# Patient Record
Sex: Female | Born: 1995 | Race: White | Hispanic: No | Marital: Married | Smoking: Never smoker
Health system: Southern US, Community
[De-identification: ages and names within clinical notes are randomized; demographics above are authoritative.]

## PROBLEM LIST (undated history)

## (undated) ENCOUNTER — Inpatient Hospital Stay: Payer: Self-pay

## (undated) DIAGNOSIS — R519 Headache, unspecified: Secondary | ICD-10-CM

## (undated) DIAGNOSIS — B9689 Other specified bacterial agents as the cause of diseases classified elsewhere: Secondary | ICD-10-CM

## (undated) DIAGNOSIS — R102 Pelvic and perineal pain: Secondary | ICD-10-CM

## (undated) DIAGNOSIS — D649 Anemia, unspecified: Secondary | ICD-10-CM

## (undated) DIAGNOSIS — N83209 Unspecified ovarian cyst, unspecified side: Secondary | ICD-10-CM

## (undated) DIAGNOSIS — G8929 Other chronic pain: Secondary | ICD-10-CM

## (undated) DIAGNOSIS — N941 Unspecified dyspareunia: Secondary | ICD-10-CM

## (undated) DIAGNOSIS — R51 Headache: Secondary | ICD-10-CM

## (undated) DIAGNOSIS — N760 Acute vaginitis: Secondary | ICD-10-CM

## (undated) HISTORY — DX: Unspecified dyspareunia: N94.10

## (undated) HISTORY — PX: WISDOM TOOTH EXTRACTION: SHX21

## (undated) HISTORY — DX: Other specified bacterial agents as the cause of diseases classified elsewhere: N76.0

## (undated) HISTORY — DX: Other specified bacterial agents as the cause of diseases classified elsewhere: B96.89

---

## 2015-05-01 ENCOUNTER — Emergency Department

## 2015-05-01 ENCOUNTER — Encounter: Payer: Self-pay | Admitting: Emergency Medicine

## 2015-05-01 ENCOUNTER — Emergency Department
Admission: EM | Admit: 2015-05-01 | Discharge: 2015-05-01 | Disposition: A | Discharge status: Home / Self Care | Attending: Emergency Medicine | Admitting: Emergency Medicine

## 2015-05-01 DIAGNOSIS — R1030 Lower abdominal pain, unspecified: Secondary | ICD-10-CM | POA: Diagnosis present

## 2015-05-01 DIAGNOSIS — Z3202 Encounter for pregnancy test, result negative: Secondary | ICD-10-CM | POA: Insufficient documentation

## 2015-05-01 DIAGNOSIS — R102 Pelvic and perineal pain: Secondary | ICD-10-CM

## 2015-05-01 DIAGNOSIS — N760 Acute vaginitis: Secondary | ICD-10-CM | POA: Diagnosis not present

## 2015-05-01 DIAGNOSIS — B9689 Other specified bacterial agents as the cause of diseases classified elsewhere: Secondary | ICD-10-CM

## 2015-05-01 HISTORY — DX: Unspecified ovarian cyst, unspecified side: N83.209

## 2015-05-01 HISTORY — DX: Anemia, unspecified: D64.9

## 2015-05-01 LAB — CHLAMYDIA/NGC RT PCR (ARMC ONLY)
Chlamydia Tr: NOT DETECTED
N gonorrhoeae: NOT DETECTED

## 2015-05-01 LAB — COMPREHENSIVE METABOLIC PANEL
ALT: 14 U/L (ref 14–54)
AST: 20 U/L (ref 15–41)
Albumin: 4.6 g/dL (ref 3.5–5.0)
Alkaline Phosphatase: 57 U/L (ref 38–126)
Anion gap: 7 (ref 5–15)
BUN: 10 mg/dL (ref 6–20)
CO2: 27 mmol/L (ref 22–32)
CREATININE: 0.63 mg/dL (ref 0.44–1.00)
Calcium: 9.2 mg/dL (ref 8.9–10.3)
Chloride: 104 mmol/L (ref 101–111)
GFR calc non Af Amer: >60 mL/min (ref >60)
Glucose, Bld: 87 mg/dL (ref 65–99)
Potassium: 3.7 mmol/L (ref 3.5–5.1)
SODIUM: 138 mmol/L (ref 135–145)
Total Bilirubin: 0.8 mg/dL (ref 0.3–1.2)
Total Protein: 7.6 g/dL (ref 6.5–8.1)

## 2015-05-01 LAB — CBC WITH DIFFERENTIAL/PLATELET
Basophils Absolute: 0 10*3/uL (ref 0–0.1)
Basophils Relative: 0 %
EOS ABS: 0.1 10*3/uL (ref 0–0.7)
Eosinophils Relative: 2 %
HCT: 39.5 % (ref 35.0–47.0)
HEMOGLOBIN: 13 g/dL (ref 12.0–16.0)
LYMPHS ABS: 1.7 10*3/uL (ref 1.0–3.6)
LYMPHS PCT: 22 %
MCH: 25.3 pg — AB (ref 26.0–34.0)
MCHC: 32.9 g/dL (ref 32.0–36.0)
MCV: 76.7 fL — AB (ref 80.0–100.0)
Monocytes Absolute: 0.7 10*3/uL (ref 0.2–0.9)
Monocytes Relative: 9 %
NEUTROS ABS: 5.1 10*3/uL (ref 1.4–6.5)
Neutrophils Relative %: 67 %
PLATELETS: 293 10*3/uL (ref 150–440)
RBC: 5.15 MIL/uL (ref 3.80–5.20)
RDW: 14.4 % (ref 11.5–14.5)
WBC: 7.6 10*3/uL (ref 3.6–11.0)

## 2015-05-01 LAB — URINALYSIS COMPLETE WITH MICROSCOPIC (ARMC ONLY)
Bilirubin Urine: NEGATIVE
Glucose, UA: NEGATIVE mg/dL
Hgb urine dipstick: NEGATIVE
Ketones, ur: NEGATIVE mg/dL
Nitrite: NEGATIVE
Protein, ur: NEGATIVE mg/dL
SPECIFIC GRAVITY, URINE: 1.023 (ref 1.005–1.030)
pH: 5 (ref 5.0–8.0)

## 2015-05-01 LAB — POCT PREGNANCY, URINE: PREG TEST UR: NEGATIVE

## 2015-05-01 LAB — WET PREP, GENITAL
Clue Cells Wet Prep HPF POC: NONE SEEN
TRICH WET PREP: NONE SEEN
Yeast Wet Prep HPF POC: NONE SEEN

## 2015-05-01 LAB — LIPASE, BLOOD: LIPASE: 30 U/L (ref 22–51)

## 2015-05-01 MED ORDER — METRONIDAZOLE 500 MG PO TABS: 500.0000 mg | ORAL_TABLET | Freq: Two times a day (BID) | ORAL | Status: DC

## 2015-05-01 NOTE — Discharge Instructions (Signed)
Bacterial Vaginosis Bacterial vaginosis is a vaginal infection that occurs when the normal balance of bacteria in the vagina is disrupted. It results from an overgrowth of certain bacteria. This is the most common vaginal infection in women of childbearing age. Treatment is important to prevent complications, especially in pregnant women, as it can cause a premature delivery. CAUSES  Bacterial vaginosis is caused by an increase in harmful bacteria that are normally present in smaller amounts in the vagina. Several different kinds of bacteria can cause bacterial vaginosis. However, the reason that the condition develops is not fully understood. RISK FACTORS Certain activities or behaviors can put you at an increased risk of developing bacterial vaginosis, including:  Having a new sex partner or multiple sex partners.  Douching.  Using an intrauterine device (IUD) for contraception. Women do not get bacterial vaginosis from toilet seats, bedding, swimming pools, or contact with objects around them. SIGNS AND SYMPTOMS  Some women with bacterial vaginosis have no signs or symptoms. Common symptoms include:  Grey vaginal discharge.  A fishlike odor with discharge, especially after sexual intercourse.  Itching or burning of the vagina and vulva.  Burning or pain with urination. DIAGNOSIS  Your health care provider will take a medical history and examine the vagina for signs of bacterial vaginosis. A sample of vaginal fluid may be taken. Your health care provider will look at this sample under a microscope to check for bacteria and abnormal cells. A vaginal pH test may also be done.  TREATMENT  Bacterial vaginosis may be treated with antibiotic medicines. These may be given in the form of a pill or a vaginal cream. A second round of antibiotics may be prescribed if the condition comes back after treatment.  HOME CARE INSTRUCTIONS   Only take over-the-counter or prescription medicines as  directed by your health care provider.  If antibiotic medicine was prescribed, take it as directed. Make sure you finish it even if you start to feel better.  Do not have sex until treatment is completed.  Tell all sexual partners that you have a vaginal infection. They should see their health care provider and be treated if they have problems, such as a mild rash or itching.  Practice safe sex by using condoms and only having one sex partner. SEEK MEDICAL CARE IF:   Your symptoms are not improving after 3 days of treatment.  You have increased discharge or pain.  You have a fever. MAKE SURE YOU:   Understand these instructions.  Will watch your condition.  Will get help right away if you are not doing well or get worse. FOR MORE INFORMATION  Centers for Disease Control and Prevention, Division of STD Prevention: www.cdc.gov/std American Sexual Health Association (ASHA): www.ashastd.org  Document Released: 10/15/2005 Document Revised: 08/05/2013 Document Reviewed: 05/27/2013 Exit[employer] Patient Information 2015 [employer]. This information is not intended to replace advice given to you by your health care provider. Make sure you discuss any questions you have with your health care provider.  

## 2015-05-01 NOTE — ED Provider Notes (Signed)
[facility] Emergency Department Provider Note  ____________________________________________  Time seen: 12:15 PM  I have reviewed the triage vital signs and the nursing notes.   HISTORY  Chief Complaint Abdominal Pain    HPI Kristen Galloway is a 19 y.o. female who complains of lower abdominal/pelvic pain. She notes she has had this pain for about 2 and half weeks and seems to be getting worse this week. The pain is constant and is crampy in nature. She reports it is mild to moderate. Nothing makes it better. She's never had this before. She is not due to menstruate for another 3 weeks. She has had ovarian cysts before but this feels different and is lasting longer. She denies vaginal discharge. She denies dysuria.     Past Medical History  Diagnosis Date  . Ovarian cyst   . Anemia     There are no active problems to display for this patient.   Past Surgical History  Procedure Laterality Date  . Tonsillectomy      No current outpatient prescriptions on file.  Allergies Cefzil and Prednisone  No family history on file.  Social History History  Substance Use Topics  . Smoking status: Never Smoker   . Smokeless tobacco: Not on file  . Alcohol Use: Yes    Review of Systems  Constitutional: Negative for fever. Eyes: Negative for visual changes. ENT: Negative for sore throat Cardiovascular: Negative for chest pain. Respiratory: Negative for shortness of breath. Gastrointestinal: negative for vomiting and diarrhea. Genitourinary: Negative for dysuria. No vaginal discharge Musculoskeletal: Negative for back pain. Skin: Negative for rash. Neurological: Negative for headaches or focal weakness Psychiatric: No anxiety  10-point ROS otherwise negative.  ____________________________________________   PHYSICAL EXAM:  VITAL SIGNS: ED Triage Vitals  Enc Vitals Group     BP 05/01/15 1127 125/48 mmHg     Pulse Rate 05/01/15 1127 70     Resp 05/01/15 1127 18     Temp 05/01/15 1127 98.4 F (36.9 C)     Temp Source 05/01/15 1127 Oral     SpO2 05/01/15 1127 99 %     Weight 05/01/15 1127 154 lb (69.854 kg)     Height 05/01/15 1127  (1.6 m)     Head Cir --      Peak Flow --      Pain Score 05/01/15 1140 7     Pain Loc --      Pain Edu? --      Excl. in GC? --      Constitutional: Alert and oriented. Well appearing and in no distress. Eyes: Conjunctivae are normal.  ENT   Head: Normocephalic and atraumatic.   Mouth/Throat: Mucous membranes are moist. Cardiovascular: Normal rate, regular rhythm. Normal and symmetric distal pulses are present in all extremities. No murmurs, rubs, or gallops. Respiratory: Normal respiratory effort without tachypnea nor retractions. Breath sounds are clear and equal bilaterally.  Gastrointestinal: Soft and non-tender in all quadrants. No distention. There is no CVA tenderness. Genitourinary: White vaginal discharge, coming from cervical os Musculoskeletal: Nontender with normal range of motion in all extremities. No lower extremity tenderness nor edema. Neurologic:  Normal speech and language. No gross focal neurologic deficits are appreciated. Skin:  Skin is warm, dry and intact. No rash noted. Psychiatric: Mood and affect are normal. Patient exhibits appropriate insight and judgment.  ____________________________________________    LABS (pertinent positives/negatives)  Labs Reviewed  CBC WITH DIFFERENTIAL/PLATELET - Abnormal; Notable for the following:  MCV 76.7 (*)    MCH 25.3 (*)    All other components within normal limits  URINALYSIS COMPLETEWITH MICROSCOPIC ([facility] ONLY) - Abnormal; Notable for the following:    Color, Urine YELLOW (*)    APPearance HAZY (*)    Leukocytes, UA 1+ (*)    Bacteria, UA RARE (*)    Squamous Epithelial / LPF 6-30 (*)    All other components within normal limits  LIPASE, BLOOD  COMPREHENSIVE METABOLIC PANEL  POCT PREGNANCY, URINE   POC URINE PREG, ED    ____________________________________________   EKG None  ____________________________________________    RADIOLOGY I have personally reviewed any xrays that were ordered on this patient:  Ultrasound pelvis pending  ____________________________________________   PROCEDURES  Procedure(s) performed: none  Critical Care performed: none  ____________________________________________   INITIAL IMPRESSION / ASSESSMENT AND PLAN / ED COURSE  Pertinent labs & imaging results that were available during my care of the patient were reviewed by me and considered in my medical decision making (see chart for details).  ----------------------------------------- 3:07 PM on 05/01/2015 -----------------------------------------  Given discharge on pelvic exam that shows numerous white blood cells and lower pelvic cramping  suspicious for cervicitis. Given that discomfort has been for the last 2 weeks we'll obtain ultrasound to look for tubo-ovarian abscess.  ____________________________________________   FINAL CLINICAL IMPRESSION(S) / ED DIAGNOSES  Final diagnoses:  Pelvic pain in female  Bacterial vaginosis     Jene Everyobert Zlata Alcaide, MD 05/01/15 [id number]

## 2015-05-01 NOTE — ED Notes (Signed)
Patient c/o abdominal pain for several weeks. Describes as a cramping pain. Worse on right side. Denies n/v/d. Denies dysuria and urinary frequency. Denies any vaginal discharge.

## 2015-05-25 ENCOUNTER — Encounter
Admission: RE | Admit: 2015-05-25 | Discharge: 2015-05-25 | Disposition: A | Discharge status: Home / Self Care | Source: Ambulatory Visit | Attending: Obstetrics and Gynecology | Admitting: Obstetrics and Gynecology

## 2015-05-25 DIAGNOSIS — Z01812 Encounter for preprocedural laboratory examination: Secondary | ICD-10-CM | POA: Insufficient documentation

## 2015-05-25 HISTORY — DX: Headache: R51

## 2015-05-25 HISTORY — DX: Headache, unspecified: R51.9

## 2015-05-25 LAB — CBC
HCT: 40.3 % (ref 35.0–47.0)
HEMOGLOBIN: 13.4 g/dL (ref 12.0–16.0)
MCH: 25.3 pg — AB (ref 26.0–34.0)
MCHC: 33.2 g/dL (ref 32.0–36.0)
MCV: 76.3 fL — ABNORMAL LOW (ref 80.0–100.0)
Platelets: 284 10*3/uL (ref 150–440)
RBC: 5.29 MIL/uL — AB (ref 3.80–5.20)
RDW: 14.2 % (ref 11.5–14.5)
WBC: 5.3 10*3/uL (ref 3.6–11.0)

## 2015-05-25 LAB — COMPREHENSIVE METABOLIC PANEL
ALT: 14 U/L (ref 14–54)
AST: 22 U/L (ref 15–41)
Albumin: 4.5 g/dL (ref 3.5–5.0)
Alkaline Phosphatase: 60 U/L (ref 38–126)
Anion gap: 8 (ref 5–15)
BUN: 8 mg/dL (ref 6–20)
CHLORIDE: 101 mmol/L (ref 101–111)
CO2: 26 mmol/L (ref 22–32)
Calcium: 9.1 mg/dL (ref 8.9–10.3)
Creatinine, Ser: 0.62 mg/dL (ref 0.44–1.00)
GFR calc Af Amer: >60 mL/min (ref >60)
GLUCOSE: 93 mg/dL (ref 65–99)
Potassium: 3.7 mmol/L (ref 3.5–5.1)
Sodium: 135 mmol/L (ref 135–145)
TOTAL PROTEIN: 7.2 g/dL (ref 6.5–8.1)
Total Bilirubin: 0.5 mg/dL (ref 0.3–1.2)

## 2015-05-25 LAB — TYPE AND SCREEN
ABO/RH(D): O POS
ANTIBODY SCREEN: NEGATIVE

## 2015-05-25 LAB — ABO/RH: ABO/RH(D): O POS

## 2015-05-25 NOTE — Patient Instructions (Signed)
  Your procedure is scheduled on: June 09, 2015 Report to Day Surgery. To find out your arrival time please call [phone] between 1PM - 3PM on June 08, 2015.  Remember: Instructions that are not followed completely may result in serious medical risk, up to and including death, or upon the discretion of your surgeon and anesthesiologist your surgery may need to be rescheduled.    __x__ 1. Do not eat food or drink liquids after midnight. No gum chewing or hard candies.     ___x_ 2. No Alcohol for 24 hours before or after surgery.   ____ 3. Bring all medications with you on the day of surgery if instructed.    _x___ 4. Notify your doctor if there is any change in your medical condition     (cold, fever, infections).     Do not wear jewelry, make-up, hairpins, clips or nail polish.  Do not wear lotions, powders, or perfumes. You may wear deodorant.  Do not shave 48 hours prior to surgery. Men may shave face and neck.  Do not bring valuables to the hospital.    [facility] is not responsible for any belongings or valuables.               Contacts, dentures or bridgework may not be worn into surgery.  Leave your suitcase in the car. After surgery it may be brought to your room.  For patients admitted to the hospital, discharge time is determined by your                treatment team.   Patients discharged the day of surgery will not be allowed to drive home.   Please read over the following fact sheets that you were given:   Surgical Site Infection Prevention   ____ Take these medicines the morning of surgery with A SIP OF WATER:    1.   2.   3.   4.  5.  6.  ____ Fleet Enema (as directed)   _x___ Use CHG Soap as directed  ____ Use inhalers on the day of surgery  ____ Stop metformin 2 days prior to surgery    ____ Take 1/2 of usual insulin dose the night before surgery and none on the morning of surgery.   ____ Stop Coumadin/Plavix/aspirin on   ____ Stop  Anti-inflammatories on    __x__ Stop supplements until after surgery.    ____ Bring C-Pap to the hospital.

## 2015-06-08 DIAGNOSIS — N852 Hypertrophy of uterus: Secondary | ICD-10-CM | POA: Diagnosis not present

## 2015-06-08 DIAGNOSIS — K388 Other specified diseases of appendix: Secondary | ICD-10-CM | POA: Diagnosis not present

## 2015-06-08 DIAGNOSIS — Z800 Family history of malignant neoplasm of digestive organs: Secondary | ICD-10-CM | POA: Diagnosis not present

## 2015-06-08 DIAGNOSIS — G8929 Other chronic pain: Secondary | ICD-10-CM | POA: Diagnosis present

## 2015-06-08 DIAGNOSIS — Z888 Allergy status to other drugs, medicaments and biological substances status: Secondary | ICD-10-CM | POA: Diagnosis not present

## 2015-06-08 DIAGNOSIS — Z833 Family history of diabetes mellitus: Secondary | ICD-10-CM | POA: Diagnosis not present

## 2015-06-08 DIAGNOSIS — Z8489 Family history of other specified conditions: Secondary | ICD-10-CM | POA: Diagnosis not present

## 2015-06-08 DIAGNOSIS — Z8249 Family history of ischemic heart disease and other diseases of the circulatory system: Secondary | ICD-10-CM | POA: Diagnosis not present

## 2015-06-08 DIAGNOSIS — Z881 Allergy status to other antibiotic agents status: Secondary | ICD-10-CM | POA: Diagnosis not present

## 2015-06-08 DIAGNOSIS — R102 Pelvic and perineal pain: Secondary | ICD-10-CM | POA: Diagnosis present

## 2015-06-08 DIAGNOSIS — Z808 Family history of malignant neoplasm of other organs or systems: Secondary | ICD-10-CM | POA: Diagnosis not present

## 2015-06-09 ENCOUNTER — Ambulatory Visit: Admitting: Anesthesiology

## 2015-06-09 ENCOUNTER — Encounter
Admission: RE | Disposition: A | Discharge status: Home / Self Care | Payer: Self-pay | Source: Ambulatory Visit | Attending: Obstetrics and Gynecology

## 2015-06-09 ENCOUNTER — Ambulatory Visit
Admission: RE | Admit: 2015-06-09 | Discharge: 2015-06-09 | Disposition: A | Discharge status: Home / Self Care | Source: Ambulatory Visit | Attending: Obstetrics and Gynecology | Admitting: Obstetrics and Gynecology

## 2015-06-09 ENCOUNTER — Encounter: Payer: Self-pay | Admitting: *Deleted

## 2015-06-09 DIAGNOSIS — G8929 Other chronic pain: Secondary | ICD-10-CM

## 2015-06-09 DIAGNOSIS — Z800 Family history of malignant neoplasm of digestive organs: Secondary | ICD-10-CM | POA: Insufficient documentation

## 2015-06-09 DIAGNOSIS — Z8489 Family history of other specified conditions: Secondary | ICD-10-CM | POA: Insufficient documentation

## 2015-06-09 DIAGNOSIS — Z881 Allergy status to other antibiotic agents status: Secondary | ICD-10-CM | POA: Insufficient documentation

## 2015-06-09 DIAGNOSIS — N852 Hypertrophy of uterus: Secondary | ICD-10-CM | POA: Diagnosis not present

## 2015-06-09 DIAGNOSIS — R102 Pelvic and perineal pain: Secondary | ICD-10-CM | POA: Insufficient documentation

## 2015-06-09 DIAGNOSIS — Z833 Family history of diabetes mellitus: Secondary | ICD-10-CM | POA: Insufficient documentation

## 2015-06-09 DIAGNOSIS — Z808 Family history of malignant neoplasm of other organs or systems: Secondary | ICD-10-CM | POA: Insufficient documentation

## 2015-06-09 DIAGNOSIS — Z888 Allergy status to other drugs, medicaments and biological substances status: Secondary | ICD-10-CM | POA: Insufficient documentation

## 2015-06-09 DIAGNOSIS — Z8249 Family history of ischemic heart disease and other diseases of the circulatory system: Secondary | ICD-10-CM | POA: Insufficient documentation

## 2015-06-09 DIAGNOSIS — K388 Other specified diseases of appendix: Secondary | ICD-10-CM | POA: Insufficient documentation

## 2015-06-09 HISTORY — DX: Other chronic pain: G89.29

## 2015-06-09 HISTORY — DX: Pelvic and perineal pain: R10.2

## 2015-06-09 HISTORY — PX: LAPAROSCOPY: SHX197

## 2015-06-09 LAB — TYPE AND SCREEN
ABO/RH(D): O POS
Antibody Screen: NEGATIVE

## 2015-06-09 LAB — POCT PREGNANCY, URINE: PREG TEST UR: NEGATIVE

## 2015-06-09 SURGERY — LAPAROSCOPY, DIAGNOSTIC
Anesthesia: General

## 2015-06-09 MED ORDER — ACETAMINOPHEN 10 MG/ML IV SOLN
INTRAVENOUS | Status: DC | PRN
  Administered 2015-06-09: 1000 mg via INTRAVENOUS

## 2015-06-09 MED ORDER — IBUPROFEN 600 MG PO TABS: 600.0000 mg | ORAL_TABLET | Freq: Four times a day (QID) | ORAL | Status: DC | PRN

## 2015-06-09 MED ORDER — ONDANSETRON HCL 4 MG/2ML IJ SOLN
INTRAMUSCULAR | Status: DC | PRN
  Administered 2015-06-09: 4 mg via INTRAVENOUS

## 2015-06-09 MED ORDER — LACTATED RINGERS IV SOLN
INTRAVENOUS | Status: DC
  Administered 2015-06-09 (×2): via INTRAVENOUS

## 2015-06-09 MED ORDER — PROMETHAZINE HCL 25 MG/ML IJ SOLN: 6.2500 mg | INTRAMUSCULAR | Status: DC | PRN

## 2015-06-09 MED ORDER — SILVER NITRATE-POT NITRATE 75-25 % EX MISC
CUTANEOUS | Status: DC | PRN
  Administered 2015-06-09: 2

## 2015-06-09 MED ORDER — HYDROCODONE-ACETAMINOPHEN 5-325 MG PO TABS: 1.0000 | ORAL_TABLET | Freq: Four times a day (QID) | ORAL | Status: DC | PRN

## 2015-06-09 MED ORDER — SILVER NITRATE-POT NITRATE 75-25 % EX MISC
CUTANEOUS | Status: AC
  Filled 2015-06-09: qty 2

## 2015-06-09 MED ORDER — ONDANSETRON 8 MG PO TBDP: 8.0000 mg | ORAL_TABLET | Freq: Four times a day (QID) | ORAL | Status: DC | PRN

## 2015-06-09 MED ORDER — LIDOCAINE HCL (CARDIAC) 20 MG/ML IV SOLN
INTRAVENOUS | Status: DC | PRN
  Administered 2015-06-09: 100 mg via INTRAVENOUS

## 2015-06-09 MED ORDER — BUPIVACAINE HCL (PF) 0.5 % IJ SOLN
INTRAMUSCULAR | Status: AC
  Filled 2015-06-09: qty 30

## 2015-06-09 MED ORDER — FAMOTIDINE 20 MG PO TABS
20.0000 mg | ORAL_TABLET | Freq: Once | ORAL | Status: AC
  Administered 2015-06-09: 20 mg via ORAL

## 2015-06-09 MED ORDER — PROPOFOL 10 MG/ML IV BOLUS
INTRAVENOUS | Status: DC | PRN
  Administered 2015-06-09: 30 mg via INTRAVENOUS
  Administered 2015-06-09: 150 mg via INTRAVENOUS
  Administered 2015-06-09: 20 mg via INTRAVENOUS

## 2015-06-09 MED ORDER — FENTANYL CITRATE (PF) 100 MCG/2ML IJ SOLN
INTRAMUSCULAR | Status: AC
  Administered 2015-06-09: 25 ug via INTRAVENOUS
  Filled 2015-06-09: qty 2

## 2015-06-09 MED ORDER — OXYCODONE HCL 5 MG PO TABS
5.0000 mg | ORAL_TABLET | Freq: Once | ORAL | Status: AC | PRN
  Administered 2015-06-09: 5 mg via ORAL

## 2015-06-09 MED ORDER — SUGAMMADEX SODIUM 200 MG/2ML IV SOLN
INTRAVENOUS | Status: DC | PRN
Start: 2015-06-09 — End: 2015-06-09
  Administered 2015-06-09: 140 mg via INTRAVENOUS

## 2015-06-09 MED ORDER — OXYCODONE HCL 5 MG PO TABS
ORAL_TABLET | ORAL | Status: AC
  Filled 2015-06-09: qty 1

## 2015-06-09 MED ORDER — OXYCODONE HCL 5 MG/5ML PO SOLN: 5.0000 mg | Freq: Once | ORAL | Status: AC | PRN

## 2015-06-09 MED ORDER — FENTANYL CITRATE (PF) 100 MCG/2ML IJ SOLN
25.0000 ug | INTRAMUSCULAR | Status: DC | PRN
  Administered 2015-06-09: 100 ug via INTRAVENOUS
  Administered 2015-06-09 (×4): 25 ug via INTRAVENOUS

## 2015-06-09 MED ORDER — ROCURONIUM BROMIDE 100 MG/10ML IV SOLN
INTRAVENOUS | Status: DC | PRN
  Administered 2015-06-09: 20 mg via INTRAVENOUS

## 2015-06-09 MED ORDER — ACETAMINOPHEN 10 MG/ML IV SOLN
INTRAVENOUS | Status: AC
  Filled 2015-06-09: qty 100

## 2015-06-09 MED ORDER — DEXAMETHASONE SODIUM PHOSPHATE 4 MG/ML IJ SOLN
INTRAMUSCULAR | Status: DC | PRN
  Administered 2015-06-09: 10 mg via INTRAVENOUS

## 2015-06-09 MED ORDER — MIDAZOLAM HCL 2 MG/2ML IJ SOLN
INTRAMUSCULAR | Status: DC | PRN
  Administered 2015-06-09: 2 mg via INTRAVENOUS

## 2015-06-09 MED ORDER — SUCCINYLCHOLINE CHLORIDE 20 MG/ML IJ SOLN
INTRAMUSCULAR | Status: DC | PRN
  Administered 2015-06-09: 80 mg via INTRAVENOUS

## 2015-06-09 MED ORDER — KETOROLAC TROMETHAMINE 30 MG/ML IJ SOLN
INTRAMUSCULAR | Status: DC | PRN
  Administered 2015-06-09: 30 mg via INTRAVENOUS

## 2015-06-09 MED ORDER — BUPIVACAINE HCL 0.5 % IJ SOLN
INTRAMUSCULAR | Status: DC | PRN
  Administered 2015-06-09: 10 mL

## 2015-06-09 MED ORDER — METHYLENE BLUE 1 % INJ SOLN
INTRAMUSCULAR | Status: AC
  Filled 2015-06-09: qty 10

## 2015-06-09 MED ORDER — FAMOTIDINE 20 MG PO TABS
ORAL_TABLET | ORAL | Status: AC
  Filled 2015-06-09: qty 1

## 2015-06-09 SURGICAL SUPPLY — 38 items
BLADE SURG SZ11 CARB STEEL (BLADE) ×2 IMPLANT
CANISTER SUCT 1200ML W/VALVE (MISCELLANEOUS) ×2 IMPLANT
CHLORAPREP W/TINT 26ML (MISCELLANEOUS) ×2 IMPLANT
DRAPE LEGGINS SURG 28X43 STRL (DRAPES) ×2 IMPLANT
DRAPE SHEET LG 3/4 BI-LAMINATE (DRAPES) ×2 IMPLANT
DRAPE UNDER BUTTOCK W/FLU (DRAPES) ×2 IMPLANT
GLOVE BIO SURGEON STRL SZ7 (GLOVE) ×4 IMPLANT
GLOVE BIOGEL PI IND STRL 7.5 (GLOVE) ×2 IMPLANT
GLOVE BIOGEL PI INDICATOR 7.5 (GLOVE) ×2
GOWN STRL REUS W/ TWL LRG LVL3 (GOWN DISPOSABLE) ×2 IMPLANT
GOWN STRL REUS W/TWL LRG LVL3 (GOWN DISPOSABLE) ×2
IRRIGATION STRYKERFLOW (MISCELLANEOUS) ×1 IMPLANT
IRRIGATOR STRYKERFLOW (MISCELLANEOUS) ×2
IV LACTATED RINGERS 1000ML (IV SOLUTION) ×2 IMPLANT
JELLY LUB 2OZ STRL (MISCELLANEOUS) ×1
JELLY LUBE 2OZ STRL (MISCELLANEOUS) ×1 IMPLANT
KIT RM TURNOVER CYSTO AR (KITS) ×2 IMPLANT
LABEL OR SOLS (LABEL) ×2 IMPLANT
LIQUID BAND (GAUZE/BANDAGES/DRESSINGS) ×2 IMPLANT
NDL SAFETY 22GX1.5 (NEEDLE) ×2 IMPLANT
NS IRRIG 500ML POUR BTL (IV SOLUTION) ×2 IMPLANT
PACK LAP CHOLECYSTECTOMY (MISCELLANEOUS) ×2 IMPLANT
PAD GROUND ADULT SPLIT (MISCELLANEOUS) ×2 IMPLANT
PAD OB MATERNITY 4.3X12.25 (PERSONAL CARE ITEMS) ×2 IMPLANT
PAD PREP 24X41 OB/GYN DISP (PERSONAL CARE ITEMS) ×2 IMPLANT
SCISSORS METZENBAUM CVD 33 (INSTRUMENTS) ×2 IMPLANT
SHEARS HARMONIC ACE PLUS 36CM (ENDOMECHANICALS) IMPLANT
SLEEVE ENDOPATH XCEL 5M (ENDOMECHANICALS) ×2 IMPLANT
SOL PREP PVP 2OZ (MISCELLANEOUS) ×2
SOLUTION PREP PVP 2OZ (MISCELLANEOUS) ×1 IMPLANT
SUT MNCRL 4-0 (SUTURE)
SUT MNCRL 4-0 27XMFL (SUTURE)
SUT VIC AB 2-0 UR6 27 (SUTURE) IMPLANT
SUTURE MNCRL 4-0 27XMF (SUTURE) IMPLANT
TROCAR ENDO BLADELESS 11MM (ENDOMECHANICALS) ×2 IMPLANT
TROCAR XCEL NON-BLD 5MMX100MML (ENDOMECHANICALS) ×2 IMPLANT
TROCAR XCEL UNIV SLVE 11M 100M (ENDOMECHANICALS) IMPLANT
TUBING INSUFFLATOR HI FLOW (MISCELLANEOUS) ×4 IMPLANT

## 2015-06-09 NOTE — Discharge Instructions (Signed)

## 2015-06-09 NOTE — Op Note (Signed)
Operative Report  Pre-Op Diagnosis: Chronic pelvic pain  Post-Op Diagnosis: Chronic pelvic pain  Procedures: Diagnostic laparoscopy  Primary Surgeon: Dr. Thomasene Mohair   EBL: 2 ml   IVF: 700 mL   Urine output: 500 mL clear urine at the end of the procedure.  Specimens: None  Drains: None  Complications: None   Disposition: PACU   Condition: Stable   Findings:  1) normal-appearing fallopian tubes and ovaries 2) slightly enlarged globular uterus 3) no evidence of endometriosis 4) normal-appearing appendix and gallbladder  Procedure Summary:  The patient was taken to the operating room where general anesthesia was administered and found to be adequate. She was placed in the dorsal supine lithotomy position in [place] stirrups and prepped and draped in usual sterile fashion. After a timeout was called an indwelling catheter was placed in her bladder. A sterile speculum was placed in the vagina and a single-tooth tenaculum was used to grasp the anterior lip of the cervix. An acorn uterine manipulator was affixed to the tenaculum. The speculum was removed from the vagina.  Attention was turned to the abdomen where after injection of local anesthetic, a 5 mm infraumbilical incision was made with the scalpel. Entry into the abdomen was obtained via Optiview trocar technique (a blunt entry technique with camera visualization through the obturator upon entry). Verification of entry into the abdomen was obtained using opening pressures. The abdomen was insufflated with CO2. The camera was introduced through the trocar with verification of atraumatic entry. A general inspection of the abdomen and pelvis was undertaken and a 5 mm suprapubic port was placed under direct intra-abdominal camera visualization without difficulty.  Attempt to fully view the appendix was undertaken however it is retrocecal. There were some filmy adhesions of the appendix to the right sidewall which were taken down  sharply, however the entire appendix was not able to be visualized. No other obvious inflammatory process was evident. The entire pelvis was inspected and no obvious endometriosis lesions were discovered. There was mild distortion of pelvic anatomy with the right ovary with a filmy adhesion to the right pelvic sidewall which was taken down with sharp dissection. Thorough inspection of fallopian tubes and ovaries as well as the cul-de-sac both posterior and anterior were undertaken with no significant findings noted. This was decided to be the termination point of the procedure. The abdomen was desufflated with 5 deep breaths given by anesthesia to attempt to remove as much CO2 from the abdomen as possible. Both trochars were removed without difficulty. The skin was closed using surgical skin glue. A total of 10 mL's of 0.5% sensorcain plain was injected at both incision sites.  Attention was turned to the pelvis where the catheter was removed and a speculum was placed the vagina and the tenaculum along with the manipulator were removed. Silver nitrate was applied to the entry sites of the tenaculum to assure hemostasis. The abdomen the vagina was inspected once again and found to be free of sponges and instrumentation after removal of the speculum.  The patient tolerated the procedure well. Sponge lap and needle counts were correct 2. For VTE prophylaxis she was wearing pneumatic compression stockings during the entire procedure. She was awakened in the operating room and taken to the recovery area in stable condition.  Conard Novak, MD, FACOG 06/09/2015 11:20 AM

## 2015-06-09 NOTE — Anesthesia Procedure Notes (Addendum)
Procedure Name: Intubation Date/Time: 06/09/2015 10:12 AM Performed by: Rosaria Ferries Pre-anesthesia Checklist: Patient identified, Patient being monitored, Timeout performed, Emergency Drugs available and Suction available Patient Re-evaluated:Patient Re-evaluated prior to inductionOxygen Delivery Method: Circle system utilized Preoxygenation: Pre-oxygenation with 100% oxygen Intubation Type: IV induction Ventilation: Mask ventilation without difficulty Laryngoscope Size: Mac and 3 Grade View: Grade I Tube type: Oral Tube size: 7.0 mm Number of attempts: 1 Airway Equipment and Method: Stylet Placement Confirmation: ETT inserted through vocal cords under direct vision,  positive ETCO2 and breath sounds checked- equal and bilateral Secured at: 21 cm Tube secured with: Tape Dental Injury: Teeth and Oropharynx as per pre-operative assessment

## 2015-06-09 NOTE — Anesthesia Preprocedure Evaluation (Signed)
Anesthesia Evaluation  Patient identified by MRN, date of birth, ID band Patient awake    Reviewed: Allergy & Precautions, H&P , NPO status , Patient's Chart, lab work & pertinent test results  History of Anesthesia Complications (+) PONV and history of anesthetic complications  Airway Mallampati: II  TM Distance: >3 FB Neck ROM: full    Dental no notable dental hx. (+) Teeth Intact   Pulmonary neg pulmonary ROS,  breath sounds clear to auscultation  Pulmonary exam normal       Cardiovascular negative cardio ROS Normal cardiovascular examRhythm:regular Rate:Normal     Neuro/Psych  Headaches, negative psych ROS   GI/Hepatic negative GI ROS, Neg liver ROS,   Endo/Other  negative endocrine ROS  Renal/GU negative Renal ROS  negative genitourinary   Musculoskeletal   Abdominal   Peds  Hematology negative hematology ROS (+)   Anesthesia Other Findings Past Medical History:   Ovarian cyst                                                 Anemia                                                       Headache                                                     Reproductive/Obstetrics negative OB ROS                             Anesthesia Physical Anesthesia Plan  ASA: II  Anesthesia Plan: General ETT   Post-op Pain Management:    Induction:   Airway Management Planned:   Additional Equipment:   Intra-op Plan:   Post-operative Plan:   Informed Consent: I have reviewed the patients History and Physical, chart, labs and discussed the procedure including the risks, benefits and alternatives for the proposed anesthesia with the patient or authorized representative who has indicated his/her understanding and acceptance.   Dental Advisory Given  Plan Discussed with: Anesthesiologist, CRNA and Surgeon  Anesthesia Plan Comments:         Anesthesia Quick Evaluation

## 2015-06-09 NOTE — Transfer of Care (Signed)
Immediate Anesthesia Transfer of Care Note  Patient: Kristen Galloway  Procedure(s) Performed: Procedure(s): LAPAROSCOPY DIAGNOSTIC (N/A)  Patient Location: PACU  Anesthesia Type:General  Level of Consciousness: awake, alert  and oriented  Airway & Oxygen Therapy: Patient Spontanous Breathing  Post-op Assessment: Report given to RN and Post -op Vital signs reviewed and stable  Post vital signs: Reviewed and stable  Last Vitals:  Filed Vitals:   06/09/15 1115  BP: 130/83  Pulse: 82  Temp: 37.2 C  Resp: 13    Complications: No apparent anesthesia complications

## 2015-06-09 NOTE — Progress Notes (Signed)
T&S drawn by lab tech [phone]

## 2015-06-09 NOTE — H&P (Signed)
History and Physical Interval Note:  Kristen Galloway  has presented today for surgery, with the diagnosis of CHRONIC PELVIC PAIN  The various methods of treatment have been discussed with the patient and family. After consideration of risks, benefits and other options for treatment, the patient has consented to  Procedure(s): LAPAROSCOPY DIAGNOSTIC (N/A) as a surgical intervention .  The patient's history has been reviewed, patient examined, no change in status, stable for surgery.  I have reviewed the patient's chart and labs.  Questions were answered to the patient's satisfaction.    The patient does not take a beta blocker and one is not indicated for this surgery.  Conard Novak, MD 06/09/2015 9:27 AM

## 2015-06-10 NOTE — Anesthesia Postprocedure Evaluation (Signed)
  Anesthesia Post-op Note  Patient: Kristen Galloway  Procedure(s) Performed: Procedure(s): LAPAROSCOPY DIAGNOSTIC (N/A)  Anesthesia type:General ETT  Patient location: PACU  Post pain: Pain level controlled  Post assessment: Post-op Vital signs reviewed, Patient's Cardiovascular Status Stable, Respiratory Function Stable, Patent Airway and No signs of Nausea or vomiting  Post vital signs: Reviewed and stable  Last Vitals:  Filed Vitals:   06/09/15 1307  BP: 126/64  Pulse: 74  Temp:   Resp: 16    Level of consciousness: awake, alert  and patient cooperative  Complications: No apparent anesthesia complications

## 2015-06-15 ENCOUNTER — Ambulatory Visit (INDEPENDENT_AMBULATORY_CARE_PROVIDER_SITE_OTHER): Admitting: Physician Assistant

## 2015-06-15 ENCOUNTER — Encounter: Payer: Self-pay | Admitting: Physician Assistant

## 2015-06-15 VITALS — BP 110/64 | HR 84 | Temp 98.3°F | Resp 16 | Ht 64.5 in | Wt 151.4 lb

## 2015-06-15 DIAGNOSIS — Z0000 Encounter for general adult medical examination without abnormal findings: Secondary | ICD-10-CM | POA: Diagnosis not present

## 2015-06-15 DIAGNOSIS — N83209 Unspecified ovarian cyst, unspecified side: Secondary | ICD-10-CM

## 2015-06-15 DIAGNOSIS — E559 Vitamin D deficiency, unspecified: Secondary | ICD-10-CM | POA: Diagnosis not present

## 2015-06-15 DIAGNOSIS — Z800 Family history of malignant neoplasm of digestive organs: Secondary | ICD-10-CM | POA: Diagnosis not present

## 2015-06-15 DIAGNOSIS — G43019 Migraine without aura, intractable, without status migrainosus: Secondary | ICD-10-CM

## 2015-06-15 DIAGNOSIS — N8329 Other ovarian cysts: Secondary | ICD-10-CM

## 2015-06-15 DIAGNOSIS — G43909 Migraine, unspecified, not intractable, without status migrainosus: Secondary | ICD-10-CM | POA: Insufficient documentation

## 2015-06-15 DIAGNOSIS — N852 Hypertrophy of uterus: Secondary | ICD-10-CM

## 2015-06-15 DIAGNOSIS — N809 Endometriosis, unspecified: Secondary | ICD-10-CM | POA: Insufficient documentation

## 2015-06-15 DIAGNOSIS — N920 Excessive and frequent menstruation with regular cycle: Secondary | ICD-10-CM | POA: Diagnosis not present

## 2015-06-15 DIAGNOSIS — D649 Anemia, unspecified: Secondary | ICD-10-CM | POA: Insufficient documentation

## 2015-06-15 HISTORY — DX: Unspecified ovarian cyst, unspecified side: N83.209

## 2015-06-15 NOTE — Progress Notes (Signed)
Patient ID: Kristen Galloway, female   DOB: 12-11-1995, 19 y.o.   MRN: [id number] Patient: Kristen Galloway, Female    DOB: Mar 09, 1996, 19 y.o.   MRN: [id number] Visit Date: 06/15/2015  Today's Provider: Margaretann Loveless, PA-C   Chief Complaint  Patient presents with  . Establish Care   Subjective:  Kristen Galloway is a 19 y.o. female who presents today for health maintenance and complete physical. She feels well. She reports exercising occasionally. She reports she is sleeping well.  She recently underwent laparoscopic removal of endometriosis and was found to have an enlarged uterus.  She is followed by Dr. Jean Rosenthal at [facility]. She also has history of colon cancer in he rpaternal grandmother who passed away from colon cancer at the age of 55.  No family history of breast cancer.  She has also had history of ovarian cyst rupture.  She has vitamin D def and anemia due to menorrhagia.   Review of Systems  Constitutional: Positive for fatigue.  HENT: Negative.   Eyes: Negative.   Respiratory: Negative.   Cardiovascular: Negative.   Gastrointestinal: Negative.   Endocrine: Negative.   Genitourinary: Positive for pelvic pain.  Musculoskeletal: Negative.   Skin: Negative.   Allergic/Immunologic: Negative.   Neurological: Negative.   Hematological: Negative.   Psychiatric/Behavioral: Negative.     Social History   Social History  . Marital Status: Single    Spouse Name: N/A  . Number of Children: N/A  . Years of Education: N/A   Occupational History  . Not on file.   Social History Main Topics  . Smoking status: Never Smoker   . Smokeless tobacco: Never Used  . Alcohol Use: Yes     Comment: occasional  . Drug Use: No  . Sexual Activity: Yes    Birth Control/ Protection: Condom   Other Topics Concern  . Not on file   Social History Narrative    Patient Active Problem List   Diagnosis Date Noted  . Chronic pelvic pain in female 06/09/2015    Past  Surgical History  Procedure Laterality Date  . Laparoscopy N/A 06/09/2015    Procedure: LAPAROSCOPY DIAGNOSTIC;  Surgeon: Conard Novak, MD;  Location: [facility] ORS;  Service: Gynecology;  Laterality: N/A;  . Wisdom tooth extraction      Her family history is not on file.    Outpatient Prescriptions Prior to Visit  Medication Sig Dispense Refill  . HYDROcodone-acetaminophen (NORCO) 5-325 MG per tablet Take 1 tablet by mouth every 6 (six) hours as needed for moderate pain or severe pain. 30 tablet 0  . ibuprofen (ADVIL,MOTRIN) 600 MG tablet Take 1 tablet (600 mg total) by mouth every 6 (six) hours as needed for mild pain. 30 tablet 0  . Multiple Vitamin (MULTIVITAMIN) tablet Take 1 tablet by mouth daily.    . ondansetron (ZOFRAN ODT) 8 MG disintegrating tablet Take 1 tablet (8 mg total) by mouth every 6 (six) hours as needed for nausea or vomiting. 20 tablet 0  . Probiotic Product (PROBIOTIC FORMULA PO) Take 1 tablet by mouth daily.    . vitamin C (ASCORBIC ACID) 500 MG tablet Take 500 mg by mouth daily.     No facility-administered medications prior to visit.    Patient Care Team: Margaretann Loveless, PA-C as PCP - General (Physician Assistant)     Objective:   Vitals:  Filed Vitals:   06/15/15 1345  BP: 110/64  Pulse: 84  Temp: 98.3 F (36.8 C)  TempSrc:  Oral  Resp: 16  Height: 5' 4.5" (1.638 m)  Weight: 151 lb 6.4 oz (68.675 kg)  SpO2: 98%    Physical Exam  Constitutional: She is oriented to person, place, and time. She appears well-developed and well-nourished. No distress.  HENT:  Head: Normocephalic and atraumatic.  Right Ear: External ear normal.  Left Ear: External ear normal.  Nose: Nose normal.  Mouth/Throat: Oropharynx is clear and moist. No oropharyngeal exudate.  Eyes: Conjunctivae and EOM are normal. Pupils are equal, round, and reactive to light. Right eye exhibits no discharge. Left eye exhibits no discharge. No scleral icterus.  Neck: Normal range of  motion. Neck supple. No JVD present. No tracheal deviation present. No thyromegaly present.  Cardiovascular: Normal rate, regular rhythm, normal heart sounds and intact distal pulses.  Exam reveals no gallop and no friction rub.   No murmur heard. Pulmonary/Chest: Effort normal and breath sounds normal. No respiratory distress. She has no wheezes. She has no rales. She exhibits no tenderness.  Abdominal: Soft. Bowel sounds are normal. She exhibits no distension and no mass. There is no tenderness. There is no rebound and no guarding.  Surgical incision in umbilicus and suprapubic incision are healing well.  Genitourinary:  Deferred to Dr. Jean Rosenthal at [facility]  Musculoskeletal: Normal range of motion. She exhibits no edema or tenderness.  Lymphadenopathy:    She has no cervical adenopathy.  Neurological: She is alert and oriented to person, place, and time.  Skin: Skin is warm and dry. No rash noted. She is not diaphoretic.  Psychiatric: She has a normal mood and affect. Her behavior is normal. Judgment and thought content normal.  Vitals reviewed.    Depression Screen No flowsheet data found.    Assessment & Plan:     Routine Health Maintenance and Physical Exam  Exercise Activities and Dietary recommendations Goals    None       There is no immunization history on file for this patient.  There are no preventive care reminders to display for this patient.    Discussed health benefits of physical activity, and encouraged her to engage in regular exercise appropriate for her age and condition.   1. Annual physical exam Normal physical exam.  Followed by Dr. Jean Rosenthal for Ob/Gyn care.  2. Endometriosis Recently underwent laparoscopic removal of endometriosis by Dr. Jean Rosenthal.  Strong family history as well.  Personal history of ruptured ovarian cysts, most recent was last year.  Dr. Jean Rosenthal had mentioned wanting to get a pelvic MRI to better evaluate an enlarged uterus  noted during laparoscopy.  We can facilitate order if necessary.  She follows up with Dr. Jean Rosenthal on Friday 06/17/15 and this will be determined then.  3. Family history of colon cancer requiring screening colonoscopy Paternal grandmother passed away at 17 with colon cancer.  Aunts and uncles have only had polyps.  Father's colonoscopy was clear.  Recommended due to her family history and ob/gyn history to have a screening colonoscopy. - Ambulatory referral to Gastroenterology  4. Intractable migraine without aura and without status migrainosus Stable.  Also has "ice pick" and sensitivity headaches.  Normally controlled with excedrin currently.  5. Anemia, unspecified anemia type Found last year on blood work at [facility] due to unknown cause of bruising.  Most likely related to menorrhagia.  Labs were stable with last check at pre-op visit.  Will recheck in 6 months if no further complications in the meantime.  Bruising has improved.  6. Avitaminosis  D Also found on blood work at [facility].  Currently taking a once daily Vit D supp with her MV.  She has previously been on once weekly prior to that.  7. Enlarged uterus Found on laparoscopic removal of endometriosis by Dr. Jean Rosenthal.  Also followed by Dr. Jean Rosenthal.  8. Ruptured ovarian cyst Reported history of a few ruptured ovarian cyst with most recent being during last school year.  Has pain medication in case it happens again.    9. Menorrhagia with regular cycle Stable and followed by Dr. Jean Rosenthal.   ------------------------------------------------------------------------------------------------------------

## 2015-06-15 NOTE — Patient Instructions (Signed)

## 2015-06-22 ENCOUNTER — Telehealth: Payer: Self-pay | Admitting: Gastroenterology

## 2015-06-22 NOTE — Telephone Encounter (Signed)
triage

## 2015-06-24 ENCOUNTER — Other Ambulatory Visit: Payer: Self-pay | Admitting: Physician Assistant

## 2015-06-24 ENCOUNTER — Telehealth: Payer: Self-pay | Admitting: Physician Assistant

## 2015-06-24 DIAGNOSIS — N809 Endometriosis, unspecified: Secondary | ICD-10-CM

## 2015-06-24 DIAGNOSIS — N83209 Unspecified ovarian cyst, unspecified side: Secondary | ICD-10-CM

## 2015-06-24 DIAGNOSIS — N852 Hypertrophy of uterus: Secondary | ICD-10-CM

## 2015-06-24 DIAGNOSIS — R102 Pelvic and perineal pain: Principal | ICD-10-CM

## 2015-06-24 DIAGNOSIS — G8929 Other chronic pain: Secondary | ICD-10-CM

## 2015-06-24 NOTE — Telephone Encounter (Signed)
Please review. Thanks!  

## 2015-06-24 NOTE — Telephone Encounter (Signed)
Discussed with patient.  Advised her to call Dr. Edison Pace office as the pain is the same pain she has been having for a few years.  I advised her that if they do not do anything for her for her to call back and we can discuss options in more detail.

## 2015-06-24 NOTE — Telephone Encounter (Signed)
Pt was referred to Dr. Servando Snare but wasn't able to get an appt until October. Pt stated she is in a lot of pain and wanted to know if there might be a way to get in sooner or if she should get referred to another office. Pt stated that she had some left over hydrocodone 5-325 mg from her pervious surgery and she took one last because she was hurting so bad. Pt stated it help a little but the pain was still intense and the small amount of relief that she did get didn't last long. Pt stated that she isn't really wanted more pain medication she just wants to get an appointment sooner if possible. Pt stated she is sitting down and trying to take it easy so her current pain level is at a 3.  Please advise. Thanks TNP

## 2015-07-01 ENCOUNTER — Other Ambulatory Visit

## 2015-07-02 ENCOUNTER — Ambulatory Visit
Admission: RE | Admit: 2015-07-02 | Discharge: 2015-07-02 | Disposition: A | Discharge status: Home / Self Care | Source: Ambulatory Visit | Attending: Physician Assistant | Admitting: Physician Assistant

## 2015-07-02 DIAGNOSIS — N839 Noninflammatory disorder of ovary, fallopian tube and broad ligament, unspecified: Secondary | ICD-10-CM | POA: Diagnosis not present

## 2015-07-02 DIAGNOSIS — G8929 Other chronic pain: Secondary | ICD-10-CM | POA: Diagnosis present

## 2015-07-02 DIAGNOSIS — N852 Hypertrophy of uterus: Secondary | ICD-10-CM

## 2015-07-02 DIAGNOSIS — R102 Pelvic and perineal pain: Secondary | ICD-10-CM | POA: Insufficient documentation

## 2015-07-02 DIAGNOSIS — N809 Endometriosis, unspecified: Secondary | ICD-10-CM

## 2015-07-02 DIAGNOSIS — R1031 Right lower quadrant pain: Secondary | ICD-10-CM | POA: Diagnosis present

## 2015-07-02 DIAGNOSIS — N83209 Unspecified ovarian cyst, unspecified side: Secondary | ICD-10-CM

## 2015-07-02 MED ORDER — GADOBENATE DIMEGLUMINE 529 MG/ML IV SOLN
15.0000 mL | Freq: Once | INTRAVENOUS | Status: DC | PRN
Start: 2015-07-02 — End: 2015-07-03

## 2015-07-05 ENCOUNTER — Telehealth: Payer: Self-pay | Admitting: Physician Assistant

## 2015-07-05 NOTE — Telephone Encounter (Signed)
Pt called wanting to know if you have gotten the MRI results back yet?  Call back is [phone]  Thanks, Barth Kirks

## 2015-07-06 NOTE — Telephone Encounter (Signed)
Pt has called again wanting text results.  She says she has an appt tomorrow with another physician tomorrow and would like to know the results before then   Thanks teri

## 2015-07-06 NOTE — Telephone Encounter (Signed)
Pt is calling to request MRI results/MW

## 2015-07-07 ENCOUNTER — Telehealth: Payer: Self-pay

## 2015-07-07 ENCOUNTER — Ambulatory Visit (INDEPENDENT_AMBULATORY_CARE_PROVIDER_SITE_OTHER): Admitting: Physician Assistant

## 2015-07-07 ENCOUNTER — Encounter: Payer: Self-pay | Admitting: Physician Assistant

## 2015-07-07 VITALS — BP 112/60 | HR 98 | Temp 98.2°F | Resp 18 | Wt 152.2 lb

## 2015-07-07 DIAGNOSIS — R197 Diarrhea, unspecified: Secondary | ICD-10-CM | POA: Diagnosis not present

## 2015-07-07 DIAGNOSIS — R1031 Right lower quadrant pain: Secondary | ICD-10-CM | POA: Diagnosis not present

## 2015-07-07 DIAGNOSIS — K589 Irritable bowel syndrome without diarrhea: Secondary | ICD-10-CM

## 2015-07-07 DIAGNOSIS — R238 Other skin changes: Secondary | ICD-10-CM

## 2015-07-07 DIAGNOSIS — R6889 Other general symptoms and signs: Secondary | ICD-10-CM | POA: Diagnosis not present

## 2015-07-07 DIAGNOSIS — R233 Spontaneous ecchymoses: Secondary | ICD-10-CM

## 2015-07-07 MED ORDER — AMITRIPTYLINE HCL 75 MG PO TABS: 75.0000 mg | ORAL_TABLET | Freq: Every day | ORAL | Status: DC

## 2015-07-07 NOTE — Telephone Encounter (Signed)
Has appt with me today 07/07/15 at 4 pm.

## 2015-07-07 NOTE — Telephone Encounter (Signed)
Pt advised of MRI results.   Thanks,   -Vernona Rieger

## 2015-07-07 NOTE — Telephone Encounter (Signed)
Pt advised as directed below.  She has an appointment with Urology today.  She also reported that she is having nausea everyday now and her bruising has not improved.   Thanks,   -Vernona Rieger

## 2015-07-07 NOTE — Progress Notes (Signed)
Patient: Kristen Galloway Female    DOB: April 11, 1996   19 y.o.   MRN: [id number] Visit Date: 07/07/2015  Today's Provider: Margaretann Loveless, PA-C   Chief Complaint  Patient presents with  . Abdominal Pain  . Diarrhea   Subjective:    Diarrhea  Chronicity: off and on. The current episode started more than 1 month ago. The problem occurs less than 2 times per day. The problem has been unchanged. The stool consistency is described as watery. The patient states that diarrhea does not awaken her from sleep. Associated symptoms include abdominal pain, bloating and weight loss. Nothing aggravates the symptoms. She has tried change of diet and increased fluids for the symptoms. The treatment provided no relief.  Abdominal Pain This is a chronic (since end of June pain is more frequently) problem. The current episode started more than 1 month ago. The onset quality is gradual. The problem occurs daily. The problem has been unchanged (some days are worst than others). The pain is at a severity of 6/10 (depends; earlier today was between 6-7). Pain severity now: mild-moderate. The quality of the pain is cramping and aching. Associated symptoms include diarrhea, nausea and weight loss. The pain is relieved by bowel movements.  She has been seen previously by Dr. Jean Rosenthal at [facility] for her abdominal pain. 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 She is not currently interested in treatment due to side effects. She also was seen by urology today 07/07/2015. Per her report they did a complete physical exam and took a urine specimen. They are running the urine specimen for a possible UTI but states that this is most unlikely. She is to follow-up with them on an as-needed basis. I did discuss with her the possibility of her pain being caused by interstitial cystitis.  This is something urology may workup in the future. I voiced this concern of interstitial cystitis due to the fact that she has dyspareunia as well as pelvic pain without cause. She does also have an appointment with gastroenterology on 08/02/2015.    Allergies  Allergen Reactions  . Cefzil [Cefprozil] Anaphylaxis  . Prednisone Nausea Only   Previous Medications   HYDROCODONE-ACETAMINOPHEN (NORCO) 5-325 MG PER TABLET    Take 1 tablet by mouth every 6 (six) hours as needed for moderate pain or severe pain.   IBUPROFEN (ADVIL,MOTRIN) 600 MG TABLET    Take 1 tablet (600 mg total) by mouth every 6 (six) hours as needed for mild pain.    Review of Systems  Constitutional: Positive for weight loss.  HENT: Negative.   Respiratory: Negative.   Cardiovascular: Negative.   Gastrointestinal: Positive for nausea, abdominal pain, diarrhea and bloating.  Endocrine: Negative.   Genitourinary: Negative.   Musculoskeletal: Negative.   Skin: Negative.   Allergic/Immunologic: Negative.   Neurological: Negative.   Hematological: Negative.   Psychiatric/Behavioral: Negative.     Social History  Substance Use Topics  . Smoking status: Never Smoker   . Smokeless tobacco: Never Used  . Alcohol Use: Yes     Comment: occasional   Objective:   BP 112/60 mmHg  Pulse 98  Temp(Src) 98.2 F (36.8 C) (Oral)  Resp 18  Wt 152 lb 3.2 oz (69.037 kg)  LMP 06/08/2015  Physical Exam  Constitutional: She appears well-developed and well-nourished. No  distress.  Cardiovascular: Normal rate, regular rhythm and normal heart sounds.  Exam reveals no gallop and no friction rub.   No murmur heard. Pulmonary/Chest: Effort normal and breath sounds normal. No respiratory distress. She has no wheezes. She has no rales.  Abdominal: Soft. Bowel sounds are normal. She exhibits no distension and no mass. There is no hepatosplenomegaly. There is tenderness in the right lower quadrant. There is guarding. There is no  rebound and no CVA tenderness.  Skin: She is not diaphoretic.  Vitals reviewed.       Assessment & Plan:     1. Diarrhea I do feel that it is possible that her abdominal pain and diarrhea may be secondary to IBS. She has been under a lot of stress recently with her medical issues as well as school. The diarrhea has seemed to onset since her diagnosis of ovarian cyst and micro-endometriosis. I discussed different options and diagnosis with her. We agreed to do a trial of amitriptyline as below to see if it relieves any of her symptoms. I did advise her to continue to stay well-hydrated. She is to call the office if she develops any severe side effects or if the medication is ineffective. I will follow-up with her in 4 weeks if she does well. If her symptoms persist even with amitriptyline I will then go forth with stool cultures and O&P for her to have the results when she sees GI on 08/02/2015. - Comprehensive Metabolic Panel (CMET) - amitriptyline (ELAVIL) 75 MG tablet; Take 1 tablet (75 mg total) by mouth at bedtime.  Dispense: 30 tablet; Refill: 1  2. Right lower quadrant abdominal pain Most of her pain is probably in the right lower quadrant. MRI of the pelvis showed no abnormalities in the right lower quadrant. She does have a left ovarian cyst and free fluid from possible ruptured left ovarian cyst on the left lower quadrant. I will treat her with the Elavil to see if the right lower quadrant pain may be secondary to IBS.  3. Easy bruising Previous hemoglobin was within normal limits at 13 prior to her exploratory laparoscopy in August 2016. Since she has started developing bruises that have been taking a long time to heal. I will check a CBC to make sure she has not developed anemia. I will follow-up with her pending results or in 4 weeks. - CBC with Differential - Comprehensive Metabolic Panel (CMET)  4. Cold intolerance I will check her thyroid level due to cold intolerance and easy  bruising. We'll follow-up pending results or in 4 weeks. - TSH  5. IBS (irritable bowel syndrome) See above medical treatment plan for diarrhea. - amitriptyline (ELAVIL) 75 MG tablet; Take 1 tablet (75 mg total) by mouth at bedtime.  Dispense: 30 tablet; Refill: 1       Margaretann Loveless, PA-C  [facility]

## 2015-07-07 NOTE — Patient Instructions (Addendum)
Irritable Bowel Syndrome Irritable bowel syndrome (IBS) is caused by a disturbance of normal bowel function and is a common digestive disorder. You may also hear this condition called spastic colon, mucous colitis, and irritable colon. There is no cure for IBS. However, symptoms often gradually improve or disappear with a good diet, stress management, and medicine. This condition usually appears in late adolescence or early adulthood. Women develop it twice as often as men. CAUSES  After food has been digested and absorbed in the small intestine, waste material is moved into the large intestine, or colon. In the colon, water and salts are absorbed from the undigested products coming from the small intestine. The remaining residue, or fecal material, is held for elimination. Under normal circumstances, gentle, rhythmic contractions of the bowel walls push the fecal material along the colon toward the rectum. In IBS, however, these contractions are irregular and poorly coordinated. The fecal material is either retained too long, resulting in constipation, or expelled too soon, producing diarrhea. SIGNS AND SYMPTOMS  The most common symptom of IBS is abdominal pain. It is often in the lower left side of the abdomen, but it may occur anywhere in the abdomen. The pain comes from spasms of the bowel muscles happening too much and from the buildup of gas and fecal material in the colon. This pain:  Can range from sharp abdominal cramps to a dull, continuous ache.  Often worsens soon after eating.  Is often relieved by having a bowel movement or passing gas. Abdominal pain is usually accompanied by constipation, but it may also produce diarrhea. The diarrhea often occurs right after a meal or upon waking up in the morning. The stools are often soft, watery, and flecked with mucus. Other symptoms of IBS include:  Bloating.  Loss of appetite.  Heartburn.  Backache.  Dull pain in the arms or  shoulders.  Nausea.  Burping.  Vomiting.  Gas. IBS may also cause symptoms that are unrelated to the digestive system, such as:  Fatigue.  Headaches.  Anxiety.  Shortness of breath.  Trouble concentrating.  Dizziness. These symptoms tend to come and go. DIAGNOSIS  The symptoms of IBS may seem like symptoms of other, more serious digestive disorders. Your health care provider may want to perform tests to exclude these disorders.  TREATMENT Many medicines are available to help correct bowel function or relieve bowel spasms and abdominal pain. Among the medicines available are:  Laxatives for severe constipation and to help restore normal bowel habits.  Specific antidiarrheal medicines to treat severe or lasting diarrhea.  Antispasmodic agents to relieve intestinal cramps. Your health care provider may also decide to treat you with a mild tranquilizer or sedative during unusually stressful periods in your life. Your health care provider may also prescribe antidepressant medicine. The use of this medicine has been shown to reduce pain and other symptoms of IBS. Remember that if any medicine is prescribed for you, you should take it exactly as directed. Make sure your health care provider knows how well it worked for you. HOME CARE INSTRUCTIONS   Take all medicines as directed by your health care provider.  Avoid foods that are high in fat or oils, such as heavy cream, butter, frankfurters, sausage, and other fatty meats.  Avoid foods that make you go to the bathroom, such as fruit, fruit juice, and dairy products.  Cut out carbonated drinks, chewing gum, and "gassy" foods such as beans and cabbage. This may help relieve bloating and burping.    Eat foods with bran, and drink plenty of liquids with the bran foods. This helps relieve constipation.  Keep track of what foods seem to bring on your symptoms.  Avoid emotionally charged situations or circumstances that produce  anxiety.  Start or continue exercising.  Get plenty of rest and sleep. Document Released: 10/15/2005 Document Revised: 10/20/2013 Document Reviewed: 06/04/2008 [facility] Patient Information 2015 [place], [place]. This information is not intended to replace advice given to you by your health care provider. Make sure you discuss any questions you have with your health care provider.  Diet and Irritable Bowel Syndrome  No cure has been found for irritable bowel syndrome (IBS). Many options are available to treat the symptoms. Your caregiver will give you the best treatments available for your symptoms. He or she will also encourage you to manage stress and to make changes to your diet. You need to work with your caregiver and Registered Dietician to find the best combination of medicine, diet, counseling, and support to control your symptoms. The following are some diet suggestions. FOODS THAT MAKE IBS WORSE  Fatty foods, such as [place] fries.  Milk products, such as cheese or ice cream.  Chocolate.  Alcohol.  Caffeine (found in coffee and some sodas).  Carbonated drinks, such as soda. If certain foods cause symptoms, you should eat less of them or stop eating them. FOOD JOURNAL   Keep a journal of the foods that seem to cause distress. Write down:  What you are eating during the day and when.  What problems you are having after eating.  When the symptoms occur in relation to your meals.  What foods always make you feel badly.  Take your notes with you to your caregiver to see if you should stop eating certain foods. FOODS THAT MAKE IBS BETTER Fiber reduces IBS symptoms, especially constipation, because it makes stools soft, bulky, and easier to pass. Fiber is found in bran, bread, cereal, beans, fruit, and vegetables. Examples of foods with fiber include:  Apples.  Peaches.  Pears.  Berries.  Figs.  Broccoli, raw.  Cabbage.  Carrots.  Raw peas.  Kidney  beans.  Lima beans.  Whole-grain bread.  Whole-grain cereal. Add foods with fiber to your diet a little at a time. This will let your body get used to them. Too much fiber at once might cause gas and swelling of your abdomen. This can trigger symptoms in a person with IBS. Caregivers usually recommend a diet with enough fiber to produce soft, painless bowel movements. High fiber diets may cause gas and bloating. However, these symptoms often go away within a few weeks, as your body adjusts. In many cases, dietary fiber may lessen IBS symptoms, particularly constipation. However, it may not help pain or diarrhea. High fiber diets keep the colon mildly enlarged (distended) with the added fiber. This may help prevent spasms in the colon. Some forms of fiber also keep water in the stool, thereby preventing hard stools that are difficult to pass.  Besides telling you to eat more foods with fiber, your caregiver may also tell you to get more fiber by taking a fiber pill or drinking water mixed with a special high fiber powder. An example of this is a natural fiber laxative containing psyllium seed.  TIPS  Large meals can cause cramping and diarrhea in people with IBS. If this happens to you, try eating 4 or 5 small meals a day, or try eating less at each of your usual 3  meals. It may also help if your meals are low in fat and high in carbohydrates. Examples of carbohydrates are pasta, rice, whole-grain breads and cereals, fruits, and vegetables.  If dairy products cause your symptoms to flare up, you can try eating less of those foods. You might be able to handle yogurt better than other dairy products, because it contains bacteria that helps with digestion. Dairy products are an important source of calcium and other nutrients. If you need to avoid dairy products, be sure to talk with a Registered Dietitian about getting these nutrients through other food sources.  Drink enough water and fluids to keep  your urine clear or pale yellow. This is important, especially if you have diarrhea. FOR MORE INFORMATION  International Foundation for Functional Gastrointestinal Disorders: www.iffgd.org  National Digestive Diseases Information Clearinghouse: digestive.StageSync.si Document Released: 01/05/2004 Document Revised: 01/07/2012 Document Reviewed: 01/15/2014 Amitriptyline tablets What is this medicine? AMITRIPTYLINE (a mee TRIP ti leen) is used to treat depression. This medicine may be used for other purposes; ask your health care provider or pharmacist if you have questions. COMMON BRAND NAME(S): Elavil, Vanatrip What should I tell my health care provider before I take this medicine? They need to know if you have any of these conditions: -an alcohol problem -asthma, difficulty breathing -bipolar disorder or schizophrenia -difficulty passing urine, prostate trouble -glaucoma -heart disease or previous heart attack -liver disease -over active thyroid -seizures -thoughts or plans of suicide, a previous suicide attempt, or family history of suicide attempt -an unusual or allergic reaction to amitriptyline, other medicines, foods, dyes, or preservatives -pregnant or trying to get pregnant -breast-feeding How should I use this medicine? Take this medicine by mouth with a drink of water. Follow the directions on the prescription label. You can take the tablets with or without food. Take your medicine at regular intervals. Do not take it more often than directed. Do not stop taking this medicine suddenly except upon the advice of your doctor. Stopping this medicine too quickly may cause serious side effects or your condition may worsen. A special MedGuide will be given to you by the pharmacist with each prescription and refill. Be sure to read this information carefully each time. Talk to your pediatrician regarding the use of this medicine in children. Special care may be needed. Overdosage: If  you think you have taken too much of this medicine contact a poison control center or emergency room at once. NOTE: This medicine is only for you. Do not share this medicine with others. What if I miss a dose? If you miss a dose, take it as soon as you can. If it is almost time for your next dose, take only that dose. Do not take double or extra doses. What may interact with this medicine? Do not take this medicine with any of the following medications: -arsenic trioxide -certain medicines used to regulate abnormal heartbeat or to treat other heart conditions -cisapride -droperidol -halofantrine -linezolid -MAOIs like Carbex, Eldepryl, Marplan, Nardil, and Parnate -methylene blue -other medicines for mental depression -phenothiazines like perphenazine, thioridazine and chlorpromazine -pimozide -probucol -procarbazine -sparfloxacin -St. John's Wort -ziprasidone This medicine may also interact with the following medications: -atropine and related drugs like hyoscyamine, scopolamine, tolterodine and others -barbiturate medicines for inducing sleep or treating seizures, like phenobarbital -cimetidine -disulfiram -ethchlorvynol -thyroid hormones such as levothyroxine This list may not describe all possible interactions. Give your health care provider a list of all the medicines, herbs, non-prescription drugs, or dietary supplements you use.  Also tell them if you smoke, drink alcohol, or use illegal drugs. Some items may interact with your medicine. What should I watch for while using this medicine? Tell your doctor if your symptoms do not get better or if they get worse. Visit your doctor or health care professional for regular checks on your progress. Because it may take several weeks to see the full effects of this medicine, it is important to continue your treatment as prescribed by your doctor. Patients and their families should watch out for new or worsening thoughts of suicide or  depression. Also watch out for sudden changes in feelings such as feeling anxious, agitated, panicky, irritable, hostile, aggressive, impulsive, severely restless, overly excited and hyperactive, or not being able to sleep. If this happens, especially at the beginning of treatment or after a change in dose, call your health care professional. Bonita Quin may get drowsy or dizzy. Do not drive, use machinery, or do anything that needs mental alertness until you know how this medicine affects you. Do not stand or sit up quickly, especially if you are an older patient. This reduces the risk of dizzy or fainting spells. Alcohol may interfere with the effect of this medicine. Avoid alcoholic drinks. Do not treat yourself for coughs, colds, or allergies without asking your doctor or health care professional for advice. Some ingredients can increase possible side effects. Your mouth may get dry. Chewing sugarless gum or sucking hard candy, and drinking plenty of water will help. Contact your doctor if the problem does not go away or is severe. This medicine may cause dry eyes and blurred vision. If you wear contact lenses you may feel some discomfort. Lubricating drops may help. See your eye doctor if the problem does not go away or is severe. This medicine can cause constipation. Try to have a bowel movement at least every 2 to 3 days. If you do not have a bowel movement for 3 days, call your doctor or health care professional. This medicine can make you more sensitive to the sun. Keep out of the sun. If you cannot avoid being in the sun, wear protective clothing and use sunscreen. Do not use sun lamps or tanning beds/booths. What side effects may I notice from receiving this medicine? Side effects that you should report to your doctor or health care professional as soon as possible: -allergic reactions like skin rash, itching or hives, swelling of the face, lips, or tongue -abnormal production of milk in  females -breast enlargement in both males and females -breathing problems -confusion, hallucinations -fast, irregular heartbeat -fever with increased sweating -muscle stiffness, or spasms -pain or difficulty passing urine, loss of bladder control -seizures -suicidal thoughts or other mood changes -swelling of the testicles -tingling, pain, or numbness in the feet or hands -yellowing of the eyes or skin Side effects that usually do not require medical attention (report to your doctor or health care professional if they continue or are bothersome): -change in sex drive or performance -constipation or diarrhea -nausea, vomiting -weight gain or loss This list may not describe all possible side effects. Call your doctor for medical advice about side effects. You may report side effects to FDA at 1-800-FDA-1088. Where should I keep my medicine? Keep out of the reach of children. Store at room temperature between 20 and 25 degrees C (68 and 77 degrees F). Throw away any unused medicine after the expiration date. NOTE: This sheet is a summary. It may not cover all possible information. If  you have questions about this medicine, talk to your doctor, pharmacist, or health care provider.  2015, Elsevier/Gold Standard. (2012-03-03 13:50:32) [facility] Patient Information [address], [place]. This information is not intended to replace advice given to you by your health care provider. Make sure you discuss any questions you have with your health care provider.

## 2015-07-07 NOTE — Telephone Encounter (Signed)
-----   Message from Margaretann Loveless, PA-C sent at 07/06/2015  9:20 AM EDT ----- Fairly normal pelvic MRI.  Right ovary is WNL.  Uterus is WNL.  There is a cyst in the left ovary that is benign appearing.  There is also some free fluid from a previous ruptured cyst from the left.

## 2015-07-08 ENCOUNTER — Telehealth: Payer: Self-pay | Admitting: Physician Assistant

## 2015-07-08 NOTE — Telephone Encounter (Signed)
Pt called wanting to know if the prescription you gave her is going to treat her IBS or just help her deal with it.  Call back is [phone]  Thanks Barth Kirks

## 2015-07-19 ENCOUNTER — Telehealth: Payer: Self-pay

## 2015-07-19 LAB — CBC WITH DIFFERENTIAL/PLATELET
Basophils Absolute: 0 10*3/uL (ref 0.0–0.2)
Basos: 0 %
EOS (ABSOLUTE): 0.1 10*3/uL (ref 0.0–0.4)
EOS: 1 %
HEMATOCRIT: 38.8 % (ref 34.0–46.6)
HEMOGLOBIN: 12.9 g/dL (ref 11.1–15.9)
IMMATURE GRANULOCYTES: 0 %
Immature Grans (Abs): 0 10*3/uL (ref 0.0–0.1)
Lymphocytes Absolute: 1.7 10*3/uL (ref 0.7–3.1)
Lymphs: 22 %
MCH: 25.6 pg — ABNORMAL LOW (ref 26.6–33.0)
MCHC: 33.2 g/dL (ref 31.5–35.7)
MCV: 77 fL — ABNORMAL LOW (ref 79–97)
Monocytes Absolute: 0.8 10*3/uL (ref 0.1–0.9)
Monocytes: 10 %
NEUTROS PCT: 67 %
Neutrophils Absolute: 5 10*3/uL (ref 1.4–7.0)
Platelets: 344 10*3/uL (ref 150–379)
RBC: 5.03 x10E6/uL (ref 3.77–5.28)
RDW: 15.1 % (ref 12.3–15.4)
WBC: 7.6 10*3/uL (ref 3.4–10.8)

## 2015-07-19 LAB — COMPREHENSIVE METABOLIC PANEL
ALBUMIN: 4.7 g/dL (ref 3.5–5.5)
ALK PHOS: 74 IU/L (ref 39–117)
ALT: 13 IU/L (ref 0–32)
AST: 15 IU/L (ref 0–40)
Albumin/Globulin Ratio: 2 (ref 1.1–2.5)
BUN / CREAT RATIO: 15 (ref 8–20)
BUN: 10 mg/dL (ref 6–20)
Bilirubin Total: 0.9 mg/dL (ref 0.0–1.2)
CO2: 26 mmol/L (ref 18–29)
CREATININE: 0.66 mg/dL (ref 0.57–1.00)
Calcium: 9.4 mg/dL (ref 8.7–10.2)
Chloride: 99 mmol/L (ref 97–108)
GFR calc non Af Amer: 128 mL/min/{1.73_m2} (ref >59)
GFR, EST AFRICAN AMERICAN: 147 mL/min/{1.73_m2} (ref >59)
GLUCOSE: 88 mg/dL (ref 65–99)
Globulin, Total: 2.3 g/dL (ref 1.5–4.5)
Potassium: 4.4 mmol/L (ref 3.5–5.2)
Sodium: 139 mmol/L (ref 134–144)
TOTAL PROTEIN: 7 g/dL (ref 6.0–8.5)

## 2015-07-19 LAB — TSH: TSH: 0.999 u[IU]/mL (ref 0.450–4.500)

## 2015-07-19 NOTE — Telephone Encounter (Signed)
Patient advised as directed below.  Thanks,  -Joseline 

## 2015-07-19 NOTE — Telephone Encounter (Signed)
-----   Message from Margaretann Loveless, PA-C sent at 07/19/2015  8:57 AM EDT ----- All labs are stable and WNL.

## 2015-08-02 ENCOUNTER — Encounter: Payer: Self-pay | Admitting: Gastroenterology

## 2015-08-02 ENCOUNTER — Ambulatory Visit (INDEPENDENT_AMBULATORY_CARE_PROVIDER_SITE_OTHER): Admitting: Gastroenterology

## 2015-08-02 ENCOUNTER — Other Ambulatory Visit: Payer: Self-pay

## 2015-08-02 VITALS — BP 111/72 | HR 92 | Temp 98.4°F | Ht 66.0 in | Wt 150.6 lb

## 2015-08-02 DIAGNOSIS — R109 Unspecified abdominal pain: Secondary | ICD-10-CM

## 2015-08-02 DIAGNOSIS — R197 Diarrhea, unspecified: Secondary | ICD-10-CM | POA: Diagnosis not present

## 2015-08-02 MED ORDER — DICYCLOMINE HCL 10 MG PO CAPS: 10.0000 mg | ORAL_CAPSULE | Freq: Three times a day (TID) | ORAL | Status: DC

## 2015-08-02 NOTE — Progress Notes (Signed)
Gastroenterology Consultation  Referring Provider:     Suella Grove* Primary Care Physician:  Margaretann Loveless, PA-C Primary Gastroenterologist:  Dr. Servando Snare     Reason for Consultation:     Abdominal pain        HPI:   Kristen Galloway is a 19 y.o. y/o female referred for consultation & management of  Abdominal pain by Dr. Margaretann Loveless, PA-C.   This patient comes today with a report of abdominal pain that hurts on both her right and left side. The patient has gone through a workup with multiple blood tests and ultrasounds. The patient also reports that she had a laparoscopy or possible endometreosis. This was reported to be normal. She reports that her pain comes when she is exercising or moving around a lot. She also reports that it is painful during intercourse. There is no association with any eating or drinking. She also reports that when she first have to move her bowels the pain is present and she has urgency but the pain is not made any better or worse after she moves her bowels. He states that the pain can sometimes be on her right-sided then other times of her left side. She is very concerned because she does a lot of exercise and wants to know when she can get back to doing exercise. 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 There is no report of any unexplained weight loss, fevers, chills, nausea or vomiting.  Past Medical History  Diagnosis Date  . Ovarian cyst   . Anemia   . Headache   . Chronic pelvic pain in female 06/09/2015    Past Surgical History  Procedure Laterality Date  . Laparoscopy N/A 06/09/2015    Procedure: LAPAROSCOPY DIAGNOSTIC;  Surgeon: Conard Novak, MD;  Location: [facility] ORS;  Service: Gynecology;  Laterality: N/A;  . Wisdom tooth extraction      Prior to Admission medications   Medication Sig Start Date End Date Taking? Authorizing Provider  ibuprofen (ADVIL,MOTRIN) 600 MG tablet Take 1 tablet (600 mg total) by  mouth every 6 (six) hours as needed for mild pain. 06/09/15  Yes Conard Novak, MD  amitriptyline (ELAVIL) 75 MG tablet Take 1 tablet (75 mg total) by mouth at bedtime. Patient not taking: Reported on 08/02/2015 07/07/15   Margaretann Loveless, PA-C  dicyclomine (BENTYL) 10 MG capsule Take 1 capsule (10 mg total) by mouth 3 (three) times daily before meals. 08/02/15   Midge Minium, MD  HYDROcodone-acetaminophen (NORCO) 5-325 MG per tablet Take 1 tablet by mouth every 6 (six) hours as needed for moderate pain or severe pain. Patient not taking: Reported on 08/02/2015 06/09/15   Conard Novak, MD    Family History  Problem Relation Age of Onset  . Thyroid disease Mother   . Diabetes Other   . Heart disease Other   . Colon cancer Other   . Colon polyps Other   . Cancer Paternal Grandmother      Social History  Substance Use Topics  . Smoking status: Never Smoker   . Smokeless tobacco: Never Used  . Alcohol Use: Yes     Comment: occasional    Allergies as of 08/02/2015 - Review Complete 08/02/2015  Allergen Reaction Noted  . Cefzil [cefprozil] Anaphylaxis 05/01/2015  . Prednisone Nausea Only 05/01/2015    Review of Systems:    All systems reviewed and negative except where noted in HPI.  Physical Exam:  BP 111/72 mmHg  Pulse 92  Temp(Src) 98.4 F (36.9 C) (Oral)  Ht  (1.676 m)  Wt 150 lb 9.6 oz (68.312 kg)  BMI 24.32 kg/m2 No LMP recorded. Psych:  Alert and cooperative. Normal mood and affect. General:   Alert,  Well-developed, well-nourished, pleasant and cooperative in NAD Head:  Normocephalic and atraumatic. Eyes:  Sclera clear, no icterus.   Conjunctiva pink. Ears:  Normal auditory acuity. Nose:  No deformity, discharge, or lesions. Mouth:  No deformity or lesions,oropharynx pink & moist. Neck:  Supple; no masses or thyromegaly. Lungs:  Respirations even and unlabored.  Clear throughout to auscultation.   No wheezes, crackles, or rhonchi. No acute  distress. Heart:  Regular rate and rhythm; no murmurs, clicks, rubs, or gallops. Abdomen:  Normal bowel sounds.  No bruits.  Soft, non-tender and non-distended without masses, hepatosplenomegaly or hernias noted.  No guarding or rebound tenderness.  Positive Carnett sign.   Rectal:  Deferred.  Msk:  Symmetrical without gross deformities.  Good, equal movement & strength bilaterally. Pulses:  Normal pulses noted. Extremities:  No clubbing or edema.  No cyanosis. Neurologic:  Alert and oriented x3;  grossly normal neurologically. Skin:  Intact without significant lesions or rashes.  No jaundice. Lymph Nodes:  No significant cervical adenopathy. Psych:  Alert and cooperative. Normal mood and affect.  Imaging Studies: No results found.  Assessment and Plan:   Kristen Galloway is a 19 y.o. y/o female Who comes in today with abdominal pain that is both on the right and left side of her abdomen. The symptoms are made worse with physical activity and on physical exam is most consistent with musculoskeletal pain. The patient does report that she has some diarrhea that is more frequent than it has been in the past and the diarrhea has gotten worse with her menstrual cycles. The patient will be started on anti-inflammatory medication three times a day with food and she has been given a trial of dicyclomine for possible intestinal spasms. The patient was treated with amitriptyline and states that she could not function the following day after taking it before going to sleep. The patient is also concerned because of her  Grandmother on her father's side who had cancer at 29 years old. She had one episode of bright red blood correct him but has had none prior or since. The patient has been told that if the symptoms do not improve she should contact me and she may need further investigation.   Note: This dictation was prepared with Dragon dictation along with smaller phrase technology. Any transcriptional  errors that result from this process are unintentional.

## 2015-08-04 ENCOUNTER — Ambulatory Visit: Admitting: Physician Assistant

## 2016-01-03 ENCOUNTER — Encounter: Payer: Self-pay | Admitting: Physician Assistant

## 2016-01-03 ENCOUNTER — Ambulatory Visit (INDEPENDENT_AMBULATORY_CARE_PROVIDER_SITE_OTHER): Admitting: Physician Assistant

## 2016-01-03 VITALS — BP 112/70 | HR 96 | Temp 98.6°F | Resp 16 | Wt 153.8 lb

## 2016-01-03 DIAGNOSIS — R05 Cough: Secondary | ICD-10-CM

## 2016-01-03 DIAGNOSIS — R059 Cough, unspecified: Secondary | ICD-10-CM

## 2016-01-03 DIAGNOSIS — J069 Acute upper respiratory infection, unspecified: Secondary | ICD-10-CM

## 2016-01-03 MED ORDER — BENZONATATE 200 MG PO CAPS: 200.0000 mg | ORAL_CAPSULE | Freq: Three times a day (TID) | ORAL | Status: DC | PRN

## 2016-01-03 NOTE — Progress Notes (Signed)
Patient: Kristen Galloway Female    DOB: 1996/06/22   20 y.o.   MRN: [id number] Visit Date: 01/03/2016  Today's Provider: Margaretann Loveless, PA-C   Chief Complaint  Patient presents with  . Cough   Subjective:    Cough This is a new problem. The current episode started in the past 7 days. The problem has been gradually worsening. The cough is non-productive. Associated symptoms include ear congestion, headaches, nasal congestion, postnasal drip, rhinorrhea and shortness of breath. Pertinent negatives include no chest pain, chills, ear pain, fever, heartburn, sore throat or wheezing. The symptoms are aggravated by lying down and exercise (talking). Treatments tried: Mucinex, increased Fluids. The treatment provided no relief.      Allergies  Allergen Reactions  . Cefzil [Cefprozil] Anaphylaxis  . Prednisone Nausea Only   Previous Medications   AMITRIPTYLINE (ELAVIL) 75 MG TABLET    Take 1 tablet (75 mg total) by mouth at bedtime.   DICYCLOMINE (BENTYL) 10 MG CAPSULE    Take 1 capsule (10 mg total) by mouth 3 (three) times daily before meals.   HYDROCODONE-ACETAMINOPHEN (NORCO) 5-325 MG PER TABLET    Take 1 tablet by mouth every 6 (six) hours as needed for moderate pain or severe pain.   IBUPROFEN (ADVIL,MOTRIN) 600 MG TABLET    Take 1 tablet (600 mg total) by mouth every 6 (six) hours as needed for mild pain.   LO LOESTRIN FE 1 MG-10 MCG / 10 MCG TABLET    TK 1 T PO QD    Review of Systems  Constitutional: Negative for fever and chills.  HENT: Positive for congestion, postnasal drip, rhinorrhea and sinus pressure. Negative for ear pain, sneezing, sore throat, tinnitus, trouble swallowing and voice change.   Respiratory: Positive for cough and shortness of breath. Negative for chest tightness and wheezing.   Cardiovascular: Negative for chest pain.  Gastrointestinal: Negative for heartburn, nausea, vomiting and abdominal pain.  Neurological: Positive for headaches.  Negative for dizziness.    Social History  Substance Use Topics  . Smoking status: Never Smoker   . Smokeless tobacco: Never Used  . Alcohol Use: Yes     Comment: occasional   Objective:   BP 112/70 mmHg  Pulse 96  Temp(Src) 98.6 F (37 C) (Oral)  Resp 16  Wt 153 lb 12.8 oz (69.763 kg)  SpO2 98%  LMP 12/08/2015  Physical Exam  Constitutional: She appears well-developed and well-nourished. No distress.  HENT:  Head: Normocephalic and atraumatic.  Right Ear: Hearing, tympanic membrane, external ear and ear canal normal.  Left Ear: Hearing, tympanic membrane, external ear and ear canal normal.  Nose: Mucosal edema and rhinorrhea present. Right sinus exhibits no maxillary sinus tenderness and no frontal sinus tenderness. Left sinus exhibits no maxillary sinus tenderness and no frontal sinus tenderness.  Mouth/Throat: Uvula is midline, oropharynx is clear and moist and mucous membranes are normal. No oropharyngeal exudate, posterior oropharyngeal edema or posterior oropharyngeal erythema.  Eyes: Conjunctivae are normal. Pupils are equal, round, and reactive to light. Right eye exhibits no discharge. Left eye exhibits no discharge. No scleral icterus.  Neck: Normal range of motion. Neck supple. No tracheal deviation present. No thyromegaly present.  Cardiovascular: Normal rate, regular rhythm and normal heart sounds.  Exam reveals no gallop and no friction rub.   No murmur heard. Pulmonary/Chest: Effort normal and breath sounds normal. No stridor. No respiratory distress. She has no wheezes. She has no rales.  Lymphadenopathy:  She has no cervical adenopathy.  Skin: Skin is warm and dry. She is not diaphoretic.  Vitals reviewed.       Assessment & Plan:     1. Upper respiratory infection Symptoms started Saturday night and worsened Sunday night but have stayed the same since Sunday. Most likely viral. Advised to continue Mucinex DM.  May use IBU for pain and fevers. Tessalon  perles given for cough suppression. Increase fluids and try to get plenty of rest. She is to call if symptoms worsen or fail to improve over the next 10 days.  2. Cough See above medical treatment plan. - benzonatate (TESSALON) 200 MG capsule; Take 1 capsule (200 mg total) by mouth 3 (three) times daily as needed for cough.  Dispense: 30 capsule; Refill: 0       Margaretann LovelessJennifer M Hira Trent, PA-C  [facility]

## 2016-01-03 NOTE — Patient Instructions (Signed)
Upper Respiratory Infection, Adult Most upper respiratory infections (URIs) are a viral infection of the air passages leading to the lungs. A URI affects the nose, throat, and upper air passages. The most common type of URI is nasopharyngitis and is typically referred to as "the common cold." URIs run their course and usually go away on their own. Most of the time, a URI does not require medical attention, but sometimes a bacterial infection in the upper airways can follow a viral infection. This is called a secondary infection. Sinus and middle ear infections are common types of secondary upper respiratory infections. Bacterial pneumonia can also complicate a URI. A URI can worsen asthma and chronic obstructive pulmonary disease (COPD). Sometimes, these complications can require emergency medical care and may be life threatening.  CAUSES Almost all URIs are caused by viruses. A virus is a type of germ and can spread from one person to another.  RISKS FACTORS You may be at risk for a URI if:   You smoke.   You have chronic heart or lung disease.  You have a weakened defense (immune) system.   You are very young or very old.   You have nasal allergies or asthma.  You work in crowded or poorly ventilated areas.  You work in health care facilities or schools. SIGNS AND SYMPTOMS  Symptoms typically develop 2-3 days after you come in contact with a cold virus. Most viral URIs last 7-10 days. However, viral URIs from the influenza virus (flu virus) can last 14-18 days and are typically more severe. Symptoms may include:   Runny or stuffy (congested) nose.   Sneezing.   Cough.   Sore throat.   Headache.   Fatigue.   Fever.   Loss of appetite.   Pain in your forehead, behind your eyes, and over your cheekbones (sinus pain).  Muscle aches.  DIAGNOSIS  Your health care provider may diagnose a URI by:  Physical exam.  Tests to check that your symptoms are not due to  another condition such as:  Strep throat.  Sinusitis.  Pneumonia.  Asthma. TREATMENT  A URI goes away on its own with time. It cannot be cured with medicines, but medicines may be prescribed or recommended to relieve symptoms. Medicines may help:  Reduce your fever.  Reduce your cough.  Relieve nasal congestion. HOME CARE INSTRUCTIONS   Take medicines only as directed by your health care provider.   Gargle warm saltwater or take cough drops to comfort your throat as directed by your health care provider.  Use a warm mist humidifier or inhale steam from a shower to increase air moisture. This may make it easier to breathe.  Drink enough fluid to keep your urine clear or pale yellow.   Eat soups and other clear broths and maintain good nutrition.   Rest as needed.   Return to work when your temperature has returned to normal or as your health care provider advises. You may need to stay home longer to avoid infecting others. You can also use a face mask and careful hand washing to prevent spread of the virus.  Increase the usage of your inhaler if you have asthma.   Do not use any tobacco products, including cigarettes, chewing tobacco, or electronic cigarettes. If you need help quitting, ask your health care provider. PREVENTION  The best way to protect yourself from getting a cold is to practice good hygiene.   Avoid oral or hand contact with people with cold   symptoms.   Wash your hands often if contact occurs.  There is no clear evidence that vitamin C, vitamin E, echinacea, or exercise reduces the chance of developing a cold. However, it is always recommended to get plenty of rest, exercise, and practice good nutrition.  SEEK MEDICAL CARE IF:   You are getting worse rather than better.   Your symptoms are not controlled by medicine.   You have chills.  You have worsening shortness of breath.  You have brown or red mucus.  You have yellow or brown nasal  discharge.  You have pain in your face, especially when you bend forward.  You have a fever.  You have swollen neck glands.  You have pain while swallowing.  You have white areas in the back of your throat. SEEK IMMEDIATE MEDICAL CARE IF:   You have severe or persistent:  Headache.  Ear pain.  Sinus pain.  Chest pain.  You have chronic lung disease and any of the following:  Wheezing.  Prolonged cough.  Coughing up blood.  A change in your usual mucus.  You have a stiff neck.  You have changes in your:  Vision.  Hearing.  Thinking.  Mood. MAKE SURE YOU:   Understand these instructions.  Will watch your condition.  Will get help right away if you are not doing well or get worse.   This information is not intended to replace advice given to you by your health care provider. Make sure you discuss any questions you have with your health care provider.   Document Released: 04/10/2001 Document Revised: 03/01/2015 Document Reviewed: 01/20/2014 Elsevier Interactive Patient Education 2016 Elsevier Inc.  

## 2016-01-05 LAB — HM PAP SMEAR: HM Pap smear: NEGATIVE

## 2016-12-05 ENCOUNTER — Ambulatory Visit (INDEPENDENT_AMBULATORY_CARE_PROVIDER_SITE_OTHER): Payer: PRIVATE HEALTH INSURANCE | Admitting: Physician Assistant

## 2016-12-05 ENCOUNTER — Encounter: Payer: Self-pay | Admitting: Physician Assistant

## 2016-12-05 VITALS — BP 122/78 | HR 92 | Temp 99.2°F | Resp 16 | Wt 160.0 lb

## 2016-12-05 DIAGNOSIS — R05 Cough: Secondary | ICD-10-CM

## 2016-12-05 DIAGNOSIS — R509 Fever, unspecified: Secondary | ICD-10-CM | POA: Diagnosis not present

## 2016-12-05 DIAGNOSIS — R059 Cough, unspecified: Secondary | ICD-10-CM

## 2016-12-05 DIAGNOSIS — J101 Influenza due to other identified influenza virus with other respiratory manifestations: Secondary | ICD-10-CM

## 2016-12-05 LAB — POCT INFLUENZA A/B
Influenza A, POC: POSITIVE — AB
Influenza B, POC: NEGATIVE

## 2016-12-05 MED ORDER — OSELTAMIVIR PHOSPHATE 75 MG PO CAPS: 75.0000 mg | ORAL_CAPSULE | Freq: Two times a day (BID) | ORAL | 0 refills | Status: AC

## 2016-12-05 NOTE — Progress Notes (Signed)
[facility]  Chief Complaint  Patient presents with  . URI  . Fever    Highest temp 102.7    Subjective:    Patient ID: Kristen Galloway, female    DOB: 10-Oct-1996, 21 y.o.   MRN: [id number]  Upper Respiratory Infection: Kristen Galloway is a 21 y.o. female  complaining of symptoms of a URI. Symptoms include right ear pain, congestion, cough and fever. Onset of symptoms was 4 days ago, gradually worsening since that time. She also c/o congestion, fever 102.8, lightheadedness, nasal congestion and nausea without vomiting for the past 4 days .  She is drinking plenty of fluids. Evaluation to date:  seen previously and thought to have a viral URI.  Pt was seen at an urgent care over the weekend.  She reports they did not test her for flu at that time.  She was prescribed benzonatate.  Treatment to date: cough suppressants. The treatment has provided no.   Review of Systems  Constitutional: Positive for chills, diaphoresis, fatigue and fever. Negative for activity change, appetite change and unexpected weight change.  HENT: Positive for congestion, ear pain, rhinorrhea, sinus pressure and sore throat. Negative for ear discharge, hearing loss, nosebleeds, postnasal drip, sinus pain, sneezing, tinnitus, trouble swallowing and voice change. Facial swelling: Right ear pain.   Respiratory: Positive for cough, shortness of breath and wheezing. Negative for apnea and chest tightness.   Gastrointestinal: Negative.   Musculoskeletal: Positive for arthralgias and myalgias.  Neurological: Positive for light-headedness and headaches. Negative for dizziness.       Objective:   BP 122/78 (BP Location: Left Arm, Patient Position: Sitting, Cuff Size: Normal)   Pulse 92   Temp 99.2 F (37.3 C) (Oral)   Resp 16   Wt 160 lb (72.6 kg)   LMP 12/03/2016   BMI 25.82 kg/m   Patient Active Problem List   Diagnosis Date Noted  . Endometriosis 06/15/2015  .  Migraines 06/15/2015  . Anemia 06/15/2015  . Avitaminosis D 06/15/2015  . Enlarged uterus 06/15/2015  . Ruptured ovarian cyst 06/15/2015  . Family history of colon cancer requiring screening colonoscopy 06/15/2015  . Menorrhagia with regular cycle 06/15/2015  . Chronic pelvic pain in female 06/09/2015    Outpatient Encounter Prescriptions as of 12/05/2016  Medication Sig Note  . benzonatate (TESSALON) 200 MG capsule Take 1 capsule (200 mg total) by mouth 3 (three) times daily as needed for cough.   . LO LOESTRIN FE 1 MG-10 MCG / 10 MCG tablet TK 1 T PO QD 01/03/2016: Received from: External Pharmacy  . oseltamivir (TAMIFLU) 75 MG capsule Take 1 capsule (75 mg total) by mouth 2 (two) times daily.   . [DISCONTINUED] amitriptyline (ELAVIL) 75 MG tablet Take 1 tablet (75 mg total) by mouth at bedtime. (Patient not taking: Reported on 12/05/2016)   . [DISCONTINUED] dicyclomine (BENTYL) 10 MG capsule Take 1 capsule (10 mg total) by mouth 3 (three) times daily before meals. (Patient not taking: Reported on 01/03/2016)   . [DISCONTINUED] HYDROcodone-acetaminophen (NORCO) 5-325 MG per tablet Take 1 tablet by mouth every 6 (six) hours as needed for moderate pain or severe pain. (Patient not taking: Reported on 08/02/2015)   . [DISCONTINUED] ibuprofen (ADVIL,MOTRIN) 600 MG tablet Take 1 tablet (600 mg total) by mouth every 6 (six) hours as needed for mild pain. (Patient not taking: Reported on 01/03/2016)    No facility-administered encounter medications on file as of 12/05/2016.     Allergies  Allergen  Reactions  . Cefzil [Cefprozil] Anaphylaxis  . Prednisone Nausea Only       Physical Exam  Constitutional: She appears well-developed and well-nourished. She appears ill.  HENT:  Right Ear: External ear normal.  Left Ear: External ear normal.  Mouth/Throat: Mucous membranes are normal. Posterior oropharyngeal edema and posterior oropharyngeal erythema present. No oropharyngeal exudate.  Eyes: Right eye  exhibits discharge. Left eye exhibits discharge.  Neck: Neck supple.  Cardiovascular: Normal rate and regular rhythm.   Pulmonary/Chest: Effort normal and breath sounds normal. No respiratory distress. She has no wheezes. She has no rales.  Lymphadenopathy:    She has no cervical adenopathy.  Skin: Skin is warm and dry.  Psychiatric: She has a normal mood and affect. Her behavior is normal.       Assessment & Plan:   1. Influenza A  Treat as below.  - oseltamivir (TAMIFLU) 75 MG capsule; Take 1 capsule (75 mg total) by mouth 2 (two) times daily.  Dispense: 10 capsule; Refill: 0  2. Fever, unspecified fever cause  - POCT Influenza A/B  3. Cough  - POCT Influenza A/B  Recommend rest, fluids, frequent hand washing. Work note provided  Return if symptoms worsen or fail to improve.   Patient Instructions  Influenza, Adult Influenza ("the flu") is an infection in the lungs, nose, and throat (respiratory tract). It is caused by a virus. The flu causes many common cold symptoms, as well as a high fever and body aches. It can make you feel very sick. The flu spreads easily from person to person (is contagious). Getting a flu shot (influenza vaccination) every year is the best way to prevent the flu. Follow these instructions at home:  Take over-the-counter and prescription medicines only as told by your doctor.  Use a cool mist humidifier to add moisture (humidity) to the air in your home. This can make it easier to breathe.  Rest as needed.  Drink enough fluid to keep your pee (urine) clear or pale yellow.  Cover your mouth and nose when you cough or sneeze.  Wash your hands with soap and water often, especially after you cough or sneeze. If you cannot use soap and water, use hand sanitizer.  Stay home from work or school as told by your doctor. Unless you are visiting your doctor, try to avoid leaving home until your fever has been gone for 24 hours without the use of  medicine.  Keep all follow-up visits as told by your doctor. This is important. How is this prevented?  Getting a yearly (annual) flu shot is the best way to avoid getting the flu. You may get the flu shot in late summer, fall, or winter. Ask your doctor when you should get your flu shot.  Wash your hands often or use hand sanitizer often.  Avoid contact with people who are sick during cold and flu season.  Eat healthy foods.  Drink plenty of fluids.  Get enough sleep.  Exercise regularly. Contact a doctor if:  You get new symptoms.  You have:  Chest pain.  Watery poop (diarrhea).  A fever.  Your cough gets worse.  You start to have more mucus.  You feel sick to your stomach (nauseous).  You throw up (vomit). Get help right away if:  You start to be short of breath or have trouble breathing.  Your skin or nails turn a bluish color.  You have very bad pain or stiffness in your neck.  You  get a sudden headache.  You get sudden pain in your face or ear.  You cannot stop throwing up. This information is not intended to replace advice given to you by your health care provider. Make sure you discuss any questions you have with your health care provider. Document Released: 07/24/2008 Document Revised: 03/22/2016 Document Reviewed: 08/09/2015 Elsevier Interactive Patient Education  2017 [employer]     The entirety of the information documented in the History of Present Illness, Review of Systems and Physical Exam were personally obtained by me. Portions of this information were initially documented by Kavin LeechLaura Versa Craton, CMA and reviewed by me for thoroughness and accuracy.

## 2016-12-05 NOTE — Patient Instructions (Signed)

## 2016-12-28 ENCOUNTER — Telehealth: Payer: Self-pay

## 2016-12-28 NOTE — Telephone Encounter (Signed)
Pt called stating she submitted refill request.  Pharm emailed her stating they were waiting on approval.  She wanted to make sure we were not waiting on her to do something.  I called pharm and gave verbal order for one pack.  Pt aware.  (appt 3/12)

## 2017-01-07 ENCOUNTER — Ambulatory Visit (INDEPENDENT_AMBULATORY_CARE_PROVIDER_SITE_OTHER): Payer: PRIVATE HEALTH INSURANCE | Admitting: Obstetrics & Gynecology

## 2017-01-07 ENCOUNTER — Encounter: Payer: Self-pay | Admitting: Obstetrics & Gynecology

## 2017-01-07 VITALS — BP 110/70 | HR 81 | Ht 64.0 in | Wt 162.0 lb

## 2017-01-07 DIAGNOSIS — Z0000 Encounter for general adult medical examination without abnormal findings: Secondary | ICD-10-CM | POA: Diagnosis not present

## 2017-01-07 DIAGNOSIS — N809 Endometriosis, unspecified: Secondary | ICD-10-CM

## 2017-01-07 MED ORDER — NORTREL 1/35 (28) 1-35 MG-MCG PO TABS: 1.0000 | ORAL_TABLET | Freq: Every day | ORAL | 12 refills | Status: DC

## 2017-01-07 NOTE — Progress Notes (Signed)
HPI:      Kristen Galloway is a 21 y.o. G0P0000 who LMP was Patient's last menstrual period was 12/31/2016., she presents today for her annual examination. The patient has no complaints today. The patient is sexually active.   last pap: was normal  The patient has regular exercise: yes.    GYN History: Contraception: OCP (estrogen/progesterone)  H/o Endometriosis, prior lap.  OCPs maintain cycles and min pain (mild dysmenorrhea and mild dyspareunia)  PMHx: She  has a past medical history of Anemia; Chronic pelvic pain in female (06/09/2015); Headache; and Ovarian cyst. Also,  has a past surgical history that includes laparoscopy (N/A, 06/09/2015) and Wisdom tooth extraction., family history includes Cancer in her paternal grandmother; Colon cancer in her other; Colon polyps in her other; Diabetes in her other; Heart disease in her other; Thyroid disease in her mother.,  reports that she has never smoked. She has never used smokeless tobacco. She reports that she drinks alcohol. She reports that she does not use drugs.  She has a current medication list which includes the following prescription(s): calcium-vitamin d, vitamin c, multi-vitamins, nortrel 1/35 (28), and vitamin b-12. Also, is allergic to cefzil [cefprozil] and prednisone.  Review of Systems  Constitutional: Negative for chills, fever and malaise/fatigue.  HENT: Negative for congestion, sinus pain and sore throat.   Eyes: Negative for blurred vision and pain.  Respiratory: Negative for cough and wheezing.   Cardiovascular: Negative for chest pain and leg swelling.  Gastrointestinal: Negative for abdominal pain, constipation, diarrhea, heartburn, nausea and vomiting.  Genitourinary: Negative for dysuria, frequency, hematuria and urgency.  Musculoskeletal: Negative for back pain, joint pain, myalgias and neck pain.  Skin: Negative for itching and rash.  Neurological: Negative for dizziness, tremors and weakness.    Endo/Heme/Allergies: Does not bruise/bleed easily.  Psychiatric/Behavioral: Negative for depression. The patient is not nervous/anxious and does not have insomnia.     Objective: BP 110/70   Pulse 81   Ht 5\' 4"  (1.626 m)   Wt 162 lb (73.5 kg)   LMP 12/31/2016   BMI 27.81 kg/m  Physical Exam  Constitutional: She is oriented to person, place, and time. She appears well-developed and well-nourished. No distress.  Genitourinary: Rectum normal, vagina normal and uterus normal. Pelvic exam was performed with patient supine. There is no rash or lesion on the right labia. There is no rash or lesion on the left labia. Vagina exhibits no lesion. No bleeding in the vagina. Right adnexum does not display mass and does not display tenderness. Left adnexum does not display mass and does not display tenderness. Cervix does not exhibit motion tenderness, lesion, friability or polyp.   Uterus is mobile and midaxial. Uterus is not enlarged or exhibiting a mass.  HENT:  Head: Normocephalic and atraumatic. Head is without laceration.  Right Ear: Hearing normal.  Left Ear: Hearing normal.  Nose: No epistaxis.  No foreign bodies.  Mouth/Throat: Uvula is midline, oropharynx is clear and moist and mucous membranes are normal.  Eyes: Pupils are equal, round, and reactive to light.  Neck: Normal range of motion. Neck supple. No thyromegaly present.  Cardiovascular: Normal rate and regular rhythm.  Exam reveals no gallop and no friction rub.   No murmur heard. Pulmonary/Chest: Effort normal and breath sounds normal. No respiratory distress. She has no wheezes.  Abdominal: Soft. Bowel sounds are normal. She exhibits no distension. There is no tenderness. There is no rebound.  Musculoskeletal: Normal range of motion.  Neurological: She  is alert and oriented to person, place, and time. No cranial nerve deficit.  Skin: Skin is warm and dry.  Psychiatric: She has a normal mood and affect. Judgment normal.  Vitals  reviewed.   Assessment:  ANNUAL EXAM 1. Annual physical exam      Screening Plan:            1.  Cervical Screening-  Pap smear done today  2. Counseling for contraception: oral contraceptives (estrogen/progesterone)  Other:  1. Annual physical exam  - Pap IG w/ reflex to HPV when ASC-U - NORTREL 1/35, 28, tablet; Take 1 tablet by mouth daily.  Dispense: 1 Package; Refill: 12      F/U  Return in about 1 year (around 01/07/2018) for Annual.  [name], MD, [facility], [facility] 01/07/2017  10:36 AM

## 2017-01-07 NOTE — Patient Instructions (Signed)
Endometriosis Endometriosis is a condition in which the tissue that lines the uterus (endometrium) grows outside of its normal location. The tissue may grow in many locations close to the uterus, but it commonly grows on the ovaries, fallopian tubes, vagina, or bowel. When the uterus sheds the endometrium every menstrual cycle, there is bleeding wherever the endometrial tissue is located. This can cause pain because blood is irritating to tissues that are not normally exposed to it. What are the causes? The cause of endometriosis is not known. What increases the risk? You may be more likely to develop endometriosis if you:  Have a family history of endometriosis.  Have never given birth.  Started your period at age 10 or younger.  Have high levels of estrogen in your body.  Were exposed to a certain medicine (diethylstilbestrol) before you were born (in utero).  Had low birth weight.  Were born as a twin, triplet, or other multiple.  Have a BMI of less than 25. BMI is an estimate of body fat and is calculated from height and weight. What are the signs or symptoms? Often, there are no symptoms of this condition. If you do have symptoms, they may:  Vary depending on where your endometrial tissue is growing.  Occur during your menstrual period (most common) or midcycle.  Come and go, or you may go months with no symptoms at all.  Stop with menopause. Symptoms may include:  Pain in the back or abdomen.  Heavier bleeding during periods.  Pain during sex.  Painful bowel movements.  Infertility.  Pelvic pain.  Bleeding more than once a month. How is this diagnosed? This condition is diagnosed based on your symptoms and a physical exam. You may have tests, such as:  Blood tests and urine tests. These may be done to help rule out other possible causes of your symptoms.  Ultrasound, to look for abnormal tissues.  An X-ray of the lower bowel (barium enema).  An ultrasound  that is done through the vagina (transvaginally).  CT scan.  MRI.  Laparoscopy. In this procedure, a lighted, pencil-sized instrument called a laparoscope is inserted into your abdomen through an incision. The laparoscope allows your health care provider to look at the organs inside your body and check for abnormal tissue to confirm the diagnosis. If abnormal tissue is found, your health care provider may remove a small piece of tissue (biopsy) to be examined under a microscope. How is this treated? Treatment for this condition may include:  Medicines to relieve pain, such as NSAIDs.  Hormone therapy. This involves using artificial (synthetic) hormones to reduce endometrial tissue growth. Your health care provider may recommend using a hormonal form of birth control, or other medicines.  Surgery. This may be done to remove abnormal endometrial tissue.  In some cases, tissue may be removed using a laparoscope and a laser (laparoscopic laser treatment).  In severe cases, surgery may be done to remove the fallopian tubes, uterus, and ovaries (hysterectomy). Follow these instructions at home:  Take over-the-counter and prescription medicines only as told by your health care provider.  Do not drive or use heavy machinery while taking prescription pain medicine.  Try to avoid activities that cause pain, including sexual activity.  Keep all follow-up visits as told by your health care provider. This is important. Contact a health care provider if:  You have pain in the area between your hip bones (pelvic area) that occurs:  Before, during, or after your period.  In   between your period and gets worse during your period.  During or after sex.  With bowel movements or urination, especially during your period.  You have problems getting pregnant.  You have a fever. Get help right away if:  You have severe pain that does not get better with medicine.  You have severe nausea and  vomiting, or you cannot eat without vomiting.  You have pain that affects only the lower, right side of your abdomen.  You have abdominal pain that gets worse.  You have abdominal swelling.  You have blood in your stool. This information is not intended to replace advice given to you by your health care provider. Make sure you discuss any questions you have with your health care provider. Document Released: 10/12/2000 Document Revised: 07/20/2016 Document Reviewed: 03/17/2016 Elsevier Interactive Patient Education  2017 Elsevier Inc.  

## 2017-01-10 LAB — PAP IG W/ RFLX HPV ASCU: PAP SMEAR COMMENT: 0

## 2017-01-16 ENCOUNTER — Encounter: Payer: Self-pay | Admitting: Certified Nurse Midwife

## 2017-01-16 ENCOUNTER — Ambulatory Visit (INDEPENDENT_AMBULATORY_CARE_PROVIDER_SITE_OTHER): Payer: PRIVATE HEALTH INSURANCE | Admitting: Certified Nurse Midwife

## 2017-01-16 VITALS — BP 98/58 | HR 76 | Ht 64.0 in | Wt 161.0 lb

## 2017-01-16 DIAGNOSIS — R350 Frequency of micturition: Secondary | ICD-10-CM | POA: Diagnosis not present

## 2017-01-16 DIAGNOSIS — R300 Dysuria: Secondary | ICD-10-CM

## 2017-01-16 LAB — POCT URINALYSIS DIPSTICK
BILIRUBIN UA: NEGATIVE
GLUCOSE UA: NEGATIVE
KETONES UA: NEGATIVE
Nitrite, UA: NEGATIVE
SPEC GRAV UA: 1.005 (ref 1.030–1.035)
Urobilinogen, UA: NEGATIVE (ref ?–2.0)
pH, UA: 6.5 (ref 5.0–8.0)

## 2017-01-16 MED ORDER — PHENAZOPYRIDINE HCL 200 MG PO TABS: 200.0000 mg | ORAL_TABLET | Freq: Three times a day (TID) | ORAL | 0 refills | Status: DC | PRN

## 2017-01-16 MED ORDER — SULFAMETHOXAZOLE-TRIMETHOPRIM 800-160 MG PO TABS: 1.0000 | ORAL_TABLET | Freq: Two times a day (BID) | ORAL | 0 refills | Status: AC

## 2017-01-16 NOTE — Progress Notes (Addendum)
HPI Kristen Galloway is a 21 year old nulliparous patient who complains of dysuria, frequency, hesitancy, nocturia and voiding small amounts.. She has had symptoms for 4 day. Patient also complains of worsening symptoms since last evening. Patient denies back pain and fever. Patient does not have a history of recurrent UTI. Patient does not have a history of pyelonephritis. Last UTI was 6 years ago.  PMHx: She  has a past medical history of Anemia; Chronic pelvic pain in female (06/09/2015); Headache; and Ovarian cyst. Also,  has a past surgical history that includes laparoscopy (N/A, 06/09/2015) and Wisdom tooth extraction., family history includes Colon cancer in her paternal grandmother; Diabetes in her maternal grandfather and paternal grandfather; Endometriosis in her maternal grandmother and mother; Heart disease in her paternal grandfather; Melanoma in her maternal grandmother.,  reports that she has never smoked. She has never used smokeless tobacco. She reports that she drinks alcohol. She reports that she does not use drugs.  She has a current medication list which includes the following prescription(s): ascorbic acid, ferrous sulfate, probiotic childrens, multi-vitamins, nortrel 1/35 (28), vitamin b-12, calcium-vitamin d, oseltamivir, and phenazopyridine. Also, is allergic to cefzil [cefprozil] and prednisone.  Review of Systems  Constitutional: Negative for chills, fever and malaise/fatigue.  HENT: Negative for congestion, sinus pain and sore throat.   Eyes: Negative for blurred vision and pain.  Respiratory: Negative for cough and wheezing.   Cardiovascular: Negative for chest pain and leg swelling.  Gastrointestinal: Positive for abdominal pain. Negative for constipation, diarrhea, heartburn, nausea and vomiting.       Lower abdominal cramping  Genitourinary: Positive for dysuria, frequency and urgency. Negative for flank pain and hematuria.       Positive for hesitancy and nocturia and voiding  small amts. No vulvar itching or irritation. No unusual vaginal discharge  Musculoskeletal: Negative for back pain, joint pain, myalgias and neck pain.  Skin: Negative for itching and rash.  Neurological: Negative for dizziness, tremors and weakness.  Endo/Heme/Allergies: Does not bruise/bleed easily.  Psychiatric/Behavioral: Negative for depression. The patient is not nervous/anxious and does not have insomnia.     Objective: BP (!) 98/58   Pulse 76   Ht 5\' 4"  (1.626 m)   Wt 73 kg (161 lb)   LMP 12/31/2016 (Exact Date)   BMI 27.64 kg/m  Physical Exam  Constitutional: She is oriented to person, place, and time. She appears well-developed and well-nourished. No distress.  Musculoskeletal: Normal range of motion.  Neurological: She is alert and oriented to person, place, and time.  Skin: Skin is warm and dry.  Psychiatric: She has a normal mood and affect.  Vitals reviewed. Back: no CVAT  Results for orders placed or performed in visit on 01/16/17  Urine culture  Result Value Ref Range   Urine Culture, Routine Final report    Urine Culture result 1 No growth   POCT Urinalysis Dipstick  Result Value Ref Range   Color, UA pale yellow    Clarity, UA clear    Glucose, UA neg    Bilirubin, UA neg    Ketones, UA neg    Spec Grav, UA 1.005 1.030 - 1.035   Blood, UA large    pH, UA 6.5 5.0 - 8.0   Protein, UA trace    Urobilinogen, UA negative Negative - 2.0   Nitrite, UA neg    Leukocytes, UA large (3+) (A) Negative    ASSESSMENT/PLAN:   Acute cystitis  1. Bactrim DS -one BID x 3 days  while awaiting urine culture 2. Pyridium 200 mgm tid prn frequency and dysuria 3. Increase water intake. 4. Will call with results.  Farrel Connersolleen Tyishia Aune, CNM   I

## 2017-01-18 LAB — URINE CULTURE: Organism ID, Bacteria: NO GROWTH

## 2017-01-21 ENCOUNTER — Encounter: Payer: Self-pay | Admitting: Family Medicine

## 2017-01-21 ENCOUNTER — Ambulatory Visit (INDEPENDENT_AMBULATORY_CARE_PROVIDER_SITE_OTHER): Payer: PRIVATE HEALTH INSURANCE | Admitting: Family Medicine

## 2017-01-21 VITALS — BP 122/76 | HR 114 | Temp 98.4°F | Resp 17 | Wt 158.8 lb

## 2017-01-21 DIAGNOSIS — J101 Influenza due to other identified influenza virus with other respiratory manifestations: Secondary | ICD-10-CM | POA: Diagnosis not present

## 2017-01-21 DIAGNOSIS — B349 Viral infection, unspecified: Secondary | ICD-10-CM | POA: Diagnosis not present

## 2017-01-21 LAB — POC INFLUENZA A&B (BINAX/QUICKVUE)
INFLUENZA B, POC: POSITIVE — AB
Influenza A, POC: NEGATIVE

## 2017-01-21 MED ORDER — OSELTAMIVIR PHOSPHATE 75 MG PO CAPS: 75.0000 mg | ORAL_CAPSULE | Freq: Two times a day (BID) | ORAL | 0 refills | Status: DC

## 2017-01-21 NOTE — Progress Notes (Signed)
Subjective:     Patient ID: Kristen Galloway, female   DOB: 1996/10/03, 21 y.o.   MRN: [id number]  HPI  Chief Complaint  Patient presents with  . Sore Throat    Patient comes in office today with concerns of sore throat and congestion for the past 2 days. Patient states associated with sore throat she has fatigue, dry cough, fever high 101.8 and sinus pressure around forehead. Patient states tht she has been exposed to flu virus and mono, she reports she was diagnosed with flu back in Jan. Patient has been taking otc Tylenol, Motrin and Oscillococcinum.  Prior testing in January + for influenza A.   Review of Systems     Objective:   Physical Exam  Constitutional: She appears well-developed and well-nourished. No distress.  Ears: T.M's intact without inflammation Throat: no tonsillar enlargement or exudate Neck: no cervical adenopathy Lungs: clear     Assessment:    1. Viral syndrome - POC Influenza A&B(BINAX/QUICKVUE)  2. Influenza B - oseltamivir (TAMIFLU) 75 MG capsule; Take 1 capsule (75 mg total) by mouth 2 (two) times daily.  Dispense: 10 capsule; Refill: 0    Plan:    Discussed use of Mucinex D for congestion and Delsym for cough.

## 2017-01-21 NOTE — Patient Instructions (Signed)
Discussed use of Mucinex D for congestion and Delsym for cough. 

## 2017-01-23 ENCOUNTER — Telehealth: Payer: Self-pay | Admitting: Physician Assistant

## 2017-01-23 NOTE — Telephone Encounter (Signed)
Kristen Galloway, I have not spoken with the patient myself, and wanted to check with you first if you would want to call in Rx for Kristen Galloway cough.  I would be glad to call Kristen Galloway for you if you would like it.  Thanks ED

## 2017-01-23 NOTE — Telephone Encounter (Signed)
LMTCB ED 

## 2017-01-23 NOTE — Telephone Encounter (Signed)
Discussed use of Delsym for cough at office visit. Cough is the primary flu sx so will take a few days to improve.

## 2017-01-23 NOTE — Telephone Encounter (Signed)
Pt states she was in on Monday and tested positive for the flu.  Pt states she is not any better.  Pt is still have ing a fever in the evening and at night and now she has a cough.  [facility] [address]  [phone]#[phone]

## 2017-01-24 ENCOUNTER — Encounter: Payer: Self-pay | Admitting: Certified Nurse Midwife

## 2017-01-24 NOTE — Addendum Note (Signed)
Addended by: Farrel ConnersGUTIERREZ, Leita Lindbloom on: 01/24/2017 08:40 AM   Modules accepted: Orders

## 2017-01-25 NOTE — Telephone Encounter (Signed)
Patient has been advised, cough and vomiting are improved. KW

## 2017-06-14 ENCOUNTER — Other Ambulatory Visit: Payer: Self-pay | Admitting: Certified Nurse Midwife

## 2017-06-14 DIAGNOSIS — R350 Frequency of micturition: Secondary | ICD-10-CM

## 2017-06-14 DIAGNOSIS — R300 Dysuria: Secondary | ICD-10-CM

## 2017-07-25 ENCOUNTER — Encounter: Payer: Self-pay | Admitting: Physician Assistant

## 2017-07-25 ENCOUNTER — Ambulatory Visit (INDEPENDENT_AMBULATORY_CARE_PROVIDER_SITE_OTHER): Payer: BC Managed Care – PPO | Admitting: Physician Assistant

## 2017-07-25 VITALS — BP 118/64 | HR 108 | Temp 98.9°F | Resp 16 | Wt 156.0 lb

## 2017-07-25 DIAGNOSIS — R509 Fever, unspecified: Secondary | ICD-10-CM | POA: Diagnosis not present

## 2017-07-25 DIAGNOSIS — J069 Acute upper respiratory infection, unspecified: Secondary | ICD-10-CM | POA: Diagnosis not present

## 2017-07-25 LAB — POCT INFLUENZA A/B
Influenza A, POC: NEGATIVE
Influenza B, POC: NEGATIVE

## 2017-07-25 LAB — POCT RAPID STREP A (OFFICE): Rapid Strep A Screen: NEGATIVE

## 2017-07-25 NOTE — Progress Notes (Signed)
Patient: Kristen Galloway Female    DOB: Jul 18, 1996   21 y.o.   MRN: [id number] Visit Date: 07/25/2017  Today's Provider: Trey Sailors, PA-C   Chief Complaint  Patient presents with  . Sore Throat   Subjective:    Sore Throat   This is a new problem. The current episode started yesterday. The problem has been gradually worsening. Neither side of throat is experiencing more pain than the other. The maximum temperature recorded prior to her arrival was 100.4 - 100.9 F. The fever has been present for less than 1 day. Associated symptoms include coughing. Pertinent negatives include no congestion, ear discharge, ear pain, headaches, shortness of breath, stridor or trouble swallowing.       Allergies  Allergen Reactions  . Cefzil [Cefprozil] Anaphylaxis  . Prednisone Nausea Only     Current Outpatient Prescriptions:  .  ascorbic acid (VITAMIN C) 1000 MG tablet, Take 1,000 mg by mouth daily., Disp: , Rfl:  .  calcium-vitamin D (OSCAL WITH D) 500-200 MG-UNIT tablet, Take 1 tablet by mouth., Disp: , Rfl:  .  ferrous sulfate 325 (65 FE) MG EC tablet, Take 325 mg by mouth daily., Disp: , Rfl:  .  Lactobacillus (PROBIOTIC CHILDRENS) CHEW, Chew by mouth., Disp: , Rfl:  .  Multiple Vitamin (MULTI-VITAMINS) TABS, Take by mouth., Disp: , Rfl:  .  NORTREL 1/35, 28, tablet, Take 1 tablet by mouth daily., Disp: 1 Package, Rfl: 12 .  vitamin B-12 (CYANOCOBALAMIN) 1000 MCG tablet, Take by mouth., Disp: , Rfl:   Review of Systems  Constitutional: Positive for fatigue and fever. Negative for activity change, appetite change, chills, diaphoresis and unexpected weight change.  HENT: Positive for sore throat. Negative for congestion, ear discharge, ear pain, mouth sores, nosebleeds, postnasal drip, rhinorrhea, sinus pain, sinus pressure, sneezing, tinnitus, trouble swallowing and voice change.   Eyes: Negative.   Respiratory: Positive for cough. Negative for apnea, choking, chest  tightness, shortness of breath, wheezing and stridor.   Gastrointestinal: Negative.   Neurological: Negative for dizziness, light-headedness and headaches.    Social History  Substance Use Topics  . Smoking status: Never Smoker  . Smokeless tobacco: Never Used  . Alcohol use Yes     Comment: occasional   Objective:   BP 118/64 (BP Location: Right Arm, Patient Position: Sitting, Cuff Size: Normal)   Pulse (!) 108   Temp 98.9 F (37.2 C) (Oral)   Resp 16   Wt 156 lb (70.8 kg)   LMP 07/18/2017   BMI 26.78 kg/m  Vitals:   07/25/17 0915  BP: 118/64  Pulse: (!) 108  Resp: 16  Temp: 98.9 F (37.2 C)  TempSrc: Oral  Weight: 156 lb (70.8 kg)     Physical Exam  Constitutional: She is oriented to person, place, and time. She appears well-developed and well-nourished. She appears ill.  HENT:  Right Ear: External ear normal.  Left Ear: External ear normal.  Mouth/Throat: Posterior oropharyngeal edema and posterior oropharyngeal erythema present. No oropharyngeal exudate.  Neck: Neck supple.  Cardiovascular: Normal rate and regular rhythm.   Pulmonary/Chest: Effort normal and breath sounds normal.  Lymphadenopathy:    She has cervical adenopathy.  Neurological: She is alert and oriented to person, place, and time.  Skin: Skin is warm and dry.  Psychiatric: She has a normal mood and affect. Her behavior is normal.        Assessment & Plan:     1. Viral upper  respiratory illness  Rapid flu and strep negative in office today. Viral illness. Should get flu shot when she feels better.  2. Fever, unspecified fever cause  - POCT rapid strep A - POCT Influenza A/B  Return if symptoms worsen or fail to improve.  The entirety of the information documented in the History of Present Illness, Review of Systems and Physical Exam were personally obtained by me. Portions of this information were initially documenKavin Leechaura Walsh, CMA and reviewed by me for thoroughness and  accuracy.         Trey Sailors, PA-C  [facility]

## 2017-07-25 NOTE — Patient Instructions (Signed)
Upper Respiratory Infection, Adult Most upper respiratory infections (URIs) are caused by a virus. A URI affects the nose, throat, and upper air passages. The most common type of URI is often called "the common cold." Follow these instructions at home:  Take medicines only as told by your doctor.  Gargle warm saltwater or take cough drops to comfort your throat as told by your doctor.  Use a warm mist humidifier or inhale steam from a shower to increase air moisture. This may make it easier to breathe.  Drink enough fluid to keep your pee (urine) clear or pale yellow.  Eat soups and other clear broths.  Have a healthy diet.  Rest as needed.  Go back to work when your fever is gone or your doctor says it is okay. ? You may need to stay home longer to avoid giving your URI to others. ? You can also wear a face mask and wash your hands often to prevent spread of the virus.  Use your inhaler more if you have asthma.  Do not use any tobacco products, including cigarettes, chewing tobacco, or electronic cigarettes. If you need help quitting, ask your doctor. Contact a doctor if:  You are getting worse, not better.  Your symptoms are not helped by medicine.  You have chills.  You are getting more short of breath.  You have brown or red mucus.  You have yellow or brown discharge from your nose.  You have pain in your face, especially when you bend forward.  You have a fever.  You have puffy (swollen) neck glands.  You have pain while swallowing.  You have white areas in the back of your throat. Get help right away if:  You have very bad or constant: ? Headache. ? Ear pain. ? Pain in your forehead, behind your eyes, and over your cheekbones (sinus pain). ? Chest pain.  You have long-lasting (chronic) lung disease and any of the following: ? Wheezing. ? Long-lasting cough. ? Coughing up blood. ? A change in your usual mucus.  You have a stiff neck.  You have  changes in your: ? Vision. ? Hearing. ? Thinking. ? Mood. This information is not intended to replace advice given to you by your health care provider. Make sure you discuss any questions you have with your health care provider. Document Released: 04/02/2008 Document Revised: 06/17/2016 Document Reviewed: 01/20/2014 Elsevier Interactive Patient Education  2018 Elsevier Inc.  

## 2017-07-30 ENCOUNTER — Telehealth: Payer: Self-pay | Admitting: Physician Assistant

## 2017-07-30 DIAGNOSIS — J0110 Acute frontal sinusitis, unspecified: Secondary | ICD-10-CM

## 2017-07-30 MED ORDER — DOXYCYCLINE HYCLATE 100 MG PO TABS: 100.0000 mg | ORAL_TABLET | Freq: Two times a day (BID) | ORAL | 0 refills | Status: DC

## 2017-07-30 NOTE — Telephone Encounter (Signed)
Was seen by Adriana

## 2017-07-30 NOTE — Telephone Encounter (Signed)
Sent in doxycycline 100 mg BID x 10 days for sinusitis. Thanks.

## 2017-07-30 NOTE — Telephone Encounter (Signed)
Pt states she was seen on 07/25/17 for fever, sore throat and body aches.  Pt states she now has sinus congestion, sinus pressure and cough.  Pt is asking if she can get a Rx to help with this.  [facility] [place].  QQ#[phone]/PP

## 2017-07-30 NOTE — Telephone Encounter (Signed)
Left message to call back  

## 2017-09-11 ENCOUNTER — Telehealth: Payer: Self-pay

## 2017-09-11 NOTE — Telephone Encounter (Signed)
Pt state she is currently taking nortrel for birth control and has been experiencing heavy periods and bleeding through a pad and her pants within 2.5 hours. Pt has a family hx of endometriosis and wants to make sure she is on the best medication to manage her periods. She feels like the heavy periods may also be related to stress but wants to make sure medication is working appropriately. Please advise. Cb# [phone]

## 2017-09-12 ENCOUNTER — Other Ambulatory Visit: Payer: Self-pay | Admitting: Obstetrics & Gynecology

## 2017-09-12 DIAGNOSIS — N939 Abnormal uterine and vaginal bleeding, unspecified: Secondary | ICD-10-CM

## 2017-09-12 NOTE — Telephone Encounter (Signed)
Pill is one way but if pattern of bleeding has worsened then may need to change to different pill or different method. Often an Ultrasound helps as well to evaluate for other non hormonal causes of bleeding. Discuss and see if she needs Appt including US for bleeding, or if she just wants to change pill or to Depo/ring/patch as next step.  IUD also an option and can discuss at an appt.

## 2017-09-12 NOTE — Telephone Encounter (Signed)
Pt states she would like to stay on the pill as method due to h/o ovarian cysts and was told this would be best method for her. Pt says heavy periods are normal for her and she has been on this dosage for the past 2 years after needing dose to be increased. Pt willing to do ultrasound if needed but overall concern is that birth control is effective and wants to know if dosage should be changed. Thank you.

## 2017-09-12 NOTE — Telephone Encounter (Signed)
Left msg for pt to call back

## 2017-09-12 NOTE — Telephone Encounter (Signed)
No change in dose needed that would change birth control effectiveness . . . That should be fine. Body may be changing and may different pill to have better bleeding results. US would assess for other causes, esp if pill has been fine for 2 years. I would rec GYN US and then I discuss with her the results and decide upon change in pill.

## 2017-09-12 NOTE — Telephone Encounter (Signed)
ok 

## 2017-09-12 NOTE — Telephone Encounter (Signed)
Pt agrees to GYN ultrasound and discussion regarding birth control. Please place orders for u/s so they are available when pt calls to schedule. Thank you!

## 2017-09-25 ENCOUNTER — Ambulatory Visit: Payer: BC Managed Care – PPO | Admitting: Obstetrics & Gynecology

## 2017-09-25 ENCOUNTER — Encounter: Payer: Self-pay | Admitting: Obstetrics & Gynecology

## 2017-09-25 ENCOUNTER — Ambulatory Visit (INDEPENDENT_AMBULATORY_CARE_PROVIDER_SITE_OTHER): Payer: BC Managed Care – PPO

## 2017-09-25 VITALS — BP 120/70 | HR 74 | Ht 63.0 in | Wt 164.0 lb

## 2017-09-25 DIAGNOSIS — N939 Abnormal uterine and vaginal bleeding, unspecified: Secondary | ICD-10-CM

## 2017-09-25 DIAGNOSIS — N946 Dysmenorrhea, unspecified: Secondary | ICD-10-CM | POA: Diagnosis not present

## 2017-09-25 DIAGNOSIS — N920 Excessive and frequent menstruation with regular cycle: Secondary | ICD-10-CM | POA: Diagnosis not present

## 2017-09-25 NOTE — Progress Notes (Signed)
  HPI: Dysfunctional Uterine Bleeding Patient complains of worsening heavy menses. She had been bleeding regularly. She is now bleeding every 28 days and menses are lasting 6-8 days. She changes her pad or tampon every 1 hours. Clots are med in size. Dysmenorrhea:moderate, occurring throughout menses. Cyclic symptoms include: bloating and breast tenderness. Current contraception: OCP (estrogen/progesterone). History of infertility: no. History of abnormal Pap smear: no.  Ultrasound demonstrates no masses seen, no cysts These findings are Pelvis normal  PMHx: She  has a past medical history of Anemia, Chronic pelvic pain in female (06/09/2015), Headache, and Ovarian cyst. Also,  has a past surgical history that includes laparoscopy (N/A, 06/09/2015) and Wisdom tooth extraction., family history includes Colon cancer in her paternal grandmother; Diabetes in her maternal grandfather and paternal grandfather; Endometriosis in her maternal grandmother and mother; Heart disease in her paternal grandfather; Melanoma in her maternal grandmother.,  reports that  has never smoked. she has never used smokeless tobacco. She reports that she drinks alcohol. She reports that she does not use drugs.  She has a current medication list which includes the following prescription(s): ascorbic acid, calcium-vitamin d, doxycycline, ferrous sulfate, probiotic childrens, multi-vitamins, nortrel 1/35 (28), and vitamin b-12. Also, is allergic to cefzil [cefprozil] and prednisone.  Review of Systems  Constitutional: Negative for chills, fever and malaise/fatigue.  HENT: Negative for congestion, sinus pain and sore throat.   Eyes: Negative for blurred vision and pain.  Respiratory: Negative for cough and wheezing.   Cardiovascular: Negative for chest pain and leg swelling.  Gastrointestinal: Negative for abdominal pain, constipation, diarrhea, heartburn, nausea and vomiting.  Genitourinary: Negative for dysuria, frequency,  hematuria and urgency.  Musculoskeletal: Negative for back pain, joint pain, myalgias and neck pain.  Skin: Negative for itching and rash.  Neurological: Negative for dizziness, tremors and weakness.  Endo/Heme/Allergies: Does not bruise/bleed easily.  Psychiatric/Behavioral: Negative for depression. The patient is not nervous/anxious and does not have insomnia.    Objective: BP 120/70   Pulse 74   Ht 5\' 3"  (1.6 m)   Wt 164 lb (74.4 kg)   LMP 09/13/2017   BMI 29.05 kg/m   Physical examination Constitutional NAD, Conversant  Skin No rashes, lesions or ulceration.   Extremities: Moves all appropriately.  Normal ROM for age. No lymphadenopathy.  Neuro: Grossly intact  Psych: Oriented to PPT.  Normal mood. Normal affect.   Assessment:  Menorrhagia wth regular cycle- new and unstable despite medical therapy; also has Dysmenorrhea  Patient has abnormal uterine bleeding . She has a normal exam today, with no evidence of lesions.  Evaluation includes the following: exam, labs such as hormonal testing, and pelvic ultrasound to evaluate for any structural gynecologic abnormalities.   Treatment option for menorrhagia or menometrorrhagia discussed in great detail with the patient.  Options include hormonal therapy, IUD therapy such as Mirena, D&C, Ablation, and Hysterectomy.  The pros and cons of each option discussed with patient.  Prefers to stay w OCP and possibly try patch in future.  A total of 30 minutes were spent face-to-face with the patient during this encounter and over half of that time dealt with counseling and coordination of care.  [name] Emelie Newsom, MD, Merlinda FrederickFACOG [facility], [facility] 09/25/2017  4:55 PM

## 2017-10-25 ENCOUNTER — Encounter: Payer: Self-pay | Admitting: Obstetrics & Gynecology

## 2017-11-01 ENCOUNTER — Encounter: Payer: Self-pay | Admitting: Obstetrics & Gynecology

## 2017-11-01 ENCOUNTER — Ambulatory Visit (INDEPENDENT_AMBULATORY_CARE_PROVIDER_SITE_OTHER): Payer: BC Managed Care – PPO | Admitting: Obstetrics & Gynecology

## 2017-11-01 VITALS — BP 100/60 | HR 67 | Ht 63.0 in | Wt 169.0 lb

## 2017-11-01 DIAGNOSIS — Z124 Encounter for screening for malignant neoplasm of cervix: Secondary | ICD-10-CM

## 2017-11-01 DIAGNOSIS — N920 Excessive and frequent menstruation with regular cycle: Secondary | ICD-10-CM

## 2017-11-01 DIAGNOSIS — Z0000 Encounter for general adult medical examination without abnormal findings: Secondary | ICD-10-CM

## 2017-11-01 DIAGNOSIS — Z01419 Encounter for gynecological examination (general) (routine) without abnormal findings: Secondary | ICD-10-CM

## 2017-11-01 MED ORDER — NORTREL 1/35 (28) 1-35 MG-MCG PO TABS: 1.0000 | ORAL_TABLET | Freq: Every day | ORAL | 12 refills | Status: DC

## 2017-11-01 NOTE — Progress Notes (Signed)
HPI:      [name] is a 22 y.o. G0P0000 who LMP was Patient's last menstrual period was 10/11/2017., she presents today for her annual examination. The patient has no complaints today. The patient is sexually active. Her last pap: was normal. The patient does perform self breast exams.  There is no notable family history of breast or ovarian cancer in her family.  The patient has regular exercise: yes.  The patient denies current symptoms of depression.  Heavy periods some months, tolerable at this time.  GYN History: Contraception: OCP (estrogen/progesterone)  PMHx: Past Medical History:  Diagnosis Date  . Anemia   . Bacterial vaginosis   . Chronic pelvic pain in female 06/09/2015  . Dyspareunia in female   . Headache   . Ovarian cyst    Past Surgical History:  Procedure Laterality Date  . LAPAROSCOPY N/A 06/09/2015   Procedure: LAPAROSCOPY DIAGNOSTIC;  Surgeon: Conard Novak, MD;  Location: [facility] ORS;  Service: Gynecology;  Laterality: N/A;  . WISDOM TOOTH EXTRACTION     Family History  Problem Relation Age of Onset  . Endometriosis Mother   . Colon cancer Paternal Grandmother   . Endometriosis Maternal Grandmother   . Melanoma Maternal Grandmother   . Diabetes Maternal Grandfather   . Diabetes Paternal Grandfather   . Heart disease Paternal Grandfather    Social History   Tobacco Use  . Smoking status: Never Smoker  . Smokeless tobacco: Never Used  Substance Use Topics  . Alcohol use: Yes    Comment: occasional  . Drug use: No    Current Outpatient Medications:  .  ascorbic acid (VITAMIN C) 1000 MG tablet, Take 1,000 mg by mouth daily., Disp: , Rfl:  .  calcium-vitamin D (OSCAL WITH D) 500-200 MG-UNIT tablet, Take 1 tablet by mouth., Disp: , Rfl:  .  doxycycline (VIBRA-TABS) 100 MG tablet, Take 1 tablet (100 mg total) by mouth 2 (two) times daily., Disp: 20 tablet, Rfl: 0 .  ferrous sulfate 325 (65 FE) MG EC tablet, Take 325 mg by mouth daily.,  Disp: , Rfl:  .  Lactobacillus (PROBIOTIC CHILDRENS) CHEW, Chew by mouth., Disp: , Rfl:  .  Multiple Vitamin (MULTI-VITAMINS) TABS, Take by mouth., Disp: , Rfl:  .  NORTREL 1/35, 28, tablet, Take 1 tablet by mouth daily., Disp: 1 Package, Rfl: 12 .  vitamin B-12 (CYANOCOBALAMIN) 1000 MCG tablet, Take by mouth., Disp: , Rfl:  Allergies: Cefzil [cefprozil] and Prednisone  Review of Systems  Constitutional: Negative for chills, fever and malaise/fatigue.  HENT: Negative for congestion, sinus pain and sore throat.   Eyes: Negative for blurred vision and pain.  Respiratory: Negative for cough and wheezing.   Cardiovascular: Negative for chest pain and leg swelling.  Gastrointestinal: Negative for abdominal pain, constipation, diarrhea, heartburn, nausea and vomiting.  Genitourinary: Negative for dysuria, frequency, hematuria and urgency.  Musculoskeletal: Negative for back pain, joint pain, myalgias and neck pain.  Skin: Negative for itching and rash.  Neurological: Negative for dizziness, tremors and weakness.  Endo/Heme/Allergies: Does not bruise/bleed easily.  Psychiatric/Behavioral: Negative for depression. The patient is not nervous/anxious and does not have insomnia.     Objective: BP 100/60   Pulse 67   Ht 5\' 3"  (1.6 m)   Wt 169 lb (76.7 kg)   LMP 10/11/2017   BMI 29.94 kg/m   Filed Weights   11/01/17 1504  Weight: 169 lb (76.7 kg)   Body mass index is 29.94 kg/m. Physical  Exam  Constitutional: She is oriented to person, place, and time. She appears well-developed and well-nourished. No distress.  Genitourinary: Rectum normal, vagina normal and uterus normal. Pelvic exam was performed with patient supine. There is no rash or lesion on the right labia. There is no rash or lesion on the left labia. Vagina exhibits no lesion. No bleeding in the vagina. Right adnexum does not display mass and does not display tenderness. Left adnexum does not display mass and does not display  tenderness. Cervix does not exhibit motion tenderness, lesion, friability or polyp.   Uterus is mobile and midaxial. Uterus is not enlarged or exhibiting a mass.  HENT:  Head: Normocephalic and atraumatic. Head is without laceration.  Right Ear: Hearing normal.  Left Ear: Hearing normal.  Nose: No epistaxis.  No foreign bodies.  Mouth/Throat: Uvula is midline, oropharynx is clear and moist and mucous membranes are normal.  Eyes: Pupils are equal, round, and reactive to light.  Neck: Normal range of motion. Neck supple. No thyromegaly present.  Cardiovascular: Normal rate and regular rhythm. Exam reveals no gallop and no friction rub.  No murmur heard. Pulmonary/Chest: Effort normal and breath sounds normal. No respiratory distress. She has no wheezes. Right breast exhibits no mass, no skin change and no tenderness. Left breast exhibits no mass, no skin change and no tenderness.  Abdominal: Soft. Bowel sounds are normal. She exhibits no distension. There is no tenderness. There is no rebound.  Musculoskeletal: Normal range of motion.  Neurological: She is alert and oriented to person, place, and time. No cranial nerve deficit.  Skin: Skin is warm and dry.  Psychiatric: She has a normal mood and affect. Judgment normal.  Vitals reviewed.  Assessment:  ANNUAL EXAM 1. Annual physical exam   2. Menorrhagia with regular cycle   3. Screening for cervical cancer    Screening Plan:            1.  Cervical Screening-  Pap smear done today  2. Breast screening- Exam annually and mammogram>40 planned   3. Colonoscopy every 10 years, Hemoccult testing - after age 24  4. Labs none needed today.  Gc/Chl screen w PAP  5. Counseling for contraception: oral contraceptives (estrogen/progesterone)  Other:  1. Annual physical exam - NORTREL 1/35, 28, tablet; Take 1 tablet by mouth daily.  Dispense: 1 Package; Refill: 12  2. Menorrhagia with regular cycle - Cont OCP for control of sx's.  Also  may help w possible endometriosis.  3. Screening for cervical cancer - IGP, CtNg, rfx Aptima HPV ASCU    F/U  Return in about 1 year (around 11/01/2018) for Annual.  Kristen MajorPaul Carrie Schoonmaker, MD, Merlinda FrederickFACOG [facility], [facility] 11/01/2017  3:40 PM

## 2017-11-01 NOTE — Patient Instructions (Signed)
PAP every year  Ethinyl Estradiol; Norethindrone tablets What is this medicine? ETHINYL ESTRADIOL; NORETHINDRONE (ETH in il es tra DYE ole; nor eth IN drone) is an oral contraceptive. The products combine two types of female hormones, an estrogen and a progestin. They are used to prevent ovulation and pregnancy. This medicine may be used for other purposes; ask your health care provider or pharmacist if you have questions. COMMON BRAND NAME(S): Rica Mast, Brevicon, BRIELLYN, Cyclafem 1/35, Cyclafem 7/7/7, DASETTA, [place], [place], [place], Necon 0.5/35, Necon 1/35, Necon 10/11, Necon 7/7/7, Norinyl 1/35, Nortrel 0.5/35, Nortrel 1/35, Nortrel 7/7/7, Ortho-Novum 1/35, Ortho-Novum 10/11, Ortho-Novum 7/7/7, Ovcon 35, Ovcon 50, PHILITH, Pirmella, Tri-Norinyl, Vyfemla, WERA, [place] What should I tell my health care provider before I take this medicine? They need to know if you have or ever had any of these conditions: -abnormal vaginal bleeding -blood vessel disease or blood clots -breast, cervical, endometrial, ovarian, liver, or uterine cancer -diabetes -gallbladder disease -heart disease or recent heart attack -high blood pressure -high cholesterol -kidney disease -liver disease -migraine headaches -stroke -systemic lupus erythematosus (SLE) -tobacco smoker -an unusual or allergic reaction to estrogens, progestins, other medicines, foods, dyes, or preservatives -pregnant or trying to get pregnant -breast-feeding How should I use this medicine? Take this medicine by mouth. To reduce nausea, this medicine may be taken with food. Follow the directions on the prescription label. Take this medicine at the same time each day and in the order directed on the package. Do not take your medicine more often than directed. A patient package insert for the product will be given with each prescription and refill. Read this sheet carefully each time. The sheet may change  frequently. Contact your pediatrician regarding the use of this medicine in children. Special care may be needed. This medicine has been used in female children who have started having menstrual periods. Overdosage: If you think you have taken too much of this medicine contact a poison control center or emergency room at once. NOTE: This medicine is only for you. Do not share this medicine with others. What if I miss a dose? If you miss a dose, refer to the patient information sheet you received with your medicine for direction. If you miss more than one pill, this medicine may not be as effective and you may need to use another form of birth control. What may interact with this medicine? Do not take this medicine with the following medication: -dasabuvir; ombitasvir; paritaprevir; ritonavir -ombitasvir; paritaprevir; ritonavir This medicine may also interact with the following medications: -acetaminophen -antibiotics or medicines for infections, especially rifampin, rifabutin, rifapentine, and griseofulvin, and possibly penicillins or tetracyclines -aprepitant -ascorbic acid (vitamin C) -atorvastatin -barbiturate medicines, such as phenobarbital -bosentan -carbamazepine -caffeine -clofibrate -cyclosporine -dantrolene -doxercalciferol -felbamate -grapefruit juice -hydrocortisone -medicines for anxiety or sleeping problems, such as diazepam or temazepam -medicines for diabetes, including pioglitazone -mineral oil -modafinil -mycophenolate -nefazodone -oxcarbazepine -phenytoin -prednisolone -ritonavir or other medicines for HIV infection or AIDS -rosuvastatin -selegiline -soy isoflavones supplements -St. John's wort -tamoxifen or raloxifene -theophylline -thyroid hormones -topiramate -warfarin This list may not describe all possible interactions. Give your health care provider a list of all the medicines, herbs, non-prescription drugs, or dietary supplements you use. Also  tell them if you smoke, drink alcohol, or use illegal drugs. Some items may interact with your medicine. What should I watch for while using this medicine? Visit your doctor or health care professional for regular checks on your progress. You will need a regular breast  and pelvic exam and Pap smear while on this medicine. Use an additional method of contraception during the first cycle that you take these tablets. If you have any reason to think you are pregnant, stop taking this medicine right away and contact your doctor or health care professional. If you are taking this medicine for hormone related problems, it may take several cycles of use to see improvement in your condition. Smoking increases the risk of getting a blood clot or having a stroke while you are taking birth control pills, especially if you are more than 24 years old. You are strongly advised not to smoke. This medicine can make your body retain fluid, making your fingers, hands, or ankles swell. Your blood pressure can go up. Contact your doctor or health care professional if you feel you are retaining fluid. This medicine can make you more sensitive to the sun. Keep out of the sun. If you cannot avoid being in the sun, wear protective clothing and use sunscreen. Do not use sun lamps or tanning beds/booths. If you wear contact lenses and notice visual changes, or if the lenses begin to feel uncomfortable, consult your eye care specialist. In some women, tenderness, swelling, or minor bleeding of the gums may occur. Notify your dentist if this happens. Brushing and flossing your teeth regularly may help limit this. See your dentist regularly and inform your dentist of the medicines you are taking. If you are going to have elective surgery, you may need to stop taking this medicine before the surgery. Consult your health care professional for advice. This medicine does not protect you against HIV infection (AIDS) or any other sexually  transmitted diseases. What side effects may I notice from receiving this medicine? Side effects that you should report to your doctor or health care professional as soon as possible: -allergic reactions like skin rash, itching or hives, swelling of the face, lips, or tongue -breast tissue changes or discharge -changes in vaginal bleeding during your period or between your periods -chest pain -coughing up blood -dizziness or fainting spells -headaches or migraines -leg, arm or groin pain -problems with balance, talking, walking -severe or sudden headaches -severe stomach pain -sudden shortness of breath -symptoms of vaginal infection like itching, irritation or unusual discharge -tenderness in the upper abdomen -vomiting -weakness or numbness in the arms or legs, especially on one side of the body -yellowing of the eyes or skin Side effects that usually do not require medical attention (report to your doctor or health care professional if they continue or are bothersome): -breakthrough bleeding and spotting that continues beyond the 3 initial cycles of pills -breast tenderness -mood changes, anxiety, depression, frustration, anger, or emotional outbursts -increased sensitivity to sun or ultraviolet light -nausea -skin rash, acne, or brown spots on the skin -slight weight gain This list may not describe all possible side effects. Call your doctor for medical advice about side effects. You may report side effects to FDA at 1-800-FDA-1088. Where should I keep my medicine? Keep out of the reach of children. Store at room temperature between 15 and 30 degrees C (59 and 86 degrees F). Throw away any unused medicine after the expiration date. NOTE: This sheet is a summary. It may not cover all possible information. If you have questions about this medicine, talk to your doctor, pharmacist, or health care provider.  2018 Elsevier/Gold Standard (2016-06-25 08:06:39)

## 2017-11-05 ENCOUNTER — Telehealth: Payer: Self-pay | Admitting: Obstetrics & Gynecology

## 2017-11-05 LAB — IGP, CTNG, RFX APTIMA HPV ASCU
Chlamydia, Nuc. Acid Amp: NEGATIVE
Gonococcus by Nucleic Acid Amp: NEGATIVE
PAP Smear Comment: 0

## 2017-11-05 NOTE — Telephone Encounter (Signed)
Called and lvm for pt to call back to be schedule °

## 2017-11-05 NOTE — Progress Notes (Signed)
Schedule COLPO w PH. She is aware.

## 2017-11-05 NOTE — Telephone Encounter (Signed)
Pt is schedule 11/15/17

## 2017-11-05 NOTE — Telephone Encounter (Signed)
-----   Message from Nadara Mustardobert P Harris, MD sent at 11/05/2017  7:25 AM EST ----- Schedule COLPO w PH. She is aware.

## 2017-11-13 ENCOUNTER — Other Ambulatory Visit: Payer: Self-pay | Admitting: Physician Assistant

## 2017-11-13 DIAGNOSIS — J0110 Acute frontal sinusitis, unspecified: Secondary | ICD-10-CM

## 2017-11-15 ENCOUNTER — Encounter: Payer: Self-pay | Admitting: Obstetrics & Gynecology

## 2017-11-15 ENCOUNTER — Ambulatory Visit (INDEPENDENT_AMBULATORY_CARE_PROVIDER_SITE_OTHER): Payer: BC Managed Care – PPO | Admitting: Obstetrics & Gynecology

## 2017-11-15 VITALS — BP 102/60 | HR 80 | Ht 63.0 in | Wt 169.0 lb

## 2017-11-15 DIAGNOSIS — R87612 Low grade squamous intraepithelial lesion on cytologic smear of cervix (LGSIL): Secondary | ICD-10-CM | POA: Insufficient documentation

## 2017-11-15 DIAGNOSIS — R350 Frequency of micturition: Secondary | ICD-10-CM | POA: Diagnosis not present

## 2017-11-15 HISTORY — DX: Low grade squamous intraepithelial lesion on cytologic smear of cervix (LGSIL): R87.612

## 2017-11-15 LAB — POCT URINALYSIS DIPSTICK
Bilirubin, UA: NEGATIVE
Glucose, UA: NEGATIVE
Ketones, UA: NEGATIVE
NITRITE UA: NEGATIVE
PH UA: 6.5 (ref 5.0–8.0)
PROTEIN UA: NEGATIVE
RBC UA: POSITIVE
Spec Grav, UA: 1.01 (ref 1.010–1.025)
Urobilinogen, UA: 0.2 E.U./dL

## 2017-11-15 MED ORDER — SULFAMETHOXAZOLE-TRIMETHOPRIM 800-160 MG PO TABS: 1.0000 | ORAL_TABLET | Freq: Two times a day (BID) | ORAL | 0 refills | Status: AC

## 2017-11-15 NOTE — Patient Instructions (Signed)

## 2017-11-15 NOTE — Progress Notes (Signed)
HPI:  Kristen OvensHannah Galloway is a 22 y.o.  G0P0000  who presents today for evaluation and management of abnormal cervical cytology.    Dysplasia History:  LGSIL 2019 2018 Normal PAP  ROS:  Pertinent items are noted in HPI.  OB History  Gravida Para Term Preterm AB Living  0 0 0 0 0 0  SAB TAB Ectopic Multiple Live Births  0 0 0 0 0        Past Medical History:  Diagnosis Date  . Anemia   . Bacterial vaginosis   . Chronic pelvic pain in female 06/09/2015  . Dyspareunia in female   . Headache   . Ovarian cyst     Past Surgical History:  Procedure Laterality Date  . LAPAROSCOPY N/A 06/09/2015   Procedure: LAPAROSCOPY DIAGNOSTIC;  Surgeon: Conard NovakStephen D Jackson, MD;  Location: [facility] ORS;  Service: Gynecology;  Laterality: N/A;  . WISDOM TOOTH EXTRACTION      SOCIAL HISTORY: Social History   Substance and Sexual Activity  Alcohol Use Yes   Comment: occasional   Social History   Substance and Sexual Activity  Drug Use No     Family History  Problem Relation Age of Onset  . Endometriosis Mother   . Colon cancer Paternal Grandmother   . Endometriosis Maternal Grandmother   . Melanoma Maternal Grandmother   . Diabetes Maternal Grandfather   . Diabetes Paternal Grandfather   . Heart disease Paternal Grandfather     ALLERGIES:  Cefzil [cefprozil] and Prednisone  Current Outpatient Medications on File Prior to Visit  Medication Sig Dispense Refill  . ascorbic acid (VITAMIN C) 1000 MG tablet Take 1,000 mg by mouth daily.    . calcium-vitamin D (OSCAL WITH D) 500-200 MG-UNIT tablet Take 1 tablet by mouth.    . doxycycline (VIBRA-TABS) 100 MG tablet Take 1 tablet (100 mg total) by mouth 2 (two) times daily. 20 tablet 0  . ferrous sulfate 325 (65 FE) MG EC tablet Take 325 mg by mouth daily.    . Lactobacillus (PROBIOTIC CHILDRENS) CHEW Chew by mouth.    . Multiple Vitamin (MULTI-VITAMINS) TABS Take by mouth.    . NORTREL 1/35, 28, tablet Take 1 tablet by mouth daily. 1  Package 12  . vitamin B-12 (CYANOCOBALAMIN) 1000 MCG tablet Take by mouth.     No current facility-administered medications on file prior to visit.     Physical Exam: -Vitals:  BP 102/60   Pulse 80   Ht 5\' 3"  (1.6 m)   Wt 169 lb (76.7 kg)   BMI 29.94 kg/m  GEN: WD, WN, NAD.  A+ O x 3, good mood and affect. ABD:  NT, ND.  Soft, no masses.  No hernias noted.   Pelvic:   Vulva: Normal appearance.  No lesions.  Vagina: No lesions or abnormalities noted.  Support: Normal pelvic support.  Urethra No masses tenderness or scarring.  Meatus Normal size without lesions or prolapse.  Cervix: See below.  Anus: Normal exam.  No lesions.  Perineum: Normal exam.  No lesions.        Bimanual   Uterus: Normal size.  Non-tender.  Mobile.  AV.  Adnexae: No masses.  Non-tender to palpation.  Cul-de-sac: Negative for abnormality.  Physical Exam  Genitourinary:      PROCEDURE: 1.  Urine Pregnancy Test:  negative 2.  Colposcopy performed with 4% acetic acid after verbal consent obtained                                         -  Aceto-white Lesions Location(s): 12 o'clock.              -Biopsy performed at 6,12 o'clock               -ECC indicated and performed: Yes.       -Biopsy sites made hemostatic with pressure, AgNO3, and/or Monsel's solution   -Satisfactory colposcopy: Yes.      -Evidence of Invasive cervical CA :  NO  ASSESSMENT:  Kristen Galloway is a 22 y.o. G0P0000 here for  1. Urine frequency   2. Low grade squamous intraepith lesion on cytologic smear cervix (lgsil)   .  PLAN: 1.  I discussed the grading system of pap smears and HPV high risk viral types.  We will discuss and base management after colpo results return. 2. Follow up PAP 6 months, vs intervention if high grade dysplasia identified 3. Treatment of persistantly abnormal PAP smears and cervical dysplasia, even mild, is discussed w pt today in detail, as well as the pros and cons of Cryo and LEEP procedures.  Will consider and discuss after results.  Also, Results for orders placed or performed in visit on 11/15/17  POCT urinalysis dipstick  Result Value Ref Range   Color, UA yellow    Clarity, UA clear    Glucose, UA neg    Bilirubin, UA neg    Ketones, UA neg    Spec Grav, UA 1.010 1.010 - 1.025   Blood, UA positive    pH, UA 6.5 5.0 - 8.0   Protein, UA neg    Urobilinogen, UA 0.2 0.2 or 1.0 E.U./dL   Nitrite, UA neg    Leukocytes, UA Moderate (2+) (A) Negative   Appearance     Odor     Bactrim for UTI     [name], MD, [facility], [facility] 11/15/2017  3:34 PM

## 2017-11-18 NOTE — Addendum Note (Signed)
Addended by: Cornelius MorasPATTERSON, Kiah Keay D on: 11/18/2017 08:47 AM   Modules accepted: Orders

## 2017-11-20 ENCOUNTER — Telehealth: Payer: Self-pay | Admitting: Obstetrics & Gynecology

## 2017-11-20 LAB — PATHOLOGY

## 2017-11-20 LAB — URINE CULTURE

## 2017-11-20 NOTE — Telephone Encounter (Signed)
Left voicemail for patient to call back to be schedule. 40 min appt in regular exam room

## 2017-11-20 NOTE — Telephone Encounter (Signed)
-----   Message from Nadara Mustardobert P Harris, MD sent at 11/20/2017  1:28 PM EST ----- Please schedule appointment for CRYO THERAPY to cervix w PH.  Make sure not on period.

## 2017-11-20 NOTE — Progress Notes (Signed)
Please schedule appointment for CRYO THERAPY to cervix w PH.  Make sure not on period.

## 2017-11-22 ENCOUNTER — Telehealth: Payer: Self-pay

## 2017-11-22 NOTE — Telephone Encounter (Signed)
Left voice message to let her know flying is ok

## 2017-11-22 NOTE — Telephone Encounter (Signed)
Pt is scheduled to have cryo therapy with [facility] on 2/12. Pt called triage this morning wanting to make sure it would be okay for her to fly on 2/15 after the procedure. Please advise. Cb# [phone]

## 2017-11-22 NOTE — Telephone Encounter (Signed)
This should be fine right?

## 2017-11-22 NOTE — Telephone Encounter (Signed)
Yes, it will be OK

## 2017-12-02 ENCOUNTER — Telehealth: Payer: Self-pay

## 2017-12-02 NOTE — Telephone Encounter (Signed)
Pt had procedure done 11/15/17, is now on period which is fine, has low body temperature - 96.7, can get it back up c heating pad and blankets but it goes back down when she isn't bundled up.  She thinks her iron is low from bleeding from the procedure and having her period close together.  Should she take more of her iron supplement? [phone]

## 2017-12-02 NOTE — Telephone Encounter (Signed)
OK to take iron.  Can check level on her if desired.

## 2017-12-02 NOTE — Telephone Encounter (Signed)
Pt aware.

## 2017-12-10 ENCOUNTER — Ambulatory Visit (INDEPENDENT_AMBULATORY_CARE_PROVIDER_SITE_OTHER): Payer: BC Managed Care – PPO | Admitting: Obstetrics & Gynecology

## 2017-12-10 ENCOUNTER — Encounter: Payer: Self-pay | Admitting: Obstetrics & Gynecology

## 2017-12-10 VITALS — BP 120/80 | HR 75 | Ht 63.0 in | Wt 168.0 lb

## 2017-12-10 DIAGNOSIS — N871 Moderate cervical dysplasia: Secondary | ICD-10-CM | POA: Diagnosis not present

## 2017-12-10 DIAGNOSIS — N920 Excessive and frequent menstruation with regular cycle: Secondary | ICD-10-CM

## 2017-12-10 NOTE — Progress Notes (Signed)
   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  The indications for cryotherapy were reviewed with the patient in detail. She was counseled about that efficacy of this procedure, and possible need for excisional procedure in the future if her cervical dysplasia persists.  The risks of the procedure where explained in detail and patient was told to expect a copious amount of discharge in the next few weeks. All her questions were answered, and written informed consent was obtained.  The patient was placed in the dorsal lithotomy position and a vaginal speculum was placed. Her cervix was visualized and patient was noted to have had normal size transformation zone. The appropriate cryotherapy probe was picked and affixed to cryotherapy apparatus. Then nitrogen gas was then activated, the probe was coated with lubricating jelly and applied to the transformation zone of the cervix. This was kept in place for 2 minutes. The cryotherapy was then stopped and all instruments were removed from the patient's pelvis; a thawing period of 2 minutes was observed.  A second cycle of cryotherapy was then administered to the cervix for 2 minutes.  The patient tolerated the procedure well without any complications. Routine post procedure instructions were given to the patient.  Will repeat pap smear in 6 months and manage accordingly.  Also, CBC due to past 2 periods heavy as well as bleeding from biopsies; has felt cold and fatigue this month.  [name] Diar Berkel, MD, Merlinda FrederickFACOG [facility], [facility] 12/10/2017  2:30 PM

## 2017-12-10 NOTE — Patient Instructions (Signed)
Cervical Dysplasia Cervical dysplasia is a condition in which a woman's cervix cells have abnormal changes. The cervix is the opening of the uterus (womb). It is located between the vagina and the uterus. Cervical dysplasia may be an early sign of cervical cancer. If left untreated, this condition may become more severe and may progress to cervical cancer. Early detection, treatment, and follow-up care are very important. What are the causes? Cervical dysplasia can be caused by a human papillomavirus (HPV) infection. HPV is the most common sexually transmitted infection (STI). HPV is spread from person to person through sexual contact. This includes oral, vaginal, or anal sex. What increases the risk? The following factors may make you more likely to develop this condition:  Having had a sexually transmitted infection (STI), such as herpes, chlamydia or HPV.  Becoming sexually active before age 18.  Having had more than one sexual partner.  Having a sexual partner who has multiple sexual partners.  Not using protection, such as a condom, during sex, especially with new sexual partners.  Having a history of cancer of the vagina or vulva.  Having a weakened body defense (immune) system.  Being the daughter of a woman who took diethylstilbestrol (DES) during pregnancy.  Having a family history of cervical cancer.  Smoking.  Using oral contraceptives, also called birth control pills.  Having had three or more full-term pregnancies.  What are the signs or symptoms? There are usually no symptoms of this condition. If you do have symptoms, they may include:  Abnormal vaginal discharge.  Bleeding between periods or after sex.  Bleeding during menopause.  Pain during sex (dyspareunia).  How is this diagnosed? A test called a Pap test may be done. During this test, cells are taken from the cervix and then examined under a microscope. A test in which tissue is removed from the cervix  (biopsy) may also be done if the Pap test is abnormal or if the cervix looks abnormal. How is this treated? Treatment varies based on the severity of the condition. Treatment may include:  Cryotherapy. During cryotherapy, the abnormal cells are frozen with a steel-tip instrument.  Loop electrosurgical excision procedure (LEEP). This procedure removes abnormal tissue from the cervix.  Surgery to remove abnormal tissue. This is usually done in more advanced cases. Surgical options include: ? A cone biopsy. This is a procedure in which the cervical canal and a portion of the center of the cervix are removed. ? Hysterectomy. This is a surgery in which the uterus and cervix are removed.  Follow these instructions at home:  Take over-the-counter and prescription medicines only as told by your health care provider.  Do not use tampons, have sex, or douche until your health care provider says it is okay.  Keep follow-up visits as told by your health care provider. This is important. Women who have been treated for cervical dysplasia should have regular pelvic exams and Pap tests as told by their health care provider. How is this prevented?  Practice safe sex to help prevent sexually transmitted infections (STI) that may cause this condition.  Have regular Pap tests. Talk with your health care provider about how often you need these tests. Pap tests will help identify cell changes that can lead to cancer. Contact a health care provider if:  You develop genital warts. Get help right away if:  You have a fever.  You have abnormal vaginal discharge.  Your menstrual period is heavier than normal.  You develop bright   red bleeding. This may include blood clots.  You have pain or cramps that get worse, and medicine does not help to relieve your pain.  You feel light-headed and you are unusually weak.  You have fainting spells.  You have pain in the abdomen. Summary  Cervical dysplasia  is a condition in which a woman's cervix cells have abnormal changes.  If left untreated, this condition may become more severe and may progress to cervical cancer.  Early detection, treatment, and follow-up care are very important in managing this condition.  Have regular pelvic exams and Pap tests. Talk with your health care provider about how often you need these tests. Pap tests will help identify cell changes that can lead to cancer. This information is not intended to replace advice given to you by your health care provider. Make sure you discuss any questions you have with your health care provider. Document Released: 10/15/2005 Document Revised: 10/18/2016 Document Reviewed: 10/18/2016 Elsevier Interactive Patient Education  2017 Elsevier Inc.  

## 2017-12-11 LAB — CBC
HEMATOCRIT: 40.2 % (ref 34.0–46.6)
Hemoglobin: 13.5 g/dL (ref 11.1–15.9)
MCH: 25.9 pg — AB (ref 26.6–33.0)
MCHC: 33.6 g/dL (ref 31.5–35.7)
MCV: 77 fL — AB (ref 79–97)
Platelets: 358 10*3/uL (ref 150–379)
RBC: 5.21 x10E6/uL (ref 3.77–5.28)
RDW: 13.8 % (ref 12.3–15.4)
WBC: 9.2 10*3/uL (ref 3.4–10.8)

## 2018-05-07 ENCOUNTER — Other Ambulatory Visit: Payer: Self-pay | Admitting: Obstetrics & Gynecology

## 2018-06-11 ENCOUNTER — Ambulatory Visit: Payer: BC Managed Care – PPO | Admitting: Obstetrics & Gynecology

## 2018-06-13 ENCOUNTER — Ambulatory Visit: Payer: BC Managed Care – PPO | Admitting: Obstetrics & Gynecology

## 2018-06-20 ENCOUNTER — Other Ambulatory Visit (HOSPITAL_COMMUNITY)
Admission: RE | Admit: 2018-06-20 | Discharge: 2018-06-20 | Disposition: A | Discharge status: Home / Self Care | Payer: BC Managed Care – PPO | Source: Ambulatory Visit | Attending: Obstetrics & Gynecology | Admitting: Obstetrics & Gynecology

## 2018-06-20 ENCOUNTER — Encounter: Payer: Self-pay | Admitting: Obstetrics & Gynecology

## 2018-06-20 ENCOUNTER — Ambulatory Visit (INDEPENDENT_AMBULATORY_CARE_PROVIDER_SITE_OTHER): Payer: BC Managed Care – PPO | Admitting: Obstetrics & Gynecology

## 2018-06-20 DIAGNOSIS — N871 Moderate cervical dysplasia: Secondary | ICD-10-CM | POA: Diagnosis not present

## 2018-06-20 DIAGNOSIS — Z87410 Personal history of cervical dysplasia: Secondary | ICD-10-CM | POA: Insufficient documentation

## 2018-06-20 DIAGNOSIS — Z09 Encounter for follow-up examination after completed treatment for conditions other than malignant neoplasm: Secondary | ICD-10-CM | POA: Diagnosis not present

## 2018-06-20 HISTORY — DX: Moderate cervical dysplasia: N87.1

## 2018-06-20 NOTE — Progress Notes (Signed)
HPI:  Patient is a 22 y.o. G0P0000 presenting for follow up evaluation of abnormal PAP smear in the past.  Her last PAP was 7 months ago and was abnormal: LGSIL. She has had a prior colposcopy. 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  This was followed by a cervical cryotherapy procedure.  PMHx: She  has a past medical history of Anemia, Bacterial vaginosis, Chronic pelvic pain in female (06/09/2015), Dyspareunia in female, Headache, and Ovarian cyst. Also,  has a past surgical history that includes laparoscopy (N/A, 06/09/2015) and Wisdom tooth extraction., family history includes Colon cancer in her paternal grandmother; Diabetes in her maternal grandfather and paternal grandfather; Endometriosis in her maternal grandmother and mother; Heart disease in her paternal grandfather; Melanoma in her maternal grandmother.,  reports that she has never smoked. She has never used smokeless tobacco. She reports that she drinks alcohol. She reports that she does not use drugs.  She has a current medication list which includes the following prescription(s): ascorbic acid, calcium-vitamin d, doxycycline, ferrous sulfate, probiotic childrens, multi-vitamins, nortrel 1/35 (28), and vitamin b-12. Also, is allergic to cefzil [cefprozil] and prednisone.  Review of Systems  Constitutional: Negative for chills, fever and malaise/fatigue.  HENT: Negative for congestion, sinus pain and sore throat.   Eyes: Negative for blurred vision and pain.  Respiratory: Negative for cough and wheezing.   Cardiovascular: Negative for chest pain and leg swelling.  Gastrointestinal: Negative for abdominal pain, constipation, diarrhea, heartburn, nausea and vomiting.  Genitourinary: Negative for dysuria, frequency, hematuria and urgency.  Musculoskeletal: Negative for back pain, joint pain, myalgias and neck pain.  Skin: Negative for itching and rash.  Neurological: Negative for dizziness, tremors and weakness.  Endo/Heme/Allergies: Does  not bruise/bleed easily.  Psychiatric/Behavioral: Negative for depression. The patient is not nervous/anxious and does not have insomnia.     Objective: BP 100/60   Ht 5\' 3"  (1.6 m)   Wt 170 lb (77.1 kg)   LMP 06/15/2018   BMI 30.11 kg/m  Filed Weights   06/20/18 1401  Weight: 170 lb (77.1 kg)   Body mass index is 30.11 kg/m.  Physical examination Physical Exam  Constitutional: She is oriented to person, place, and time. She appears well-developed and well-nourished. No distress.  Genitourinary: Vagina normal and uterus normal. Pelvic exam was performed with patient supine. There is no rash, tenderness or lesion on the right labia. There is no rash, tenderness or lesion on the left labia. No erythema or bleeding in the vagina. Right adnexum does not display mass and does not display tenderness. Left adnexum does not display mass and does not display tenderness. Cervix does not exhibit motion tenderness, discharge, polyp or nabothian cyst.   Uterus is mobile and midaxial. Uterus is not enlarged or exhibiting a mass.  HENT:  Head: Normocephalic and atraumatic.  Nose: Nose normal.  Mouth/Throat: Oropharynx is clear and moist.  Abdominal: Soft. She exhibits no distension. There is no tenderness.  Musculoskeletal: Normal range of motion.  Neurological: She is alert and oriented to person, place, and time. No cranial nerve deficit.  Skin: Skin is warm and dry.  Psychiatric: She has a normal mood and affect.    ASSESSMENT:  History of Cervical Dysplasia  Plan:  1.  I discussed the grading system of pap smears and HPV high risk viral types.   2. Follow up PAP 6 months, vs intervention if high grade dysplasia identified.  [name] Vir Whetstine, MD, Merlinda FrederickFACOG [facility], [facility] 06/20/2018  2:22  PM

## 2018-06-23 LAB — CYTOLOGY - PAP
CHLAMYDIA, DNA PROBE: NEGATIVE
Diagnosis: NEGATIVE
NEISSERIA GONORRHEA: NEGATIVE

## 2018-08-28 ENCOUNTER — Other Ambulatory Visit: Payer: Self-pay | Admitting: Obstetrics & Gynecology

## 2018-08-28 DIAGNOSIS — Z0000 Encounter for general adult medical examination without abnormal findings: Secondary | ICD-10-CM

## 2018-09-22 ENCOUNTER — Ambulatory Visit: Payer: BC Managed Care – PPO | Admitting: Family Medicine

## 2018-09-22 ENCOUNTER — Encounter: Payer: Self-pay | Admitting: Family Medicine

## 2018-09-22 VITALS — BP 102/60 | HR 65 | Temp 97.9°F | Wt 171.8 lb

## 2018-09-22 DIAGNOSIS — Z8349 Family history of other endocrine, nutritional and metabolic diseases: Secondary | ICD-10-CM

## 2018-09-22 DIAGNOSIS — R6889 Other general symptoms and signs: Secondary | ICD-10-CM

## 2018-09-22 NOTE — Progress Notes (Signed)
Patient: Kristen Galloway Female    DOB: [birth year]   22 y.o.   MRN: [id number] Visit Date: 09/22/2018  Today's Provider: Dortha Kern, PA   Chief Complaint  Patient presents with  . Low body temperature   Subjective:    HPI Patient presents today C/O low body temperature. Patient states her temperature was 97.3 this morning. Patient denies any other symptoms. She states she had a migraine yesterday and has been feeling fatigued lately.     Past Medical History:  Diagnosis Date  . Anemia   . Bacterial vaginosis   . Chronic pelvic pain in female 06/09/2015  . Dyspareunia in female   . Headache   . Ovarian cyst    Past Surgical History:  Procedure Laterality Date  . LAPAROSCOPY N/A 06/09/2015   Procedure: LAPAROSCOPY DIAGNOSTIC;  Surgeon: Conard Novak, MD;  Location: [facility] ORS;  Service: Gynecology;  Laterality: N/A;  . WISDOM TOOTH EXTRACTION     Family History  Problem Relation Age of Onset  . Endometriosis Mother   . Colon cancer Paternal Grandmother   . Endometriosis Maternal Grandmother   . Melanoma Maternal Grandmother   . Diabetes Maternal Grandfather   . Diabetes Paternal Grandfather   . Heart disease Paternal Grandfather    Allergies  Allergen Reactions  . Cefzil [Cefprozil] Anaphylaxis  . Prednisone Nausea Only    Current Outpatient Medications:  .  calcium-vitamin D (OSCAL WITH D) 500-200 MG-UNIT tablet, Take 1 tablet by mouth., Disp: , Rfl:  .  NORTREL 1/35, 28, tablet, TAKE 1 TABLET BY MOUTH EVERY DAY, Disp: 84 tablet, Rfl: 4 .  TURMERIC PO, Take by mouth., Disp: , Rfl:  .  ascorbic acid (VITAMIN C) 1000 MG tablet, Take 1,000 mg by mouth daily., Disp: , Rfl:  .  ferrous sulfate 325 (65 FE) MG EC tablet, Take 325 mg by mouth daily., Disp: , Rfl:  .  Lactobacillus (PROBIOTIC CHILDRENS) CHEW, Chew by mouth., Disp: , Rfl:  .  Multiple Vitamin (MULTI-VITAMINS) TABS, Take by mouth., Disp: , Rfl:  .  vitamin B-12 (CYANOCOBALAMIN) 1000 MCG  tablet, Take by mouth., Disp: , Rfl:   Review of Systems  Constitutional: Negative.   Respiratory: Negative.   Cardiovascular: Negative.   Musculoskeletal: Negative.    Social History   Tobacco Use  . Smoking status: Never Smoker  . Smokeless tobacco: Never Used  Substance Use Topics  . Alcohol use: Yes    Comment: occasional   Objective:   BP 102/60 (BP Location: Right Arm, Patient Position: Sitting, Cuff Size: Normal)   Pulse 65   Temp 97.9 F (36.6 C) (Oral)   Wt 171 lb 12.8 oz (77.9 kg)   SpO2 99%   BMI 30.43 kg/m  Vitals:   09/22/18 0926  BP: 102/60  Pulse: 65  Temp: 97.9 F (36.6 C)  TempSrc: Oral  SpO2: 99%  Weight: 171 lb 12.8 oz (77.9 kg)   Physical Exam  Constitutional: She is oriented to person, place, and time. She appears well-developed and well-nourished. No distress.  HENT:  Head: Normocephalic and atraumatic.  Right Ear: Hearing and external ear normal.  Left Ear: Hearing and external ear normal.  Nose: Nose normal.  Mouth/Throat: Oropharynx is clear and moist.  Eyes: Conjunctivae and lids are normal. Right eye exhibits no discharge. Left eye exhibits no discharge. No scleral icterus.  Neck: Neck supple. No thyromegaly present.  Cardiovascular: Normal rate and regular rhythm.  Pulmonary/Chest: Effort normal and  breath sounds normal. No respiratory distress.  Abdominal: Soft. Bowel sounds are normal.  Musculoskeletal: Normal range of motion.  Lymphadenopathy:    She has no cervical adenopathy.  Neurological: She is alert and oriented to person, place, and time.  Skin: Skin is intact. No lesion and no rash noted.  Psychiatric: She has a normal mood and affect. Her speech is normal and behavior is normal. Thought content normal.      Assessment & Plan:     1. Cold intolerance States she has always been a little cold intolerant. Over the past weekend feeling more cold and temperature down to 97.3 - 97.7. No myxedema, exophthalmos, palpitations  or significant dry skin. LMP 2 weeks ago and happening each month according to BCP. May be having some drop due to hormone changes or early sign of a viral illness. Family history of thyroid disease in several females. Will check routine labs with thyroid function tests. - CBC with Differential/Platelet - Comprehensive metabolic panel - TSH - T4  2. Family history of thyroid disease Mother, maternal grandmother and maternal aunt with thyroid disease. Concerned about possible hypothyroidism. Will check TSH and T4 levels.  - TSH - T4       Dortha Kernennis Jalynn Waddell, PA  [facility]

## 2018-09-23 LAB — CBC WITH DIFFERENTIAL/PLATELET
BASOS ABS: 0.1 10*3/uL (ref 0.0–0.2)
Basos: 1 %
EOS (ABSOLUTE): 0.1 10*3/uL (ref 0.0–0.4)
EOS: 1 %
HEMATOCRIT: 39.3 % (ref 34.0–46.6)
HEMOGLOBIN: 13.2 g/dL (ref 11.1–15.9)
Immature Grans (Abs): 0 10*3/uL (ref 0.0–0.1)
Immature Granulocytes: 0 %
LYMPHS ABS: 1.8 10*3/uL (ref 0.7–3.1)
Lymphs: 28 %
MCH: 26.4 pg — ABNORMAL LOW (ref 26.6–33.0)
MCHC: 33.6 g/dL (ref 31.5–35.7)
MCV: 79 fL (ref 79–97)
MONOCYTES: 9 %
MONOS ABS: 0.6 10*3/uL (ref 0.1–0.9)
Neutrophils Absolute: 4 10*3/uL (ref 1.4–7.0)
Neutrophils: 61 %
Platelets: 354 10*3/uL (ref 150–450)
RBC: 5 x10E6/uL (ref 3.77–5.28)
RDW: 13.3 % (ref 12.3–15.4)
WBC: 6.6 10*3/uL (ref 3.4–10.8)

## 2018-09-23 LAB — TSH: TSH: 1.31 u[IU]/mL (ref 0.450–4.500)

## 2018-09-23 LAB — COMPREHENSIVE METABOLIC PANEL
A/G RATIO: 2 (ref 1.2–2.2)
ALT: 9 IU/L (ref 0–32)
AST: 14 IU/L (ref 0–40)
Albumin: 4.4 g/dL (ref 3.5–5.5)
Alkaline Phosphatase: 60 IU/L (ref 39–117)
BUN/Creatinine Ratio: 10 (ref 9–23)
BUN: 7 mg/dL (ref 6–20)
Bilirubin Total: 0.3 mg/dL (ref 0.0–1.2)
CALCIUM: 9.3 mg/dL (ref 8.7–10.2)
CO2: 21 mmol/L (ref 20–29)
Chloride: 103 mmol/L (ref 96–106)
Creatinine, Ser: 0.7 mg/dL (ref 0.57–1.00)
GFR, EST AFRICAN AMERICAN: 140 mL/min/{1.73_m2} (ref >59)
GFR, EST NON AFRICAN AMERICAN: 122 mL/min/{1.73_m2} (ref >59)
Globulin, Total: 2.2 g/dL (ref 1.5–4.5)
Glucose: 86 mg/dL (ref 65–99)
POTASSIUM: 4.3 mmol/L (ref 3.5–5.2)
Sodium: 140 mmol/L (ref 134–144)
Total Protein: 6.6 g/dL (ref 6.0–8.5)

## 2018-09-23 LAB — T4: T4, Total: 8.5 ug/dL (ref 4.5–12.0)

## 2018-09-24 ENCOUNTER — Telehealth: Payer: Self-pay

## 2018-09-24 NOTE — Telephone Encounter (Signed)
-----   Message from Dennis E Chrismon, PA sent at 09/23/2018  3:05 PM EST ----- No signs of infection, anemia, electrolyte imbalance, liver or kidney dysfunction. No blood sugar problems and thyroid function is normal. Watch for any further symptoms or signs of a viral illness. 

## 2018-09-24 NOTE — Telephone Encounter (Signed)
Left vm for pt to call back on lab results.  dbs

## 2018-09-24 NOTE — Telephone Encounter (Signed)
-----   Message from Dennis Tamsen RoersE Chrismon, [place]PA sent at 09/23/2018  3:05 PM EST ----- No signs of infection, anemia, electrolyte imbalance, liver or kidney dysfunction. No blood sugar problems and thyroid function is normal. Watch for any further symptoms or signs of a viral illness.

## 2018-09-24 NOTE — Telephone Encounter (Signed)
Pt advised of lab results.  dbs 

## 2018-12-02 ENCOUNTER — Telehealth: Payer: Self-pay

## 2018-12-02 NOTE — Telephone Encounter (Signed)
Pt has sxs of yeast inf and her period has started.  How to proceed; what are her options?  [phone]  Pt has been on antibx; over weekend started with itchiness, uncomfortable, funky d/c c odor.  Adv to use Monistat 3d or 7d  If doesn't help to call to be seen and not to use night before appt.

## 2018-12-25 ENCOUNTER — Encounter: Payer: Self-pay | Admitting: Obstetrics & Gynecology

## 2018-12-25 ENCOUNTER — Other Ambulatory Visit (HOSPITAL_COMMUNITY)
Admission: RE | Admit: 2018-12-25 | Discharge: 2018-12-25 | Disposition: A | Discharge status: Home / Self Care | Payer: BC Managed Care – PPO | Source: Ambulatory Visit | Attending: Obstetrics & Gynecology | Admitting: Obstetrics & Gynecology

## 2018-12-25 ENCOUNTER — Ambulatory Visit (INDEPENDENT_AMBULATORY_CARE_PROVIDER_SITE_OTHER): Payer: BC Managed Care – PPO | Admitting: Obstetrics & Gynecology

## 2018-12-25 VITALS — BP 100/60 | Ht 63.0 in | Wt 170.0 lb

## 2018-12-25 DIAGNOSIS — Z0000 Encounter for general adult medical examination without abnormal findings: Secondary | ICD-10-CM

## 2018-12-25 DIAGNOSIS — Z01419 Encounter for gynecological examination (general) (routine) without abnormal findings: Secondary | ICD-10-CM | POA: Diagnosis not present

## 2018-12-25 DIAGNOSIS — N871 Moderate cervical dysplasia: Secondary | ICD-10-CM | POA: Diagnosis not present

## 2018-12-25 MED ORDER — NORTREL 1/35 (28) 1-35 MG-MCG PO TABS: 1.0000 | ORAL_TABLET | Freq: Every day | ORAL | 4 refills | Status: DC

## 2018-12-25 NOTE — Patient Instructions (Signed)
Ethinyl Estradiol; Norethindrone tablets What is this medicine? ETHINYL ESTRADIOL; NORETHINDRONE (ETH in il es tra DYE ole; nor eth IN drone) is an oral contraceptive. The products combine two types of female hormones, an estrogen and a progestin. They are used to prevent ovulation and pregnancy. This medicine may be used for other purposes; ask your health care provider or pharmacist if you have questions. COMMON BRAND NAME(S): ALYACEN, Aranelle, Balziva, Brevicon, BRIELLYN, Cyclafem 1/35, Cyclafem 7/7/7, DASETTA, Gildagia, Leena, Modicon, Necon 0.5/35, Necon 1/35, Necon 10/11, Necon 7/7/7, Norinyl 1/35, Nortrel 0.5/35, Nortrel 1/35, Nortrel 7/7/7, Ortho-Novum 1/35, Ortho-Novum 10/11, Ortho-Novum 7/7/7, Ovcon 35, Ovcon 50, PHILITH, Pirmella, Tri-Norinyl, Vyfemla, WERA, Zenchent What should I tell my health care provider before I take this medicine? They need to know if you have or ever had any of these conditions: -abnormal vaginal bleeding -blood vessel disease or blood clots -breast, cervical, endometrial, ovarian, liver, or uterine cancer -diabetes -gallbladder disease -heart disease or recent heart attack -high blood pressure -high cholesterol -kidney disease -liver disease -migraine headaches -stroke -systemic lupus erythematosus (SLE) -tobacco smoker -an unusual or allergic reaction to estrogens, progestins, other medicines, foods, dyes, or preservatives -pregnant or trying to get pregnant -breast-feeding How should I use this medicine? Take this medicine by mouth. To reduce nausea, this medicine may be taken with food. Follow the directions on the prescription label. Take this medicine at the same time each day and in the order directed on the package. Do not take your medicine more often than directed. A patient package insert for the product will be given with each prescription and refill. Read this sheet carefully each time. The sheet may change frequently. Contact your  pediatrician regarding the use of this medicine in children. Special care may be needed. This medicine has been used in female children who have started having menstrual periods. Overdosage: If you think you have taken too much of this medicine contact a poison control center or emergency room at once. NOTE: This medicine is only for you. Do not share this medicine with others. What if I miss a dose? If you miss a dose, refer to the patient information sheet you received with your medicine for direction. If you miss more than one pill, this medicine may not be as effective and you may need to use another form of birth control. What may interact with this medicine? Do not take this medicine with the following medication: -dasabuvir; ombitasvir; paritaprevir; ritonavir -ombitasvir; paritaprevir; ritonavir This medicine may also interact with the following medications: -acetaminophen -antibiotics or medicines for infections, especially rifampin, rifabutin, rifapentine, and griseofulvin, and possibly penicillins or tetracyclines -aprepitant -ascorbic acid (vitamin C) -atorvastatin -barbiturate medicines, such as phenobarbital -bosentan -carbamazepine -caffeine -clofibrate -cyclosporine -dantrolene -doxercalciferol -felbamate -grapefruit juice -hydrocortisone -medicines for anxiety or sleeping problems, such as diazepam or temazepam -medicines for diabetes, including pioglitazone -mineral oil -modafinil -mycophenolate -nefazodone -oxcarbazepine -phenytoin -prednisolone -ritonavir or other medicines for HIV infection or AIDS -rosuvastatin -selegiline -soy isoflavones supplements -St. John's wort -tamoxifen or raloxifene -theophylline -thyroid hormones -topiramate -warfarin This list may not describe all possible interactions. Give your health care provider a list of all the medicines, herbs, non-prescription drugs, or dietary supplements you use. Also tell them if you smoke,  drink alcohol, or use illegal drugs. Some items may interact with your medicine. What should I watch for while using this medicine? Visit your doctor or health care professional for regular checks on your progress. You will need a regular breast and pelvic exam and   Pap smear while on this medicine. Use an additional method of contraception during the first cycle that you take these tablets. If you have any reason to think you are pregnant, stop taking this medicine right away and contact your doctor or health care professional. If you are taking this medicine for hormone related problems, it may take several cycles of use to see improvement in your condition. Smoking increases the risk of getting a blood clot or having a stroke while you are taking birth control pills, especially if you are more than 25 years old. You are strongly advised not to smoke. This medicine can make your body retain fluid, making your fingers, hands, or ankles swell. Your blood pressure can go up. Contact your doctor or health care professional if you feel you are retaining fluid. This medicine can make you more sensitive to the sun. Keep out of the sun. If you cannot avoid being in the sun, wear protective clothing and use sunscreen. Do not use sun lamps or tanning beds/booths. If you wear contact lenses and notice visual changes, or if the lenses begin to feel uncomfortable, consult your eye care specialist. In some women, tenderness, swelling, or minor bleeding of the gums may occur. Notify your dentist if this happens. Brushing and flossing your teeth regularly may help limit this. See your dentist regularly and inform your dentist of the medicines you are taking. If you are going to have elective surgery, you may need to stop taking this medicine before the surgery. Consult your health care professional for advice. This medicine does not protect you against HIV infection (AIDS) or any other sexually transmitted  diseases. What side effects may I notice from receiving this medicine? Side effects that you should report to your doctor or health care professional as soon as possible: -allergic reactions like skin rash, itching or hives, swelling of the face, lips, or tongue -breast tissue changes or discharge -changes in vaginal bleeding during your period or between your periods -chest pain -coughing up blood -dizziness or fainting spells -headaches or migraines -leg, arm or groin pain -problems with balance, talking, walking -severe or sudden headaches -severe stomach pain -sudden shortness of breath -symptoms of vaginal infection like itching, irritation or unusual discharge -tenderness in the upper abdomen -vomiting -weakness or numbness in the arms or legs, especially on one side of the body -yellowing of the eyes or skin Side effects that usually do not require medical attention (report to your doctor or health care professional if they continue or are bothersome): -breakthrough bleeding and spotting that continues beyond the 3 initial cycles of pills -breast tenderness -mood changes, anxiety, depression, frustration, anger, or emotional outbursts -increased sensitivity to sun or ultraviolet light -nausea -skin rash, acne, or brown spots on the skin -slight weight gain This list may not describe all possible side effects. Call your doctor for medical advice about side effects. You may report side effects to FDA at 1-800-FDA-1088. Where should I keep my medicine? Keep out of the reach of children. Store at room temperature between 15 and 30 degrees C (59 and 86 degrees F). Throw away any unused medicine after the expiration date. NOTE: This sheet is a summary. It may not cover all possible information. If you have questions about this medicine, talk to your doctor, pharmacist, or health care provider.  2019 Elsevier/Gold Standard (2016-06-25 08:06:39)  

## 2018-12-25 NOTE — Progress Notes (Signed)
HPI:      Kristen Galloway is a 23 y.o. G0P0000 who LMP was Patient's last menstrual period was 11/24/2018., she presents today for her annual examination. The patient has no complaints today. The patient is sexually active. Her last pap: approximate date 05/2018 and was normal and prior PAP abnormal with CIN II by biopsy and treated w Cryo in 11/2017. The patient does perform self breast exams.  There is no notable family history of breast or ovarian cancer in her family.  The patient has regular exercise: yes.  The patient denies current symptoms of depression.    GYN History: Contraception: OCP (estrogen/progesterone)  PMHx: Past Medical History:  Diagnosis Date  . Anemia   . Bacterial vaginosis   . Chronic pelvic pain in female 06/09/2015  . Dyspareunia in female   . Headache   . Ovarian cyst    Past Surgical History:  Procedure Laterality Date  . LAPAROSCOPY N/A 06/09/2015   Procedure: LAPAROSCOPY DIAGNOSTIC;  Surgeon: Conard Novak, MD;  Location: [facility] ORS;  Service: Gynecology;  Laterality: N/A;  . WISDOM TOOTH EXTRACTION     Family History  Problem Relation Age of Onset  . Endometriosis Mother   . Colon cancer Paternal Grandmother   . Endometriosis Maternal Grandmother   . Melanoma Maternal Grandmother   . Diabetes Maternal Grandfather   . Diabetes Paternal Grandfather   . Heart disease Paternal Grandfather    Social History   Tobacco Use  . Smoking status: Never Smoker  . Smokeless tobacco: Never Used  Substance Use Topics  . Alcohol use: Yes    Comment: occasional  . Drug use: No    Current Outpatient Medications:  .  ascorbic acid (VITAMIN C) 1000 MG tablet, Take 1,000 mg by mouth daily., Disp: , Rfl:  .  calcium-vitamin D (OSCAL WITH D) 500-200 MG-UNIT tablet, Take 1 tablet by mouth., Disp: , Rfl:  .  ferrous sulfate 325 (65 FE) MG EC tablet, Take 325 mg by mouth daily., Disp: , Rfl:  .  Lactobacillus (PROBIOTIC CHILDRENS) CHEW, Chew by mouth.,  Disp: , Rfl:  .  Multiple Vitamin (MULTI-VITAMINS) TABS, Take by mouth., Disp: , Rfl:  .  NORTREL 1/35, 28, tablet, Take 1 tablet by mouth daily., Disp: 84 tablet, Rfl: 4 .  TURMERIC PO, Take 500 mg by mouth. , Disp: , Rfl:  .  vitamin B-12 (CYANOCOBALAMIN) 1000 MCG tablet, Take by mouth., Disp: , Rfl:  Allergies: Cefzil [cefprozil] and Prednisone  Review of Systems  Constitutional: Negative for chills, fever and malaise/fatigue.  HENT: Negative for congestion, sinus pain and sore throat.   Eyes: Negative for blurred vision and pain.  Respiratory: Negative for cough and wheezing.   Cardiovascular: Negative for chest pain and leg swelling.  Gastrointestinal: Negative for abdominal pain, constipation, diarrhea, heartburn, nausea and vomiting.  Genitourinary: Negative for dysuria, frequency, hematuria and urgency.  Musculoskeletal: Negative for back pain, joint pain, myalgias and neck pain.  Skin: Negative for itching and rash.  Neurological: Negative for dizziness, tremors and weakness.  Endo/Heme/Allergies: Does not bruise/bleed easily.  Psychiatric/Behavioral: Negative for depression. The patient is not nervous/anxious and does not have insomnia.     Objective: BP 100/60   Ht 5\' 3"  (1.6 m)   Wt 170 lb (77.1 kg)   LMP 11/24/2018   BMI 30.11 kg/m   Filed Weights   12/25/18 1521  Weight: 170 lb (77.1 kg)   Body mass index is 30.11 kg/m. Physical Exam Constitutional:  General: She is not in acute distress.    Appearance: She is well-developed.  Genitourinary:     Pelvic exam was performed with patient supine.     Vagina, uterus and rectum normal.     No lesions in the vagina.     No vaginal bleeding.     No cervical motion tenderness, friability, lesion or polyp.     Uterus is mobile.     Uterus is not enlarged.     No uterine mass detected.    Uterus is midaxial.     No right or left adnexal mass present.     Right adnexa not tender.     Left adnexa not tender.    HENT:     Head: Normocephalic and atraumatic. No laceration.     Right Ear: Hearing normal.     Left Ear: Hearing normal.     Mouth/Throat:     Pharynx: Uvula midline.  Eyes:     Pupils: Pupils are equal, round, and reactive to light.  Neck:     Musculoskeletal: Normal range of motion and neck supple.     Thyroid: No thyromegaly.  Cardiovascular:     Rate and Rhythm: Normal rate and regular rhythm.     Heart sounds: No murmur. No friction rub. No gallop.   Pulmonary:     Effort: Pulmonary effort is normal. No respiratory distress.     Breath sounds: Normal breath sounds. No wheezing.  Chest:     Breasts:        Right: No mass, skin change or tenderness.        Left: No mass, skin change or tenderness.  Abdominal:     General: Bowel sounds are normal. There is no distension.     Palpations: Abdomen is soft.     Tenderness: There is no abdominal tenderness. There is no rebound.  Musculoskeletal: Normal range of motion.  Neurological:     Mental Status: She is alert and oriented to person, place, and time.     Cranial Nerves: No cranial nerve deficit.  Skin:    General: Skin is warm and dry.  Psychiatric:        Judgment: Judgment normal.  Vitals signs reviewed.   Assessment:  1. Dysplasia of cervix, high grade CIN 2   2. Women's annual routine gynecological examination    Screening Plan:            1.  Cervical Screening-  Pap smear done today  2. Breast screening- Exam annually and mammogram>40 planned   3. Colonoscopy every 10 years, Hemoccult testing - after age 34  4. Labs managed by PCP  5. Counseling for contraception: oral contraceptives (estrogen/progesterone)  Plans pregnancy maybe later this year; counseled as to how/when to stop OCPs, take PNV, and risks of endometriosis/PCOS on fertility  6. Dysplasia of cervix, high grade CIN 2 - Cytology - PAP    F/U  Return in about 6 months (around 06/25/2019) for Follow up PAP.  [name], MD,  [facility], [facility] 12/25/2018  3:52 PM

## 2018-12-29 LAB — CYTOLOGY - PAP: Diagnosis: NEGATIVE

## 2019-02-05 ENCOUNTER — Encounter: Payer: Self-pay | Admitting: Physician Assistant

## 2019-02-05 ENCOUNTER — Ambulatory Visit (INDEPENDENT_AMBULATORY_CARE_PROVIDER_SITE_OTHER): Payer: BC Managed Care – PPO | Admitting: Physician Assistant

## 2019-02-05 DIAGNOSIS — R110 Nausea: Secondary | ICD-10-CM

## 2019-02-05 DIAGNOSIS — L237 Allergic contact dermatitis due to plants, except food: Secondary | ICD-10-CM

## 2019-02-05 MED ORDER — ONDANSETRON HCL 4 MG PO TABS: 4.0000 mg | ORAL_TABLET | Freq: Three times a day (TID) | ORAL | 0 refills | Status: DC | PRN

## 2019-02-05 MED ORDER — METHYLPREDNISOLONE 4 MG PO TBPK: ORAL_TABLET | ORAL | 0 refills | Status: DC

## 2019-02-05 NOTE — Progress Notes (Signed)
Virtual Visit via Video Note  I connected with Kristen Galloway on 02/05/19 at  3:00 PM EDT by a video enabled telemedicine application and verified that I am speaking with the correct person using two identifiers.   I discussed the limitations of evaluation and management by telemedicine and the availability of in person appointments. The patient expressed understanding and agreed to proceed.   Margaretann Loveless, PA-C    Patient: Kristen Galloway Female    DOB: 09-12-1996   22 y.o.   MRN: [id number] Visit Date: 02/05/2019  Today's Provider: Margaretann Loveless, PA-C   Chief Complaint  Patient presents with  . Rash   Subjective:     Rash  This is a new problem. The current episode started in the past 7 days. The problem is unchanged. The rash is diffuse. The rash is characterized by redness, itchiness and blistering. She was exposed to plant contact.    Allergies  Allergen Reactions  . Cefzil [Cefprozil] Anaphylaxis  . Prednisone Nausea Only     Current Outpatient Medications:  .  ascorbic acid (VITAMIN C) 1000 MG tablet, Take 1,000 mg by mouth daily., Disp: , Rfl:  .  calcium-vitamin D (OSCAL WITH D) 500-200 MG-UNIT tablet, Take 1 tablet by mouth., Disp: , Rfl:  .  ferrous sulfate 325 (65 FE) MG EC tablet, Take 325 mg by mouth daily., Disp: , Rfl:  .  Lactobacillus (PROBIOTIC CHILDRENS) CHEW, Chew by mouth., Disp: , Rfl:  .  Multiple Vitamin (MULTI-VITAMINS) TABS, Take by mouth., Disp: , Rfl:  .  NORTREL 1/35, 28, tablet, Take 1 tablet by mouth daily., Disp: 84 tablet, Rfl: 4 .  TURMERIC PO, Take 500 mg by mouth. , Disp: , Rfl:  .  vitamin B-12 (CYANOCOBALAMIN) 1000 MCG tablet, Take by mouth., Disp: , Rfl:   Review of Systems  Constitutional: Negative.   Respiratory: Negative.   Cardiovascular: Negative.   Gastrointestinal: Negative.   Skin: Positive for rash.       Itching  Neurological: Negative.     Social History   Tobacco Use  . Smoking  status: Never Smoker  . Smokeless tobacco: Never Used  Substance Use Topics  . Alcohol use: Yes    Comment: occasional      Objective:   There were no vitals taken for this visit. There were no vitals filed for this visit.   Physical Exam Vitals signs reviewed.  Constitutional:      Appearance: She is well-developed.  HENT:     Head: Normocephalic and atraumatic.  Neck:     Musculoskeletal: Normal range of motion and neck supple.  Pulmonary:     Effort: Pulmonary effort is normal. No respiratory distress.  Skin:    Comments: Vesicular rash noted on the right ankle, reports also on left leg, both arms, chest, neck and face  Psychiatric:        Mood and Affect: Mood normal.        Behavior: Behavior normal.        Thought Content: Thought content normal.        Judgment: Judgment normal.         Assessment & Plan     1. Poison oak Will treat with medrol dose pak as below. Patient had nausea to prednisone. Benadryl prn for itching. Luke warm showers. Call if not improving. - methylPREDNISolone (MEDROL) 4 MG TBPK tablet; 6 day taper; take as directed on package instructions  Dispense: 21 tablet; Refill:  0  2. Nausea Zofran for nausea in case she gets it as well from the medrol. Did have as side effect from the prednisone in the past. - ondansetron (ZOFRAN) 4 MG tablet; Take 1 tablet (4 mg total) by mouth every 8 (eight) hours as needed.  Dispense: 20 tablet; Refill: 0    I discussed the assessment and treatment plan with the patient. The patient was provided an opportunity to ask questions and all were answered. The patient agreed with the plan and demonstrated an understanding of the instructions.   The patient was advised to call back or seek an in-person evaluation if the symptoms worsen or if the condition fails to improve as anticipated.  I provided 13 minutes of non-face-to-face time during this encounter.   Margaretann LovelessJennifer M Saniyya Gau, PA-C  [facility]  [facility]

## 2019-02-16 ENCOUNTER — Telehealth: Payer: Self-pay | Admitting: *Deleted

## 2019-02-16 ENCOUNTER — Telehealth: Payer: Self-pay | Admitting: Physician Assistant

## 2019-02-16 DIAGNOSIS — L237 Allergic contact dermatitis due to plants, except food: Secondary | ICD-10-CM

## 2019-02-16 MED ORDER — METHYLPREDNISOLONE 4 MG PO TBPK: ORAL_TABLET | ORAL | 0 refills | Status: DC

## 2019-02-16 MED ORDER — TRIAMCINOLONE ACETONIDE 0.1 % EX CREA: 1.0000 "application " | TOPICAL_CREAM | Freq: Two times a day (BID) | CUTANEOUS | 0 refills | Status: DC

## 2019-02-16 NOTE — Telephone Encounter (Signed)
Patient is asking if there's another medicine similar to Medrol for her to take. She reports that if there isn't that is ok she would go ahead and take it. Reports that you are aware of the side effects that she gets with it like the jittery,upset stomach and is afraid to take it again like last time and for the rash not to go away?

## 2019-02-16 NOTE — Telephone Encounter (Signed)
Pt said the prednisone makes her very nauseated.  She said she does not want to take this if there is something else that she can take.    CB# [phone]  Thanks Barth Kirks

## 2019-02-16 NOTE — Telephone Encounter (Signed)
There is not really. She can try the topical steroid alone first then take the medrol if not improving.

## 2019-02-16 NOTE — Telephone Encounter (Signed)
Just use the topical then. May take benadryl prn for itching

## 2019-02-16 NOTE — Telephone Encounter (Signed)
Patient advised as directed below. 

## 2019-02-16 NOTE — Telephone Encounter (Signed)
Will send in another round Medrol and have also sent in triamcinolone cream to use topically. Wash hands between applications to prevent spread.

## 2019-02-16 NOTE — Telephone Encounter (Signed)
Patient had a virtual visit 02/05/2019 for poison  [place]. Patient states she has finished the steroid she was given but she still has poison oak rash. Patient states rash continues to spread. Patient wants to know if she needs another visit or if she can try another medication? Please advise?

## 2019-02-23 ENCOUNTER — Telehealth: Payer: Self-pay | Admitting: Physician Assistant

## 2019-02-23 DIAGNOSIS — R21 Rash and other nonspecific skin eruption: Secondary | ICD-10-CM

## 2019-02-23 NOTE — Telephone Encounter (Signed)
Patient reports that the old poison oak areas are clearing up but she is beginning to see new spots. Please advise.

## 2019-02-23 NOTE — Telephone Encounter (Signed)
Since she has finished 2 rounds and having new lesions she must be getting continuously exposed. Does she have a pet that goes outside or is she herself going out?   I would recommend dermatology referral since she keeps getting new lesions. Referral placed.

## 2019-02-23 NOTE — Telephone Encounter (Signed)
Pt advised.   Thanks,   -Mitsugi Schrader  

## 2019-02-23 NOTE — Telephone Encounter (Signed)
Pt is still having symptoms after 2nd round of medication.  She was told to call back if she still had symptoms.  Please advise.  Thanks, [employer]

## 2019-02-23 NOTE — Telephone Encounter (Signed)
LVMTRC 

## 2019-04-09 ENCOUNTER — Other Ambulatory Visit: Payer: Self-pay | Admitting: Obstetrics & Gynecology

## 2019-04-09 ENCOUNTER — Telehealth: Payer: Self-pay

## 2019-04-09 DIAGNOSIS — N809 Endometriosis, unspecified: Secondary | ICD-10-CM

## 2019-04-09 DIAGNOSIS — R102 Pelvic and perineal pain: Secondary | ICD-10-CM

## 2019-04-09 NOTE — Telephone Encounter (Signed)
Let her know, yes of course.  Will put in referral today

## 2019-04-09 NOTE — Telephone Encounter (Signed)
lvm to advise pt

## 2019-04-09 NOTE — Telephone Encounter (Signed)
Pt want to know if she can have a referral to a pelvic floor physical therapist for her endometriosis. Pt aware RPH will be back in office Monday.

## 2019-06-02 ENCOUNTER — Telehealth: Payer: Self-pay

## 2019-06-02 NOTE — Telephone Encounter (Signed)
We can, only if that a specialist in female pelvic floor rehabilitation.  I do not know if they do.

## 2019-06-02 NOTE — Telephone Encounter (Signed)
Pt inquiring if PT referral can be sent to [facility] instead of [place].

## 2019-06-29 ENCOUNTER — Encounter: Payer: Self-pay | Admitting: Obstetrics & Gynecology

## 2019-06-29 ENCOUNTER — Other Ambulatory Visit: Payer: Self-pay

## 2019-06-29 ENCOUNTER — Other Ambulatory Visit (HOSPITAL_COMMUNITY)
Admission: RE | Admit: 2019-06-29 | Discharge: 2019-06-29 | Disposition: A | Discharge status: Home / Self Care | Payer: BC Managed Care – PPO | Source: Ambulatory Visit | Attending: Obstetrics & Gynecology | Admitting: Obstetrics & Gynecology

## 2019-06-29 ENCOUNTER — Ambulatory Visit (INDEPENDENT_AMBULATORY_CARE_PROVIDER_SITE_OTHER): Payer: BC Managed Care – PPO | Admitting: Obstetrics & Gynecology

## 2019-06-29 VITALS — BP 120/80 | Ht 63.0 in | Wt 173.0 lb

## 2019-06-29 DIAGNOSIS — N871 Moderate cervical dysplasia: Secondary | ICD-10-CM | POA: Diagnosis present

## 2019-06-29 NOTE — Progress Notes (Signed)
HPI:  Patient is a 23 y.o. G0P0000 presenting for follow up evaluation of abnormal PAP smear in the past.  Her last PAP was 6 months ago and was normal and also 05/2018 that was normal and prior PAP abnormal with CIN II by biopsy and treated w Cryo in 11/2017. She has had a prior colposcopy.  PMHx: She  has a past medical history of Anemia, Bacterial vaginosis, Chronic pelvic pain in female (06/09/2015), Dyspareunia in female, Headache, and Ovarian cyst. Also,  has a past surgical history that includes laparoscopy (N/A, 06/09/2015) and Wisdom tooth extraction., family history includes Colon cancer in her paternal grandmother; Diabetes in her maternal grandfather and paternal grandfather; Endometriosis in her maternal grandmother and mother; Heart disease in her paternal grandfather; Melanoma in her maternal grandmother.,  reports that she has never smoked. She has never used smokeless tobacco. She reports current alcohol use. She reports that she does not use drugs.  She has a current medication list which includes the following prescription(s): ascorbic acid, calcium-vitamin d, ferrous sulfate, probiotic childrens, methylprednisolone, multi-vitamins, nortrel 1/35 (28), ondansetron, triamcinolone cream, turmeric, and vitamin b-12. Also, is allergic to cefzil [cefprozil] and prednisone.  Review of Systems  All other systems reviewed and are negative.   Objective: BP 120/80   Ht 5\' 3"  (1.6 m)   Wt 173 lb (78.5 kg)   LMP 06/15/2019   BMI 30.65 kg/m  Filed Weights   06/29/19 1534  Weight: 173 lb (78.5 kg)   Body mass index is 30.65 kg/m.  Physical examination Physical Exam Constitutional:      General: She is not in acute distress.    Appearance: She is well-developed.  Genitourinary:     Pelvic exam was performed with patient supine.     Vagina and uterus normal.     No vaginal erythema or bleeding.     No cervical motion tenderness, discharge, polyp or nabothian cyst.     Uterus is  mobile.     Uterus is not enlarged.     No uterine mass detected.    Uterus is midaxial.     No right or left adnexal mass present.     Right adnexa not tender.     Left adnexa not tender.  HENT:     Head: Normocephalic and atraumatic.     Nose: Nose normal.  Abdominal:     General: There is no distension.     Palpations: Abdomen is soft.     Tenderness: There is no abdominal tenderness.  Musculoskeletal: Normal range of motion.  Neurological:     Mental Status: She is alert and oriented to person, place, and time.     Cranial Nerves: No cranial nerve deficit.  Skin:    General: Skin is warm and dry.     ASSESSMENT:  History of Cervical Dysplasia   ICD-10-CM   1. Dysplasia of cervix, high grade CIN 2  N87.1    Plan:  1.  I discussed the grading system of pap smears and HPV high risk viral types.   2. Follow up PAP 12 months if normal, vs intervention if high grade dysplasia identified. 3. Treatment of persistantly abnormal PAP smears and cervical dysplasia, even mild, is discussed w pt today in detail, as well as the pros and cons of Cryo and LEEP procedures. Will consider and discuss after results.  4. Also discussed fertility in near future.  Cont OCPs until ready to try. Prior Lap was neg for endometriosis.  Would plan, labs, HSG, SA if difficulty in conception.  PNV prior to stopping the OPCs.  [name] Harris, MD, [facility], [facility] 06/29/2019  3:41 PM

## 2019-06-30 LAB — CYTOLOGY - PAP: Diagnosis: NEGATIVE

## 2020-01-18 ENCOUNTER — Telehealth: Payer: Self-pay

## 2020-01-18 NOTE — Telephone Encounter (Signed)
Vitamins should be fine right?

## 2020-01-18 NOTE — Telephone Encounter (Signed)
Pt states she is starting to take Flow vitamins and wanting to make sure it is not something that will effect her birthcontrol.  CB# [phone]

## 2020-01-18 NOTE — Telephone Encounter (Signed)
Pt aware.

## 2020-02-26 ENCOUNTER — Other Ambulatory Visit: Payer: Self-pay

## 2020-06-24 ENCOUNTER — Other Ambulatory Visit: Payer: Self-pay

## 2020-06-24 ENCOUNTER — Encounter: Payer: Self-pay | Admitting: Emergency Medicine

## 2020-06-24 ENCOUNTER — Emergency Department
Admission: EM | Admit: 2020-06-24 | Discharge: 2020-06-24 | Disposition: A | Discharge status: Home / Self Care | Payer: BC Managed Care – PPO | Attending: Emergency Medicine | Admitting: Emergency Medicine

## 2020-06-24 ENCOUNTER — Emergency Department: Payer: BC Managed Care – PPO

## 2020-06-24 DIAGNOSIS — R1030 Lower abdominal pain, unspecified: Secondary | ICD-10-CM | POA: Diagnosis not present

## 2020-06-24 DIAGNOSIS — Z79899 Other long term (current) drug therapy: Secondary | ICD-10-CM | POA: Insufficient documentation

## 2020-06-24 DIAGNOSIS — R300 Dysuria: Secondary | ICD-10-CM | POA: Diagnosis present

## 2020-06-24 DIAGNOSIS — N201 Calculus of ureter: Secondary | ICD-10-CM

## 2020-06-24 LAB — URINALYSIS, COMPLETE (UACMP) WITH MICROSCOPIC
Bilirubin Urine: NEGATIVE
Glucose, UA: NEGATIVE mg/dL
Ketones, ur: NEGATIVE mg/dL
Leukocytes,Ua: NEGATIVE
Nitrite: POSITIVE — AB
Protein, ur: NEGATIVE mg/dL
Specific Gravity, Urine: 1.003 — ABNORMAL LOW (ref 1.005–1.030)
pH: 6 (ref 5.0–8.0)

## 2020-06-24 LAB — POC URINE PREG, ED: Preg Test, Ur: NEGATIVE

## 2020-06-24 MED ORDER — ONDANSETRON 4 MG PO TBDP
4.0000 mg | ORAL_TABLET | Freq: Once | ORAL | Status: AC
  Administered 2020-06-24: 4 mg via ORAL
  Filled 2020-06-24: qty 1

## 2020-06-24 MED ORDER — KETOROLAC TROMETHAMINE 30 MG/ML IJ SOLN
30.0000 mg | Freq: Once | INTRAMUSCULAR | Status: AC
  Administered 2020-06-24: 30 mg via INTRAMUSCULAR
  Filled 2020-06-24: qty 1

## 2020-06-24 MED ORDER — CIPROFLOXACIN HCL 500 MG PO TABS
500.0000 mg | ORAL_TABLET | Freq: Once | ORAL | Status: AC
  Administered 2020-06-24: 500 mg via ORAL
  Filled 2020-06-24: qty 1

## 2020-06-24 MED ORDER — ONDANSETRON 4 MG PO TBDP: 4.0000 mg | ORAL_TABLET | Freq: Three times a day (TID) | ORAL | 0 refills | Status: DC | PRN

## 2020-06-24 MED ORDER — NAPROXEN 500 MG PO TABS: 500.0000 mg | ORAL_TABLET | Freq: Two times a day (BID) | ORAL | 2 refills | Status: DC

## 2020-06-24 MED ORDER — HYDROCODONE-ACETAMINOPHEN 5-325 MG PO TABS: 1.0000 | ORAL_TABLET | ORAL | 0 refills | Status: DC | PRN

## 2020-06-24 MED ORDER — CIPROFLOXACIN HCL 500 MG PO TABS: 500.0000 mg | ORAL_TABLET | Freq: Two times a day (BID) | ORAL | 0 refills | Status: DC

## 2020-06-24 MED ORDER — TAMSULOSIN HCL 0.4 MG PO CAPS: 0.4000 mg | ORAL_CAPSULE | Freq: Every day | ORAL | 0 refills | Status: DC

## 2020-06-24 NOTE — ED Provider Notes (Signed)
[facility] Emergency Department Provider Note   ____________________________________________    I have reviewed the triage vital signs and the nursing notes.   HISTORY  Chief Complaint Flank Pain     HPI Kristen Galloway is a 24 y.o. female who presents with dysuria and some left flank discomfort. Patient reports approximately 4 days ago she developed mild dysuria, urinary frequency which more recently has developed into some lower abdominal cramping and left flank pain. She denies a history of kidney stone. She has had urinary tract infections before. Does have a history of endometriosis as well. Some nausea no vomiting. Has had chills, no fevers. Has taken Azo for this  Past Medical History:  Diagnosis Date  . Anemia   . Bacterial vaginosis   . Chronic pelvic pain in female 06/09/2015  . Dyspareunia in female   . Headache   . Ovarian cyst     Patient Active Problem List   Diagnosis Date Noted  . Dysplasia of cervix, high grade CIN 2 06/20/2018  . Low grade squamous intraepith lesion on cytologic smear cervix (lgsil) 11/15/2017  . Dysmenorrhea 09/25/2017  . Endometriosis 06/15/2015  . Migraines 06/15/2015  . Anemia 06/15/2015  . Avitaminosis D 06/15/2015  . Enlarged uterus 06/15/2015  . Ruptured ovarian cyst 06/15/2015  . Family history of colon cancer requiring screening colonoscopy 06/15/2015  . Menorrhagia with regular cycle 06/15/2015  . Chronic pelvic pain in female 06/09/2015    Past Surgical History:  Procedure Laterality Date  . LAPAROSCOPY N/A 06/09/2015   Procedure: LAPAROSCOPY DIAGNOSTIC;  Surgeon: Conard Novak, MD;  Location: [facility] ORS;  Service: Gynecology;  Laterality: N/A;  . WISDOM TOOTH EXTRACTION      Prior to Admission medications   Medication Sig Start Date End Date Taking? Authorizing Provider  ascorbic acid (VITAMIN C) 1000 MG tablet Take 1,000 mg by mouth daily.    [provider]    calcium-vitamin D (OSCAL WITH D) 500-200 MG-UNIT tablet Take 1 tablet by mouth.    [provider]  ferrous sulfate 325 (65 FE) MG EC tablet Take 325 mg by mouth daily.    [provider]  Lactobacillus (PROBIOTIC CHILDRENS) CHEW Chew by mouth.    [provider]  methylPREDNISolone (MEDROL) 4 MG TBPK tablet 6 day taper; take as directed on package instructions 02/16/19   Margaretann Loveless, PA-C  Multiple Vitamin (MULTI-VITAMINS) TABS Take by mouth.    [provider]  NORTREL 1/35, 28, tablet Take 1 tablet by mouth daily. 12/25/18   Nadara Mustard, MD  ondansetron (ZOFRAN) 4 MG tablet Take 1 tablet (4 mg total) by mouth every 8 (eight) hours as needed. 02/05/19   Margaretann Loveless, PA-C  triamcinolone cream (KENALOG) 0.1 % Apply 1 application topically 2 (two) times daily. 02/16/19   Margaretann Loveless, PA-C  TURMERIC PO Take 500 mg by mouth.     [provider]  vitamin B-12 (CYANOCOBALAMIN) 1000 MCG tablet Take by mouth.    [provider]     Allergies Cefzil [cefprozil] and Prednisone  Family History  Problem Relation Age of Onset  . Endometriosis Mother   . Colon cancer Paternal Grandmother   . Endometriosis Maternal Grandmother   . Melanoma Maternal Grandmother   . Diabetes Maternal Grandfather   . Diabetes Paternal Grandfather   . Heart disease Paternal Grandfather     Social History Social History   Tobacco Use  . Smoking status: Never Smoker  .  Smokeless tobacco: Never Used  Vaping Use  . Vaping Use: Never used  Substance Use Topics  . Alcohol use: Yes    Comment: occasional  . Drug use: No    Review of Systems  Constitutional: Chills as above Eyes: No visual changes.  ENT: No sore throat. Cardiovascular: Denies chest pain. Respiratory: Denies shortness of breath. Gastrointestinal: As above Genitourinary: As above Musculoskeletal: Negative for back pain. Skin: Negative for rash. Neurological:  Negative for headaches or weakness   ____________________________________________   PHYSICAL EXAM:  VITAL SIGNS: ED Triage Vitals  Enc Vitals Group     BP 06/24/20 1141 132/68     Pulse Rate 06/24/20 1141 (!) 104     Resp 06/24/20 1141 16     Temp 06/24/20 1141 97.6 F (36.4 C)     Temp Source 06/24/20 1141 Oral     SpO2 06/24/20 1141 97 %     Weight 06/24/20 1118 74.8 kg (165 lb)     Height 06/24/20 1118 1.6 m (5\' 3" )     Head Circumference --      Peak Flow --      Pain Score 06/24/20 1118 5     Pain Loc --      Pain Edu? --      Excl. in GC? --     Constitutional: Alert and oriented. No acute distress.  Eyes: Conjunctivae are normal.   Nose: No congestion/rhinnorhea. Mouth/Throat: Mucous membranes are moist.    Cardiovascular: Normal rate, regular rhythm.  Good peripheral circulation. Respiratory: Normal respiratory effort.  No retractions. Lungs CTAB. Gastrointestinal: Soft and nontender. No distention. Mild left CVA tenderness Musculoskeletal:   Warm and well perfused Neurologic:  Normal speech and language. No gross focal neurologic deficits are appreciated.  Skin:  Skin is warm, dry and intact. No rash noted. Psychiatric: Mood and affect are normal. Speech and behavior are normal.  ____________________________________________   LABS (all labs ordered are listed, but only abnormal results are displayed)  Labs Reviewed  URINALYSIS, COMPLETE (UACMP) WITH MICROSCOPIC - Abnormal; Notable for the following components:      Result Value   Color, Urine AMBER (*)    APPearance CLEAR (*)    Specific Gravity, Urine 1.003 (*)    Hgb urine dipstick SMALL (*)    Nitrite POSITIVE (*)    Bacteria, UA RARE (*)    All other components within normal limits  POC URINE PREG, ED - Normal  URINE CULTURE   ____________________________________________  EKG  None ____________________________________________  RADIOLOGY  CT renal stone  study ____________________________________________   PROCEDURES  Procedure(s) performed: No  Procedures   Critical Care performed: No ____________________________________________   INITIAL IMPRESSION / ASSESSMENT AND PLAN / ED COURSE  Pertinent labs & imaging results that were available during my care of the patient were reviewed by me and considered in my medical decision making (see chart for details).  Patient presents with dysuria left leg discomfort as noted above.  Differential includes urinary tract infection, pyelonephritis, ureterolithiasis, less likely diverticulitis  Urinalysis demonstrates positive nitrates, suspicious for urinary tract infection also small mount of blood, will send for CT renal stone study.  Patient treated with IM Toradol, p.o. Zofran    ____________________________________________   FINAL CLINICAL IMPRESSION(S) / ED DIAGNOSES  Final diagnoses:  None        Note:  This document was prepared using Dragon voice recognition software and may include unintentional dictation errors.   06/26/20, MD 06/24/20 1319

## 2020-06-24 NOTE — ED Notes (Signed)
See triage note  Presents with left flank/back pain  States pain started 4 days ago   Pain is non radiating   She is also having some dysuria

## 2020-06-24 NOTE — ED Triage Notes (Signed)
Sent by doctor for possible kidney problem

## 2020-06-24 NOTE — ED Triage Notes (Signed)
Left flank pain for 4 days. n o fever.

## 2020-06-25 LAB — URINE CULTURE: Culture: <10000 — AB

## 2020-06-29 ENCOUNTER — Ambulatory Visit: Payer: BC Managed Care – PPO | Admitting: Obstetrics & Gynecology

## 2020-07-06 ENCOUNTER — Ambulatory Visit: Payer: BC Managed Care – PPO | Admitting: Urology

## 2020-07-25 ENCOUNTER — Other Ambulatory Visit (HOSPITAL_COMMUNITY)
Admission: RE | Admit: 2020-07-25 | Discharge: 2020-07-25 | Disposition: A | Discharge status: Home / Self Care | Payer: BC Managed Care – PPO | Source: Ambulatory Visit | Attending: Obstetrics & Gynecology | Admitting: Obstetrics & Gynecology

## 2020-07-25 ENCOUNTER — Encounter: Payer: Self-pay | Admitting: Obstetrics & Gynecology

## 2020-07-25 ENCOUNTER — Ambulatory Visit (INDEPENDENT_AMBULATORY_CARE_PROVIDER_SITE_OTHER): Payer: BC Managed Care – PPO | Admitting: Obstetrics & Gynecology

## 2020-07-25 ENCOUNTER — Other Ambulatory Visit: Payer: Self-pay

## 2020-07-25 VITALS — BP 122/80 | Ht 63.0 in | Wt 176.0 lb

## 2020-07-25 DIAGNOSIS — Z131 Encounter for screening for diabetes mellitus: Secondary | ICD-10-CM

## 2020-07-25 DIAGNOSIS — Z1321 Encounter for screening for nutritional disorder: Secondary | ICD-10-CM

## 2020-07-25 DIAGNOSIS — Z0000 Encounter for general adult medical examination without abnormal findings: Secondary | ICD-10-CM | POA: Diagnosis not present

## 2020-07-25 DIAGNOSIS — Z1329 Encounter for screening for other suspected endocrine disorder: Secondary | ICD-10-CM

## 2020-07-25 DIAGNOSIS — Z01419 Encounter for gynecological examination (general) (routine) without abnormal findings: Secondary | ICD-10-CM | POA: Insufficient documentation

## 2020-07-25 DIAGNOSIS — Z87410 Personal history of cervical dysplasia: Secondary | ICD-10-CM | POA: Diagnosis not present

## 2020-07-25 DIAGNOSIS — N871 Moderate cervical dysplasia: Secondary | ICD-10-CM

## 2020-07-25 DIAGNOSIS — Z13220 Encounter for screening for lipoid disorders: Secondary | ICD-10-CM

## 2020-07-25 MED ORDER — NORTREL 1/35 (28) 1-35 MG-MCG PO TABS: 1.0000 | ORAL_TABLET | Freq: Every day | ORAL | 3 refills | Status: DC

## 2020-07-25 NOTE — Progress Notes (Signed)
HPI:      [name] is a 24 y.o. G0P0000 who LMP was Patient's last menstrual period was 07/11/2020., she presents today for her annual examination. The patient has no complaints today.  No pain from endometriosis reported.  Getting married next month.  The patient is sexually active. Her last pap: approximate date 2020 and was normal.  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 The patient does perform self breast exams.  There is no notable family history of breast or ovarian cancer in her family.  The patient has regular exercise: yes.  The patient denies current symptoms of depression.    GYN History: Contraception: OCP (estrogen/progesterone)  PMHx: Past Medical History:  Diagnosis Date  . Anemia   . Bacterial vaginosis   . Chronic pelvic pain in female 06/09/2015  . Dyspareunia in female   . Headache   . Ovarian cyst    Past Surgical History:  Procedure Laterality Date  . LAPAROSCOPY N/A 06/09/2015   Procedure: LAPAROSCOPY DIAGNOSTIC;  Surgeon: Conard Novak, MD;  Location: [facility] ORS;  Service: Gynecology;  Laterality: N/A;  . WISDOM TOOTH EXTRACTION     Family History  Problem Relation Age of Onset  . Endometriosis Mother   . Colon cancer Paternal Grandmother   . Endometriosis Maternal Grandmother   . Melanoma Maternal Grandmother   . Diabetes Maternal Grandfather   . Diabetes Paternal Grandfather   . Heart disease Paternal Grandfather    Social History   Tobacco Use  . Smoking status: Never Smoker  . Smokeless tobacco: Never Used  Vaping Use  . Vaping Use: Never used  Substance Use Topics  . Alcohol use: Yes    Comment: occasional  . Drug use: No    Current Outpatient Medications:  .  ascorbic acid (VITAMIN C) 1000 MG tablet, Take 1,000 mg by mouth daily., Disp: , Rfl:  .  calcium-vitamin D (OSCAL WITH D) 500-200 MG-UNIT tablet, Take 1 tablet by mouth., Disp: , Rfl:  .  ferrous sulfate 325 (65 FE) MG EC tablet, Take 325 mg by mouth daily., Disp: ,  Rfl:  .  Lactobacillus (PROBIOTIC CHILDRENS) CHEW, Chew by mouth., Disp: , Rfl:  .  Multiple Vitamin (MULTI-VITAMINS) TABS, Take by mouth., Disp: , Rfl:  .  NORTREL 1/35, 28, tablet, Take 1 tablet by mouth daily., Disp: 84 tablet, Rfl: 3 .  TURMERIC PO, Take 500 mg by mouth. , Disp: , Rfl:  .  vitamin B-12 (CYANOCOBALAMIN) 1000 MCG tablet, Take by mouth., Disp: , Rfl:  Allergies: Cefzil [cefprozil] and Prednisone  Review of Systems  Constitutional: Negative for chills, fever and malaise/fatigue.  HENT: Negative for congestion, sinus pain and sore throat.   Eyes: Negative for blurred vision and pain.  Respiratory: Negative for cough and wheezing.   Cardiovascular: Negative for chest pain and leg swelling.  Gastrointestinal: Negative for abdominal pain, constipation, diarrhea, heartburn, nausea and vomiting.  Genitourinary: Negative for dysuria, frequency, hematuria and urgency.  Musculoskeletal: Negative for back pain, joint pain, myalgias and neck pain.  Skin: Negative for itching and rash.  Neurological: Negative for dizziness, tremors and weakness.  Endo/Heme/Allergies: Does not bruise/bleed easily.  Psychiatric/Behavioral: Negative for depression. The patient is not nervous/anxious and does not have insomnia.     Objective: BP 122/80   Ht 5\' 3"  (1.6 m)   Wt 176 lb (79.8 kg)   LMP 07/11/2020   BMI 31.18 kg/m   Filed Weights   07/25/20 1458  Weight:  176 lb (79.8 kg)   Body mass index is 31.18 kg/m. Physical Exam Constitutional:      General: She is not in acute distress.    Appearance: She is well-developed.  Genitourinary:     Pelvic exam was performed with patient supine.     Vagina, uterus and rectum normal.     No lesions in the vagina.     No vaginal bleeding.     No cervical motion tenderness, friability, lesion or polyp.     Uterus is mobile.     Uterus is not enlarged.     No uterine mass detected.    Uterus is midaxial.     No right or left adnexal mass  present.     Right adnexa not tender.     Left adnexa not tender.  HENT:     Head: Normocephalic and atraumatic. No laceration.     Right Ear: Hearing normal.     Left Ear: Hearing normal.     Mouth/Throat:     Pharynx: Uvula midline.  Eyes:     Pupils: Pupils are equal, round, and reactive to light.  Neck:     Thyroid: No thyromegaly.  Cardiovascular:     Rate and Rhythm: Normal rate and regular rhythm.     Heart sounds: No murmur heard.  No friction rub. No gallop.   Pulmonary:     Effort: Pulmonary effort is normal. No respiratory distress.     Breath sounds: Normal breath sounds. No wheezing.  Chest:     Breasts:        Right: No mass, skin change or tenderness.        Left: No mass, skin change or tenderness.  Abdominal:     General: Bowel sounds are normal. There is no distension.     Palpations: Abdomen is soft.     Tenderness: There is no abdominal tenderness. There is no rebound.  Musculoskeletal:        General: Normal range of motion.     Cervical back: Normal range of motion and neck supple.  Neurological:     Mental Status: She is alert and oriented to person, place, and time.     Cranial Nerves: No cranial nerve deficit.  Skin:    General: Skin is warm and dry.  Psychiatric:        Judgment: Judgment normal.  Vitals reviewed.     Assessment:  ANNUAL EXAM 1. Women's annual routine gynecological examination   2. Dysplasia of cervix, high grade CIN 2   3. Annual physical exam   4. Screening for thyroid disorder   5. Screening for diabetes mellitus   6. Screening for cholesterol level   7. Encounter for vitamin deficiency screening      Screening Plan:            1.  Cervical Screening-  Pap smear done today Prior CIN 2, treated w cryo 2019  2. Breast screening- Exam annually and mammogram>40 planned   3. Colonoscopy every 10 years, Hemoccult testing - after age 5  4. Labs To return fasting at a later date  5. Counseling for contraception:  oral contraceptives (estrogen/progesterone)  The pregnancy intention screening data noted above was reviewed. Potential methods of contraception were discussed. The patient elected to proceed with Oral Contraceptive and Pregnant/Seeking Pregnancy.  Pregnancy desires in 1-3 years - NORTREL 1/35, 28, tablet; Take 1 tablet by mouth daily.  Dispense: 84 tablet; Refill: 3  F/U  Return in about 1 year (around 07/25/2021) for Annual, also LAB APPT soon (fasting).  [name], MD, [facility], [facility] 07/25/2020  3:45 PM

## 2020-07-25 NOTE — Patient Instructions (Signed)
PAP every year Labs soon (fasting)  Thank you for choosing [facility]. As part of our ongoing efforts to improve patient experience, we would appreciate your feedback. Please fill out the short survey that you will receive by mail or MyChart. Your opinion is important to [place]! - Dr. Tiburcio Pea

## 2020-07-28 LAB — CYTOLOGY - PAP
Adequacy: ABSENT
Chlamydia: NEGATIVE
Comment: NEGATIVE
Comment: NORMAL
Diagnosis: NEGATIVE
Neisseria Gonorrhea: NEGATIVE

## 2020-09-12 ENCOUNTER — Other Ambulatory Visit: Payer: Self-pay

## 2020-09-12 ENCOUNTER — Other Ambulatory Visit: Payer: BC Managed Care – PPO

## 2020-09-12 DIAGNOSIS — Z1329 Encounter for screening for other suspected endocrine disorder: Secondary | ICD-10-CM

## 2020-09-12 DIAGNOSIS — Z1321 Encounter for screening for nutritional disorder: Secondary | ICD-10-CM

## 2020-09-12 DIAGNOSIS — Z131 Encounter for screening for diabetes mellitus: Secondary | ICD-10-CM

## 2020-09-12 DIAGNOSIS — Z13220 Encounter for screening for lipoid disorders: Secondary | ICD-10-CM

## 2020-09-13 LAB — LIPID PANEL
Chol/HDL Ratio: 3.6 ratio (ref 0.0–4.4)
Cholesterol, Total: 195 mg/dL (ref 100–199)
HDL: 54 mg/dL (ref >39)
LDL Chol Calc (NIH): 124 mg/dL — ABNORMAL HIGH (ref 0–99)
Triglycerides: 96 mg/dL (ref 0–149)
VLDL Cholesterol Cal: 17 mg/dL (ref 5–40)

## 2020-09-13 LAB — GLUCOSE, FASTING: Glucose, Plasma: 85 mg/dL (ref 65–99)

## 2020-09-13 LAB — TSH: TSH: 2.03 u[IU]/mL (ref 0.450–4.500)

## 2020-09-13 LAB — VITAMIN D 25 HYDROXY (VIT D DEFICIENCY, FRACTURES): Vit D, 25-Hydroxy: 35.5 ng/mL (ref 30.0–100.0)

## 2021-04-10 IMAGING — CT CT RENAL STONE PROTOCOL
3 of 4 series · 8 of 46 positions shown, 15 images · non-contrast
Comparison: None

CLINICAL DATA: Flank pain for 4 days question kidney stone

EXAM:
CT ABDOMEN AND PELVIS WITHOUT CONTRAST
TECHNIQUE: Multidetector CT imaging of the abdomen and pelvis was performed
following the standard protocol without IV contrast. Sagittal and
coronal MPR images reconstructed from axial data set. Oral contrast
not administered for this indication.

[Series 4: lung bases · axial · 0.67mm/px · z∈[+14,+69]mm · 4 of 19 slices shown, 9 images]
[im 4/19  soft-tissue]
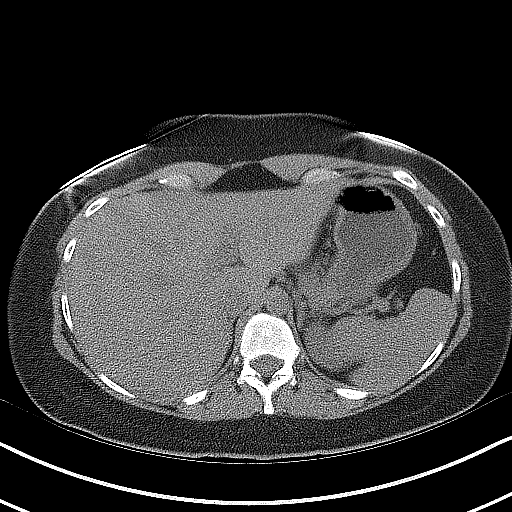
[im 4/19  lung]
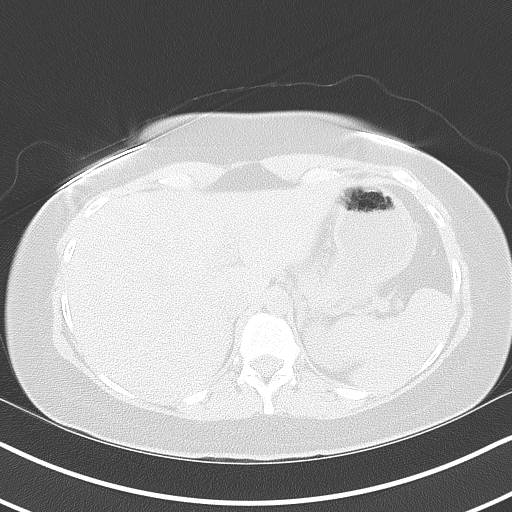
[im 4/19  bone]
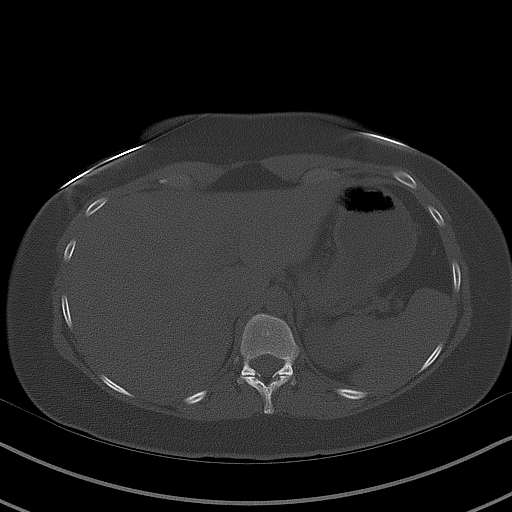
[im 8/19  soft-tissue]
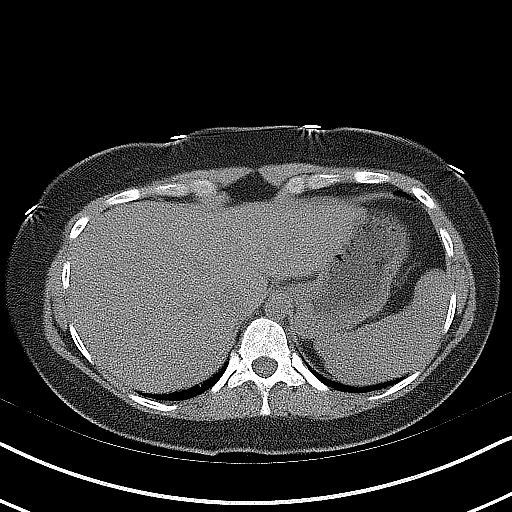
[im 8/19  lung]
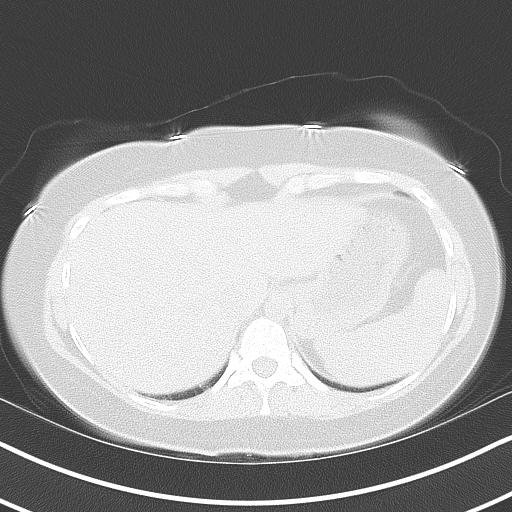
[im 11/19  soft-tissue]
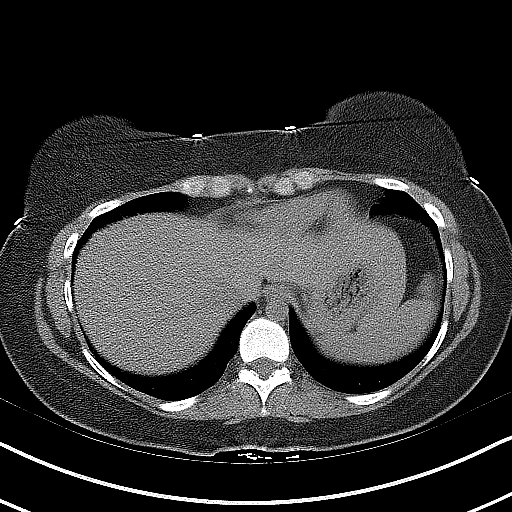
[im 11/19  lung]
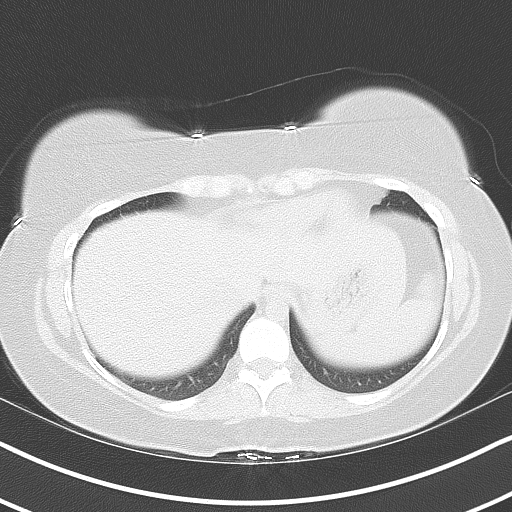
[im 15/19  soft-tissue]
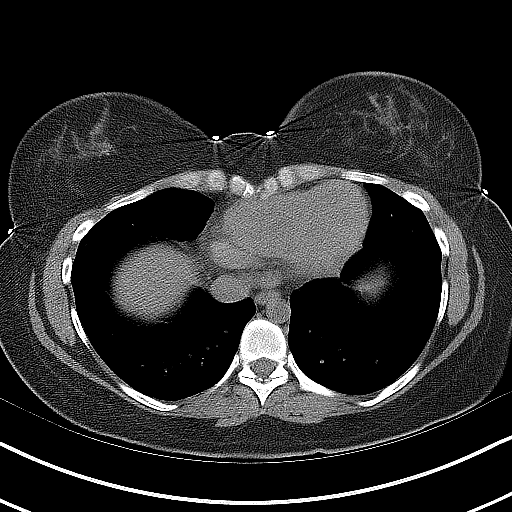
[im 15/19  lung]
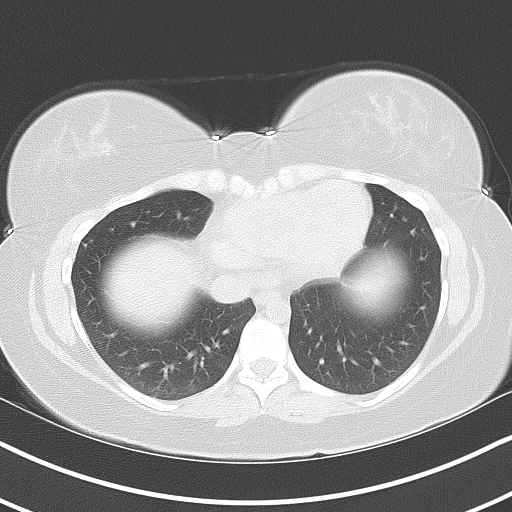

[Series 5: coronal · coronal · 0.66mm/px · 3 of 145 slices shown, 4 images]
[im 49/145  soft-tissue]
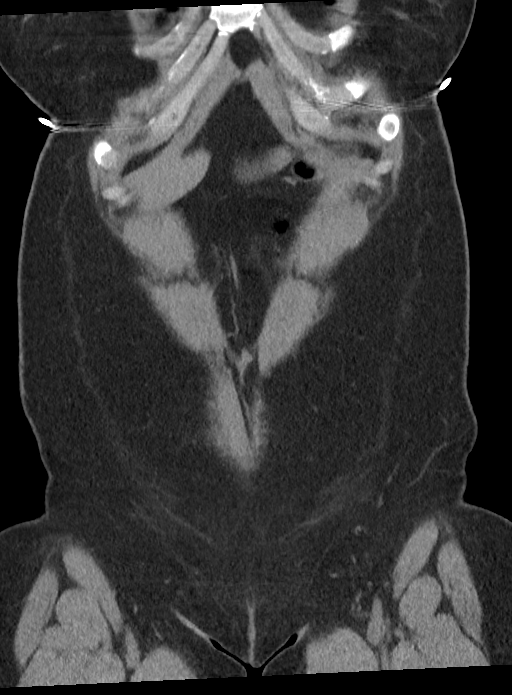
[im 65/145  soft-tissue]
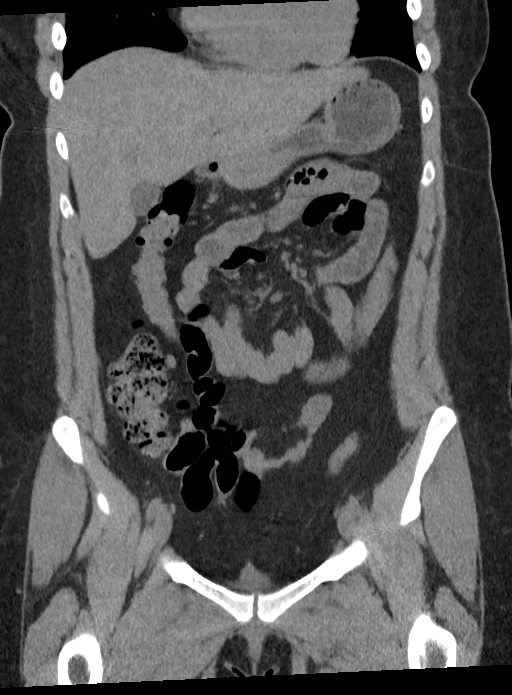
[im 65/145  bone]
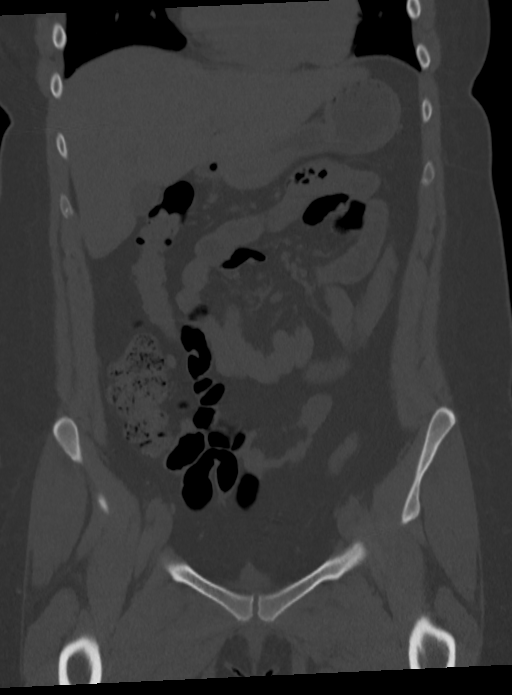
[im 81/145  soft-tissue]
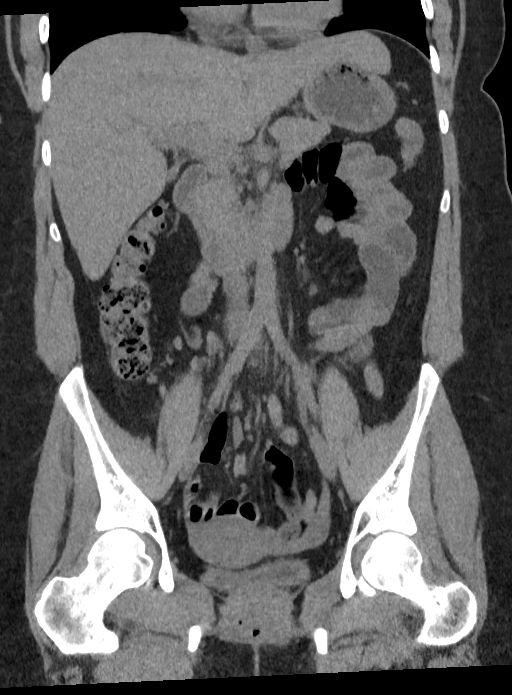

[Series 6: sagittal · sagittal · 0.47mm/px · 1 of 221 slices shown, 2 images]
[im 74/221  soft-tissue]
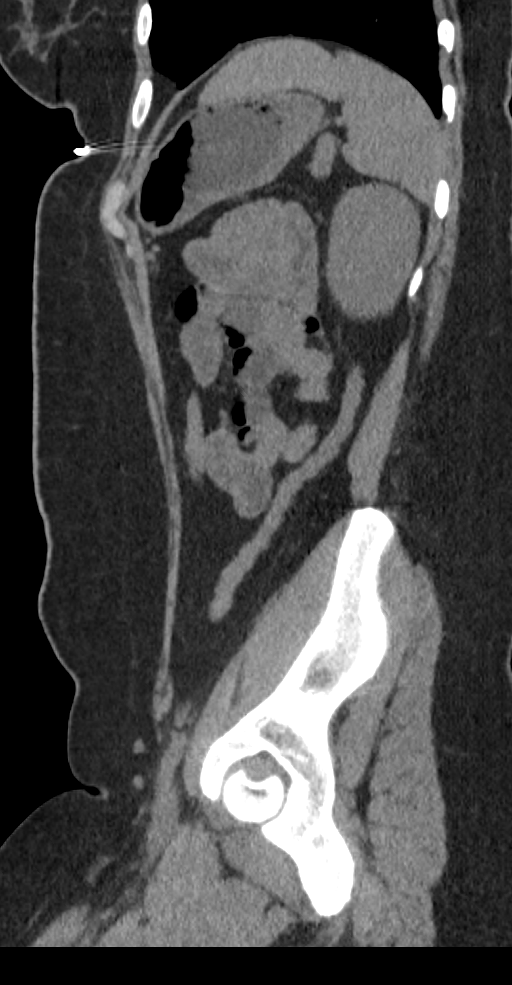
[im 74/221  bone]
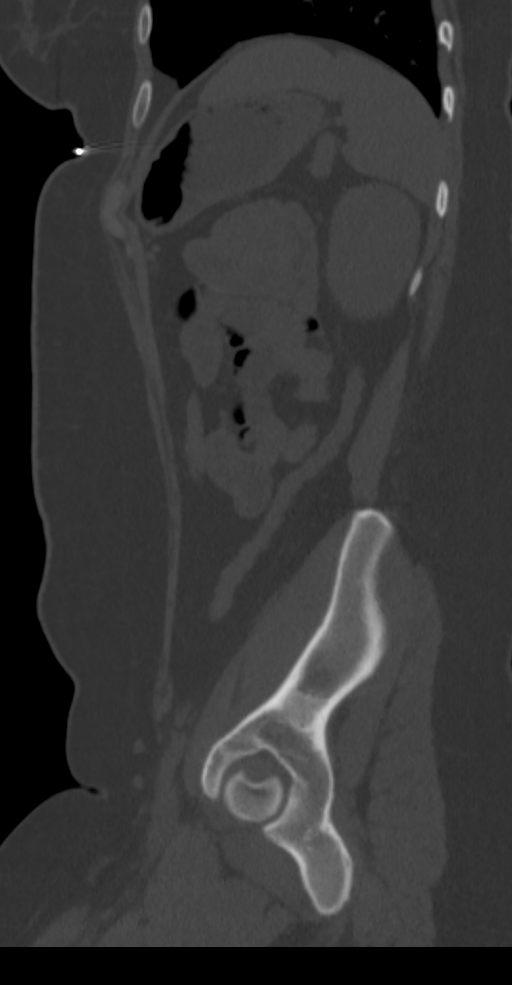

[8 of 46 positions shown; findings below may reference images not displayed]

FINDINGS: Lower chest: Lung bases clear

Hepatobiliary: Gallbladder and liver normal appearance

Pancreas: Normal appearance

Spleen: Normal appearance

Adrenals/Urinary Tract: Adrenal glands and kidneys normal
appearance. Nondilated ureters. 4 x 3 mm calculus at LEFT
ureterovesical junction. Bladder otherwise unremarkable.

Stomach/Bowel: Normal appendix. Stomach and bowel loops normal
appearance for technique

Vascular/Lymphatic: Aorta normal caliber.  No adenopathy.

Reproductive: Normal appearing uterus and LEFT ovary. Dominant
follicle RIGHT ovary 2.7 x 2.4 cm.

Other: No free air or free fluid. No hernia or inflammatory process.

Musculoskeletal: Osseous structures unremarkable.
IMPRESSION: 4 x 3 mm LEFT ureterovesical junction calculus without LEFT
hydronephrosis or hydroureter.

Remainder of exam unremarkable.

## 2021-05-18 ENCOUNTER — Ambulatory Visit: Payer: Self-pay | Admitting: *Deleted

## 2021-05-18 NOTE — Telephone Encounter (Signed)
Woke up with temp 100.9 oral this morning with N/V, vomited X1. Abdominal cramps mild constant changes from mild to intense-3 on the scale. LBM this morning, no diarrhea. Attempted liquids an hour ago-did not keep down. Voiding little but without difficulty. No one else sick in home. Care advice provided for stomachache per protocol. Patient stated understanding advice.   Reason for Disposition  [1] MILD-MODERATE pain AND [2] comes and goes (cramps)  Answer Assessment - Initial Assessment Questions 1. LOCATION: "Where does it hurt?"      Lower and center 2. RADIATION: "Does the pain shoot anywhere else?" (e.g., chest, back)    Not at this time 3. ONSET: "When did the pain begin?" (e.g., minutes, hours or days ago)     During the night 4. SUDDEN: "Gradual or sudden onset?"     constant 5. PATTERN "Does the pain come and go, or is it constant?"    - If constant: "Is it getting better, staying the same, or worsening?"      (Note: Constant means the pain never goes away completely; most serious pain is constant and it progresses)     - If intermittent: "How long does it last?" "Do you have pain now?"     (Note: Intermittent means the pain goes away completely between bouts)     mild 6. SEVERITY: "How bad is the pain?"  (e.g., Scale 1-10; mild, moderate, or severe)   - MILD (1-3): doesn't interfere with normal activities, abdomen soft and not tender to touch    - MODERATE (4-7): interferes with normal activities or awakens from sleep, abdomen tender to touch    - SEVERE (8-10): excruciating pain, doubled over, unable to do any normal activities      3 7. RECURRENT SYMPTOM: "Have you ever had this type of stomach pain before?" If Yes, ask: "When was the last time?" and "What happened that time?"      Yes, bacterial gastroenteritis 8. CAUSE: "What do you think is causing the stomach pain?"     unknown 9. RELIEVING/AGGRAVATING FACTORS: "What makes it better or worse?" (e.g., movement, antacids,  bowel movement)     movement 10. OTHER SYMPTOMS: "Do you have any other symptoms?" (e.g., back pain, diarrhea, fever, urination pain, vomiting)       Temp 100.9 oral, vomiting x1 11. PREGNANCY: "Is there any chance you are pregnant?" "When was your last menstrual period?"       On cycle now  Protocols used: Abdominal Pain - [facility]

## 2021-06-13 ENCOUNTER — Telehealth: Payer: Self-pay

## 2021-06-13 NOTE — Telephone Encounter (Signed)
Pt calling; is late to start her period which is okay; left lower back pain; went off bc last month; concern or see what happens.  [phone]  Pt states LLB pain started yesterday but has gotten worse; states she does have a hx of kidney stones; has not contacted urologist yet; adv to contact urologist as her going off bc probably has nothing to do with LLB pain and going off bc; pt concerned about ectopic; adv too soon for that. Pt to contact her urologist.

## 2021-07-31 ENCOUNTER — Other Ambulatory Visit: Payer: Self-pay

## 2021-07-31 ENCOUNTER — Encounter: Payer: Self-pay | Admitting: Obstetrics & Gynecology

## 2021-07-31 ENCOUNTER — Ambulatory Visit (INDEPENDENT_AMBULATORY_CARE_PROVIDER_SITE_OTHER): Payer: BC Managed Care – PPO | Admitting: Obstetrics & Gynecology

## 2021-07-31 ENCOUNTER — Other Ambulatory Visit (HOSPITAL_COMMUNITY)
Admission: RE | Admit: 2021-07-31 | Discharge: 2021-07-31 | Disposition: A | Discharge status: Home / Self Care | Payer: BC Managed Care – PPO | Source: Ambulatory Visit | Attending: Obstetrics & Gynecology | Admitting: Obstetrics & Gynecology

## 2021-07-31 VITALS — BP 120/80 | Ht 63.0 in | Wt 178.0 lb

## 2021-07-31 DIAGNOSIS — Z01419 Encounter for gynecological examination (general) (routine) without abnormal findings: Secondary | ICD-10-CM

## 2021-07-31 DIAGNOSIS — N871 Moderate cervical dysplasia: Secondary | ICD-10-CM | POA: Diagnosis not present

## 2021-07-31 NOTE — Patient Instructions (Signed)
Thank you for choosing [facility]. As part of our ongoing efforts to improve patient experience, we would appreciate your feedback. Please fill out the short survey that you will receive by mail or MyChart. Your opinion is important to us! -Dr Aqua Denslow  Recommendations to boost your immunity to prevent illness such as viral flu and colds, including covid19, are as follows:       - - -  Vitamin K2 and Vitamin D3  - - - Take Vitamin K2 at 200-300 mcg daily (usually 2-3 pills daily of the over the counter formulation). Take Vitamin D3 at 3000-4000 U daily (usually 3-4 pills daily of the over the counter formulation). Studies show that these two at high normal levels in your system are very effective in keeping your immunity so strong and protective that you will be unlikely to contract viral illness such as those listed above.  Dr Abisola Carrero  

## 2021-07-31 NOTE — Progress Notes (Signed)
HPI:      [name] is a 25 y.o. G0P0000 who LMP was Patient's last menstrual period was 07/03/2021., she presents today for her annual examination. The patient has no complaints today. Trying for pregnancy (since July).  Has h/o ENDOMETRIOSIS, and still has some chronic RLQ pain, worse w periods. The patient is sexually active. Her last pap: approximate date 2021 and was normal and has had CIN2 w Cryo 2019 . The patient does perform self breast exams.  There is no notable family history of breast or ovarian cancer in her family.  The patient has regular exercise: yes.  The patient denies current symptoms of depression.    GYN History: Contraception: none  PMHx: Past Medical History:  Diagnosis Date   Anemia    Bacterial vaginosis    Chronic pelvic pain in female 06/09/2015   Dyspareunia in female    Headache    Ovarian cyst    Past Surgical History:  Procedure Laterality Date   LAPAROSCOPY N/A 06/09/2015   Procedure: LAPAROSCOPY DIAGNOSTIC;  Surgeon: Conard Novak, MD;  Location: [facility] ORS;  Service: Gynecology;  Laterality: N/A;   WISDOM TOOTH EXTRACTION     Family History  Problem Relation Age of Onset   Endometriosis Mother    Colon cancer Paternal Grandmother    Endometriosis Maternal Grandmother    Melanoma Maternal Grandmother    Diabetes Maternal Grandfather    Diabetes Paternal Grandfather    Heart disease Paternal Grandfather    Social History   Tobacco Use   Smoking status: Never   Smokeless tobacco: Never  Vaping Use   Vaping Use: Never used  Substance Use Topics   Alcohol use: Yes    Comment: occasional   Drug use: No    Current Outpatient Medications:    ascorbic acid (VITAMIN C) 1000 MG tablet, Take 1,000 mg by mouth daily., Disp: , Rfl:    calcium-vitamin D (OSCAL WITH D) 500-200 MG-UNIT tablet, Take 1 tablet by mouth., Disp: , Rfl:    ferrous sulfate 325 (65 FE) MG EC tablet, Take 325 mg by mouth daily., Disp: , Rfl:     Lactobacillus (PROBIOTIC CHILDRENS) CHEW, Chew by mouth., Disp: , Rfl:    Multiple Vitamin (MULTI-VITAMINS) TABS, Take by mouth., Disp: , Rfl:    TURMERIC PO, Take 500 mg by mouth. , Disp: , Rfl:    vitamin B-12 (CYANOCOBALAMIN) 1000 MCG tablet, Take by mouth., Disp: , Rfl:    NORTREL 1/35, 28, tablet, Take 1 tablet by mouth daily. (Patient not taking: Reported on 07/31/2021), Disp: 84 tablet, Rfl: 3 Allergies: Cefzil [cefprozil] and Prednisone  Review of Systems  Constitutional:  Negative for chills, fever and malaise/fatigue.  HENT:  Negative for congestion, sinus pain and sore throat.   Eyes:  Negative for blurred vision and pain.  Respiratory:  Negative for cough and wheezing.   Cardiovascular:  Negative for chest pain and leg swelling.  Gastrointestinal:  Negative for abdominal pain, constipation, diarrhea, heartburn, nausea and vomiting.  Genitourinary:  Negative for dysuria, frequency, hematuria and urgency.  Musculoskeletal:  Negative for back pain, joint pain, myalgias and neck pain.  Skin:  Negative for itching and rash.  Neurological:  Negative for dizziness, tremors and weakness.  Endo/Heme/Allergies:  Does not bruise/bleed easily.  Psychiatric/Behavioral:  Negative for depression. The patient is not nervous/anxious and does not have insomnia.    Objective: BP 120/80   Ht 5\' 3"  (1.6 m)   Wt 178 lb (  80.7 kg)   LMP 07/03/2021   BMI 31.53 kg/m   Filed Weights   07/31/21 1517  Weight: 178 lb (80.7 kg)   Body mass index is 31.53 kg/m. Physical Exam Constitutional:      General: She is not in acute distress.    Appearance: She is well-developed.  Genitourinary:     Bladder, rectum and urethral meatus normal.     No lesions in the vagina.     Right Labia: No rash, tenderness or lesions.    Left Labia: No tenderness, lesions or rash.    No vaginal bleeding.      Right Adnexa: not tender and no mass present.    Left Adnexa: not tender and no mass present.    No  cervical motion tenderness, friability, lesion or polyp.     Uterus is not enlarged.     No uterine mass detected.    Pelvic exam was performed with patient in the lithotomy position.  Breasts:    Right: No mass, skin change or tenderness.     Left: No mass, skin change or tenderness.  HENT:     Head: Normocephalic and atraumatic. No laceration.     Right Ear: Hearing normal.     Left Ear: Hearing normal.     Mouth/Throat:     Pharynx: Uvula midline.  Eyes:     Pupils: Pupils are equal, round, and reactive to light.  Neck:     Thyroid: No thyromegaly.  Cardiovascular:     Rate and Rhythm: Normal rate and regular rhythm.     Heart sounds: No murmur heard.   No friction rub. No gallop.  Pulmonary:     Effort: Pulmonary effort is normal. No respiratory distress.     Breath sounds: Normal breath sounds. No wheezing.  Abdominal:     General: Bowel sounds are normal. There is no distension.     Palpations: Abdomen is soft.     Tenderness: There is no abdominal tenderness. There is no rebound.  Musculoskeletal:        General: Normal range of motion.     Cervical back: Normal range of motion and neck supple.  Neurological:     Mental Status: She is alert and oriented to person, place, and time.     Cranial Nerves: No cranial nerve deficit.  Skin:    General: Skin is warm and dry.  Psychiatric:        Judgment: Judgment normal.  Vitals reviewed.    Assessment:  ANNUAL EXAM 1. Women's annual routine gynecological examination   2. Dysplasia of cervix, high grade CIN 2    Screening Plan:            1.  Cervical Screening-  Pap smear done today  2.  Labs managed by PCP  3. Counseling for contraception: no method  [facility] - 07/31/21 1519       Pregnancy Intention Screening   Does the patient want to become pregnant in the next year? Yes    Does the patient's partner want to become pregnant in the next year? Yes    Would the patient like to discuss contraceptive options  today? No      Contraception Wrap Up   Current Method No Method - Other Reason    End Method No Method - Other Reason    Contraception Counseling Provided No            The pregnancy intention screening data noted above  was reviewed. Potential methods of contraception were discussed. The patient elected to proceed with Pregnant/Seeking Pregnancy.   H/o Endometriosis    Consider [place], HSG (as well as labs) if finds it difficult to conceive, or if pain intensifies on the right    F/U  Return in about 1 year (around 07/31/2022) for Annual.  [name], MD, [facility], [facility] 07/31/2021  3:36 PM

## 2021-08-02 LAB — CYTOLOGY - PAP
Adequacy: ABSENT
Diagnosis: NEGATIVE

## 2021-08-15 ENCOUNTER — Ambulatory Visit (INDEPENDENT_AMBULATORY_CARE_PROVIDER_SITE_OTHER): Payer: BC Managed Care – PPO | Admitting: Family Medicine

## 2021-08-15 ENCOUNTER — Other Ambulatory Visit: Payer: Self-pay

## 2021-08-15 VITALS — BP 113/68 | HR 75 | Temp 98.5°F | Wt 177.0 lb

## 2021-08-15 DIAGNOSIS — J069 Acute upper respiratory infection, unspecified: Secondary | ICD-10-CM | POA: Diagnosis not present

## 2021-08-15 NOTE — Patient Instructions (Signed)
USE VITAMIN C, VITAMIN D, AND ZINC UNTIL WELL.

## 2021-08-15 NOTE — Progress Notes (Signed)
Acute Office Visit  Subjective:    Patient ID: Kristen Galloway, female    DOB: 1996-03-12, 25 y.o.   MRN: [id number]  No chief complaint on file.   HPI Patient is in today for sore throat.  Patient woke up with sore throat 1 day ago.  She states that it got worse over night.  She has taken a home Covid test and it was negative.  There is no one else in the home that has been sick.  She did have a co-worker that was sick last week but she tested negative for Covid as well. It is of note that the patient has been off of her birth control for 5 months and is actively trying to get pregnant. Patient has had 3 total Covid vaccines.  She is due to get her Flu vaccine in 2 days if she is well.  Past Medical History:  Diagnosis Date   Anemia    Bacterial vaginosis    Chronic pelvic pain in female 06/09/2015   Dyspareunia in female    Headache    Ovarian cyst     Past Surgical History:  Procedure Laterality Date   LAPAROSCOPY N/A 06/09/2015   Procedure: LAPAROSCOPY DIAGNOSTIC;  Surgeon: Conard Novak, MD;  Location: [facility] ORS;  Service: Gynecology;  Laterality: N/A;   WISDOM TOOTH EXTRACTION      Family History  Problem Relation Age of Onset   Endometriosis Mother    Colon cancer Paternal Grandmother    Endometriosis Maternal Grandmother    Melanoma Maternal Grandmother    Diabetes Maternal Grandfather    Diabetes Paternal Grandfather    Heart disease Paternal Grandfather     Social History   Socioeconomic History   Marital status: Married    Spouse name: Not on file   Number of children: Not on file   Years of education: Not on file   Highest education level: Not on file  Occupational History   Not on file  Tobacco Use   Smoking status: Never   Smokeless tobacco: Never  Vaping Use   Vaping Use: Never used  Substance and Sexual Activity   Alcohol use: Yes    Comment: occasional   Drug use: No   Sexual activity: Yes    Birth control/protection: Pill   Other Topics Concern   Not on file  Social History Narrative   Not on file   Social Determinants of Health   Financial Resource Strain: Not on file  Food Insecurity: Not on file  Transportation Needs: Not on file  Physical Activity: Not on file  Stress: Not on file  Social Connections: Not on file  Intimate Partner Violence: Not on file    Outpatient Medications Prior to Visit  Medication Sig Dispense Refill   ascorbic acid (VITAMIN C) 1000 MG tablet Take 1,000 mg by mouth daily.     calcium-vitamin D (OSCAL WITH D) 500-200 MG-UNIT tablet Take 1 tablet by mouth.     ferrous sulfate 325 (65 FE) MG EC tablet Take 325 mg by mouth daily.     Lactobacillus (PROBIOTIC CHILDRENS) CHEW Chew by mouth.     Multiple Vitamin (MULTI-VITAMINS) TABS Take by mouth.     NORTREL 1/35, 28, tablet Take 1 tablet by mouth daily. 84 tablet 3   Prenatal MV-Min-Fe Fum-FA-DHA (PRENATAL 1 PO) Take by mouth.     TURMERIC PO Take 500 mg by mouth.      vitamin B-12 (CYANOCOBALAMIN) 1000 MCG tablet Take  by mouth.     No facility-administered medications prior to visit.    Allergies  Allergen Reactions   Cefzil [Cefprozil] Anaphylaxis   Prednisone Nausea Only    Review of Systems  Constitutional:  Positive for appetite change and fatigue. Negative for chills, diaphoresis and fever.  HENT:  Positive for sore throat, trouble swallowing and voice change. Negative for congestion, ear discharge, ear pain, facial swelling, hearing loss, postnasal drip, rhinorrhea, sinus pressure, sinus pain, sneezing and tinnitus.   Eyes:  Negative for photophobia, pain, discharge, redness, itching and visual disturbance.  Respiratory:  Positive for cough. Negative for shortness of breath and wheezing.   Cardiovascular:  Negative for chest pain.  Gastrointestinal:  Negative for abdominal pain, diarrhea, nausea and vomiting.  Musculoskeletal:  Negative for arthralgias.  Neurological:  Negative for dizziness, light-headedness  and headaches.      Objective:    Physical Exam Vitals reviewed.  Constitutional:      General: She is not in acute distress.    Appearance: She is well-developed.  HENT:     Head: Normocephalic and atraumatic.     Right Ear: Hearing, tympanic membrane and external ear normal.     Left Ear: Hearing, tympanic membrane and external ear normal.     Nose: Nose normal.     Mouth/Throat:     Mouth: Mucous membranes are moist.     Pharynx: Oropharynx is clear. No oropharyngeal exudate or posterior oropharyngeal erythema.  Eyes:     General: Lids are normal. No scleral icterus.       Right eye: No discharge.        Left eye: No discharge.     Conjunctiva/sclera: Conjunctivae normal.  Cardiovascular:     Rate and Rhythm: Normal rate and regular rhythm.     Heart sounds: Normal heart sounds.  Pulmonary:     Effort: Pulmonary effort is normal. No respiratory distress.     Breath sounds: Normal breath sounds.  Lymphadenopathy:     Cervical: No cervical adenopathy.  Skin:    General: Skin is warm and dry.     Findings: No lesion or rash.  Neurological:     General: No focal deficit present.     Mental Status: She is alert and oriented to person, place, and time.  Psychiatric:        Mood and Affect: Mood normal.        Speech: Speech normal.        Behavior: Behavior normal.        Thought Content: Thought content normal.        Judgment: Judgment normal.    BP 113/68 (BP Location: Right Arm, Patient Position: Sitting, Cuff Size: Large)   Pulse 75   Temp 98.5 F (36.9 C) (Oral)   Wt 177 lb (80.3 kg)   SpO2 99%   BMI 31.35 kg/m  Wt Readings from Last 3 Encounters:  08/15/21 177 lb (80.3 kg)  07/31/21 178 lb (80.7 kg)  07/25/20 176 lb (79.8 kg)    Health Maintenance Due  Topic Date Due   HIV Screening  Never done   Hepatitis C Screening  Never done   TETANUS/TDAP  Never done   COVID-19 Vaccine (3 - Booster for Pfizer series) 07/05/2020   INFLUENZA VACCINE  05/29/2021     There are no preventive care reminders to display for this patient.   Lab Results  Component Value Date   TSH 2.030 09/12/2020   Lab Results  Component Value Date   WBC 6.6 09/22/2018   HGB 13.2 09/22/2018   HCT 39.3 09/22/2018   MCV 79 09/22/2018   PLT 354 09/22/2018   Lab Results  Component Value Date   NA 140 09/22/2018   K 4.3 09/22/2018   CO2 21 09/22/2018   GLUCOSE 86 09/22/2018   BUN 7 09/22/2018   CREATININE 0.70 09/22/2018   BILITOT 0.3 09/22/2018   ALKPHOS 60 09/22/2018   AST 14 09/22/2018   ALT 9 09/22/2018   PROT 6.6 09/22/2018   ALBUMIN 4.4 09/22/2018   CALCIUM 9.3 09/22/2018   ANIONGAP 8 05/25/2015   Lab Results  Component Value Date   CHOL 195 09/12/2020   Lab Results  Component Value Date   HDL 54 09/12/2020   Lab Results  Component Value Date   LDLCALC 124 (H) 09/12/2020   Lab Results  Component Value Date   TRIG 96 09/12/2020   Lab Results  Component Value Date   CHOLHDL 3.6 09/12/2020   No results found for: HGBA1C     Assessment & Plan:   Problem List Items Addressed This Visit   None  No orders of the defined types were placed in this encounter.  1. Viral URI Viral URI with pharyngitis.  Patient is tested negative for COVID. Time treat with vitamin C vitamin D and zinc daily until well.  Robitussin for any cough symptoms.  Follow-up as needed.   Adline Peals, CMA

## 2021-10-29 NOTE — L&D Delivery Note (Signed)
After dilating rapidly from 2 cms to 7 cms, AROM of a palpable forebag waas performed, and Kristen Galloway dilated quickly to complete. She began pushing at approximately 2310. Delivery Note At  2350,a viable female was delivered vaginally over a second degree perineal laceration. (Presentation: LOA, with restitution to ROT. Nuchal cord x 2 reduced on the perineum     ).  APGAR:9 ,10 ; weight pending .   Placenta status: delivered intact at 2359 ,quite calcified in appearance  .  Cord:  3 vessel with the following complications: none. Some meconium fluid seen after the baby's body was delivered. .    Anesthesia: Epidural  Episiotomy:  none Lacerations:  second degree Suture Repair: 3.0 vicryl rapide Est. Blood Loss (mL):  200 cc  Mother to Postpartum unit, and the baby will be under couplet care. Elevated blood pressures were noted during second stage labor. A PIH panel of labs has been ordered and drawn.  Kristen Galloway 07/01/2022, 12:56 AM

## 2021-11-02 ENCOUNTER — Telehealth: Payer: Self-pay

## 2021-11-02 NOTE — Telephone Encounter (Signed)
Pt called after hour nurse 1;4;23 6:13pm; is 6wks preg; ate over easy eggs then looked online and found she shouldn't eat them; denies nausea, vomiting or diarrhea or abd pain.  [phone]  Pt states she is fine this morning.

## 2021-11-15 ENCOUNTER — Other Ambulatory Visit (HOSPITAL_COMMUNITY)
Admission: RE | Admit: 2021-11-15 | Discharge: 2021-11-15 | Disposition: A | Discharge status: Home / Self Care | Payer: BC Managed Care – PPO | Source: Ambulatory Visit | Attending: Obstetrics | Admitting: Obstetrics

## 2021-11-15 ENCOUNTER — Other Ambulatory Visit: Payer: Self-pay

## 2021-11-15 ENCOUNTER — Ambulatory Visit (INDEPENDENT_AMBULATORY_CARE_PROVIDER_SITE_OTHER): Payer: BC Managed Care – PPO | Admitting: Obstetrics

## 2021-11-15 ENCOUNTER — Encounter: Payer: Self-pay | Admitting: Obstetrics

## 2021-11-15 VITALS — BP 122/78 | Wt 182.0 lb

## 2021-11-15 DIAGNOSIS — Z3A08 8 weeks gestation of pregnancy: Secondary | ICD-10-CM

## 2021-11-15 DIAGNOSIS — Z113 Encounter for screening for infections with a predominantly sexual mode of transmission: Secondary | ICD-10-CM | POA: Diagnosis not present

## 2021-11-15 DIAGNOSIS — Z3480 Encounter for supervision of other normal pregnancy, unspecified trimester: Secondary | ICD-10-CM | POA: Diagnosis not present

## 2021-11-15 DIAGNOSIS — Z3490 Encounter for supervision of normal pregnancy, unspecified, unspecified trimester: Secondary | ICD-10-CM

## 2021-11-15 HISTORY — DX: Encounter for supervision of normal pregnancy, unspecified, unspecified trimester: Z34.90

## 2021-11-15 NOTE — Progress Notes (Signed)
NOB today. LMP 09/19/2021

## 2021-11-15 NOTE — Progress Notes (Signed)
New Obstetric Patient H&P    Chief Complaint: "Desires prenatal care"   History of Present Illness: Patient is a 26 y.o. G1P0000 Not Hispanic or [place] female, LMP 09/19/2021 presents with amenorrhea and positive home pregnancy test. Based on her  LMP, her EDD is Estimated Date of Delivery: 06/26/22 and her EGA is [redacted]w[redacted]d. Cycles are 6. days, regular, and occur approximately every : 28 days. Her last pap smear was about 2 months ago and was no abnormalities.    She had a urine pregnancy test which was positive about 2 week(s)  ago. Her last menstrual period was normal and lasted for  about 6 day(s). Since her LMP she claims she has experienced nausea and breast tenderness. She denies vaginal bleeding. Her past medical history is noncontributory. Her prior pregnancies are notable for none  Since her LMP, she admits to the use of tobacco products  no She claims she has gained   no pounds since the start of her pregnancy.  There are cats in the home in the home  no She admits close contact with children on a regular basis  no  She has had chicken pox in the past yes She has had Tuberculosis exposures, symptoms, or previously tested positive for TB   no Current or past history of domestic violence. no  Genetic Screening/Teratology Counseling: (Includes patient, baby's father, or anyone in either family with:)   32. Patient's age >/= 36 at [facility]  no 2. Thalassemia ([place], [place], [place], or Asian background): MCV<80  no 3. Neural tube defect (meningomyelocele, spina bifida, anencephaly)  no 4. Congenital heart defect  no  5. Down syndrome  no 6. Tay-Sachs (Jewish, [place])  no 7. Canavan's Disease  no 8. Sickle cell disease or trait (African)  no  9. Hemophilia or other blood disorders  no  10. Muscular dystrophy  no  11. Cystic fibrosis  no  12. Huntington's Chorea  no  13. Mental retardation/autism  no 14. Other inherited genetic or chromosomal disorder  no 15.  Maternal metabolic disorder (DM, PKU, etc)  no 16. Patient or FOB with a child with a birth defect not listed above no  16a. Patient or FOB with a birth defect themselves no 17. Recurrent pregnancy loss, or stillbirth  no  18. Any medications since LMP other than prenatal vitamins (include vitamins, supplements, OTC meds, drugs, alcohol)  no 19. Any other genetic/environmental exposure to discuss  no  Infection History:   1. Lives with someone with TB or TB exposed  no  2. Patient or partner has history of genital herpes  no 3. Rash or viral illness since LMP  no 4. History of STI (GC, CT, HPV, syphilis, HIV)  no 5. History of recent travel :  no  Other pertinent information:  Yes- she has   A hx of endometriosis     Review of Systems:10 point review of systems negative unless otherwise noted in HPI  Past Medical History:  Past Medical History:  Diagnosis Date   Anemia    Bacterial vaginosis    Chronic pelvic pain in female 06/09/2015   Dyspareunia in female    Headache    Ovarian cyst     Past Surgical History:  Past Surgical History:  Procedure Laterality Date   LAPAROSCOPY N/A 06/09/2015   Procedure: LAPAROSCOPY DIAGNOSTIC;  Surgeon: Will Bonnet, MD;  Location: [facility] ORS;  Service: Gynecology;  Laterality: N/A;   WISDOM TOOTH EXTRACTION  Gynecologic History: Patient's last menstrual period was 09/19/2021 (exact date).  Obstetric History: G1P0000  Family History:  Family History  Problem Relation Age of Onset   Endometriosis Mother    Colon cancer Paternal Grandmother    Endometriosis Maternal Grandmother    Melanoma Maternal Grandmother    Diabetes Maternal Grandfather    Diabetes Paternal Grandfather    Heart disease Paternal Grandfather     Social History:  Social History   Socioeconomic History   Marital status: Married    Spouse name: Not on file   Number of children: Not on file   Years of education: Not on file    Highest education level: Not on file  Occupational History   Not on file  Tobacco Use   Smoking status: Never   Smokeless tobacco: Never  Vaping Use   Vaping Use: Never used  Substance and Sexual Activity   Alcohol use: Yes    Comment: occasional   Drug use: No   Sexual activity: Yes  Other Topics Concern   Not on file  Social History Narrative   Not on file   Social Determinants of Health   Financial Resource Strain: Not on file  Food Insecurity: Not on file  Transportation Needs: Not on file  Physical Activity: Not on file  Stress: Not on file  Social Connections: Not on file  Intimate Partner Violence: Not on file    Allergies:  Allergies  Allergen Reactions   Cefzil [Cefprozil] Anaphylaxis   Prednisone Nausea Only    Medications: Prior to Admission medications   Medication Sig Start Date End Date Taking? Authorizing Provider  Prenatal MV-Min-Fe Fum-FA-DHA (PRENATAL 1 PO) Take by mouth.   Yes [provider]    Physical Exam Vitals: Blood pressure 122/78, weight 182 lb (82.6 kg), last menstrual period 09/19/2021.  General: NAD HEENT: normocephalic, anicteric Thyroid: no enlargement, no palpable nodules Pulmonary: No increased work of breathing, CTAB Cardiovascular: RRR, distal pulses 2+ Abdomen: NABS, soft, non-tender, non-distended.  Umbilicus without lesions.  No hepatomegaly, splenomegaly or masses palpable. No evidence of hernia  Genitourinary:  External: Normal external female genitalia.  Normal urethral meatus, normal  Bartholin's and Skene's glands.    Vagina: Normal vaginal mucosa, no evidence of prolapse.    Cervix: Grossly normal in appearance, no bleeding  Uterus: anteverted, Non-enlarged, mobile, normal contour.  No CMT  Adnexa: ovaries non-enlarged, no adnexal masses  Rectal: deferred Extremities: no edema, erythema, or tenderness Neurologic: Grossly intact Psychiatric: mood appropriate, affect full   Assessment: 26  y.o. G1P0000 at [redacted]w[redacted]d presenting to initiate prenatal care  Plan: 1) Avoid alcoholic beverages. 2) Patient encouraged not to smoke.  3) Discontinue the use of all non-medicinal drugs and chemicals.  4) Take prenatal vitamins daily.  5) Nutrition, food safety (fish, cheese advisories, and high nitrite foods) and exercise discussed. 6) Hospital and practice style discussed with cross coverage system.  7) Genetic Screening, such as with 1st Trimester Screening, cell free fetal DNA, AFP testing, and Ultrasound, as well as with amniocentesis and CVS as appropriate, is discussed with patient. At the conclusion of today's visit patient undecided genetic testing 8) Patient is asked about travel to areas at risk for the [place] virus, and counseled to avoid travel and exposure to mosquitoes or sexual partners who may have themselves been exposed to the virus. Testing is discussed, and will be ordered as appropriate.  The following were addressed during this visit:  Breastfeeding Education - Early initiation of breastfeeding  Comments: Keeps milk supply adequate, helps contract uterus and slow bleeding, and early milk is the perfect first food and is easy to digest.   - The importance of exclusive breastfeeding    Comments: Provides antibodies, Lower risk of breast and ovarian cancers, and type-2 diabetes,Helps your body recover, Reduced chance of SIDS.   - Risks of giving your baby anything other than breast milk if you are breastfeeding    Comments: Make the baby less content with breastfeeds, may make my baby more susceptible to illness, and may reduce my milk supply.   - Nonpharmacological pain relief methods for labor    Comments: Deep breathing, focusing on pleasant things, movement and walking, heating pads or cold compress, massage and relaxation, continuous support from someone you trust, and Doulas   - The importance of early skin-to-skin contact    Comments: Keeps baby warm and secure,  helps keep babys blood sugar up and breathing steady, easier to bond and breastfeed, and helps calm baby.  - Rooming-in on a 24-hour basis    Comments: Easier to learn baby's feeding cues, easier to bond and get to know each other, and encourages milk production.   - Feeding on demand or baby-led feeding    Comments: Helps prevent breastfeeding complications, helps bring in good milk supply, prevents under or overfeeding, and helps baby feel content and satisfied   - Frequent feeding to help assure optimal milk production    Comments: Making a full supply of milk requires frequent removal of milk from breasts, infant will eat 8-12 times in 24 hours, if separated from infant use breast massage, hand expression and/ or pumping to remove milk from breasts.   - Effective positioning and attachment    Comments: Helps my baby to get enough breast milk, helps to produce an adequate milk supply, and helps prevent nipple pain and damage   - Exclusive breastfeeding for the first 6 months    Comments: Builds a healthy milk supply and keeps it up, protects baby from sickness and disease, and breastmilk has everything your baby needs for the first 6 months.  - Individualized Education    Comments: Contraindications to breastfeeding and other special medical conditions   RTC in 3 weeks for dating scan and blood work. Imagene Riches, CNM  11/15/2021 4:16 PM

## 2021-11-17 LAB — CERVICOVAGINAL ANCILLARY ONLY
Bacterial Vaginitis (gardnerella): NEGATIVE
Candida Glabrata: NEGATIVE
Candida Vaginitis: POSITIVE — AB
Chlamydia: NEGATIVE
Comment: NEGATIVE
Comment: NEGATIVE
Comment: NEGATIVE
Comment: NEGATIVE
Comment: NEGATIVE
Comment: NORMAL
Neisseria Gonorrhea: NEGATIVE
Trichomonas: NEGATIVE

## 2021-11-17 LAB — URINE CULTURE

## 2021-11-21 ENCOUNTER — Other Ambulatory Visit: Payer: Self-pay

## 2021-11-21 ENCOUNTER — Emergency Department: Payer: BC Managed Care – PPO

## 2021-11-21 ENCOUNTER — Encounter: Payer: Self-pay | Admitting: Emergency Medicine

## 2021-11-21 DIAGNOSIS — O4691 Antepartum hemorrhage, unspecified, first trimester: Secondary | ICD-10-CM | POA: Diagnosis not present

## 2021-11-21 DIAGNOSIS — O26851 Spotting complicating pregnancy, first trimester: Secondary | ICD-10-CM | POA: Insufficient documentation

## 2021-11-21 DIAGNOSIS — O209 Hemorrhage in early pregnancy, unspecified: Secondary | ICD-10-CM | POA: Diagnosis not present

## 2021-11-21 DIAGNOSIS — Z3A08 8 weeks gestation of pregnancy: Secondary | ICD-10-CM | POA: Diagnosis not present

## 2021-11-21 DIAGNOSIS — O26891 Other specified pregnancy related conditions, first trimester: Secondary | ICD-10-CM | POA: Diagnosis not present

## 2021-11-21 DIAGNOSIS — Z3A09 9 weeks gestation of pregnancy: Secondary | ICD-10-CM | POA: Insufficient documentation

## 2021-11-21 DIAGNOSIS — N83201 Unspecified ovarian cyst, right side: Secondary | ICD-10-CM | POA: Diagnosis not present

## 2021-11-21 DIAGNOSIS — O3481 Maternal care for other abnormalities of pelvic organs, first trimester: Secondary | ICD-10-CM | POA: Diagnosis not present

## 2021-11-21 DIAGNOSIS — R1031 Right lower quadrant pain: Secondary | ICD-10-CM | POA: Diagnosis not present

## 2021-11-21 DIAGNOSIS — Z3A01 Less than 8 weeks gestation of pregnancy: Secondary | ICD-10-CM | POA: Diagnosis not present

## 2021-11-21 LAB — CBC
HCT: 38 % (ref 36.0–46.0)
Hemoglobin: 12.9 g/dL (ref 12.0–15.0)
MCH: 26.3 pg (ref 26.0–34.0)
MCHC: 33.9 g/dL (ref 30.0–36.0)
MCV: 77.6 fL — ABNORMAL LOW (ref 80.0–100.0)
Platelets: 340 10*3/uL (ref 150–400)
RBC: 4.9 MIL/uL (ref 3.87–5.11)
RDW: 14 % (ref 11.5–15.5)
WBC: 10.1 10*3/uL (ref 4.0–10.5)
nRBC: 0 % (ref 0.0–0.2)

## 2021-11-21 LAB — COMPREHENSIVE METABOLIC PANEL
ALT: 17 U/L (ref 0–44)
AST: 17 U/L (ref 15–41)
Albumin: 3.9 g/dL (ref 3.5–5.0)
Alkaline Phosphatase: 56 U/L (ref 38–126)
Anion gap: 7 (ref 5–15)
BUN: 9 mg/dL (ref 6–20)
CO2: 23 mmol/L (ref 22–32)
Calcium: 9.2 mg/dL (ref 8.9–10.3)
Chloride: 103 mmol/L (ref 98–111)
Creatinine, Ser: 0.45 mg/dL (ref 0.44–1.00)
GFR, Estimated: >60 mL/min (ref >60)
Glucose, Bld: 98 mg/dL (ref 70–99)
Potassium: 3.7 mmol/L (ref 3.5–5.1)
Sodium: 133 mmol/L — ABNORMAL LOW (ref 135–145)
Total Bilirubin: 0.3 mg/dL (ref 0.3–1.2)
Total Protein: 7.3 g/dL (ref 6.5–8.1)

## 2021-11-21 LAB — URINALYSIS, ROUTINE W REFLEX MICROSCOPIC
Bilirubin Urine: NEGATIVE
Glucose, UA: NEGATIVE mg/dL
Hgb urine dipstick: NEGATIVE
Ketones, ur: NEGATIVE mg/dL
Leukocytes,Ua: NEGATIVE
Nitrite: NEGATIVE
Protein, ur: NEGATIVE mg/dL
Specific Gravity, Urine: 1.02 (ref 1.005–1.030)
pH: 7 (ref 5.0–8.0)

## 2021-11-21 LAB — LIPASE, BLOOD: Lipase: 31 U/L (ref 11–51)

## 2021-11-21 LAB — ABO/RH: ABO/RH(D): O POS

## 2021-11-21 LAB — POC URINE PREG, ED: Preg Test, Ur: POSITIVE — AB

## 2021-11-21 NOTE — ED Triage Notes (Signed)
Pt to ED from home c/o lower right abd pressure and sharp pain that started approx 1hr PTA with light spotting light pink discharge, denies clots.  States approx [redacted] weeks pregnant, first pregnancy, hx of endometriosis.  Followed by Consuella Lose.  Pt A&Ox4, chest rise even and unlabored, skin WNL and in NAD at this time.

## 2021-11-22 ENCOUNTER — Emergency Department
Admission: EM | Admit: 2021-11-22 | Discharge: 2021-11-22 | Disposition: A | Discharge status: Home / Self Care | Payer: BC Managed Care – PPO | Attending: Emergency Medicine | Admitting: Emergency Medicine

## 2021-11-22 DIAGNOSIS — O209 Hemorrhage in early pregnancy, unspecified: Secondary | ICD-10-CM

## 2021-11-22 DIAGNOSIS — R1031 Right lower quadrant pain: Secondary | ICD-10-CM

## 2021-11-22 DIAGNOSIS — N939 Abnormal uterine and vaginal bleeding, unspecified: Secondary | ICD-10-CM

## 2021-11-22 LAB — HCG, QUANTITATIVE, PREGNANCY: hCG, Beta Chain, Quant, S: 371650 m[IU]/mL — ABNORMAL HIGH (ref <5)

## 2021-11-22 NOTE — ED Provider Notes (Signed)
[facility] Provider Note    Event Date/Time   First MD Initiated Contact with Patient 11/22/21 0015     (approximate)   History   Abdominal Pain   HPI  Kristen Galloway is a 26 y.o. female G1, P0 who presents at approximately 9 weeks pregnancy with vaginal bleeding and abdominal pain.  Patient has history of endometriosis and notes that she frequently has lower abdominal pain that is worse on the right side.  Intermittently it will be sharp.  Over the last several days pain has been intermittently worse and yesterday she had a very sharp pain had difficulty sleeping.  Then today she had some mild spotting.  She denies fevers or chills.  Has had morning sickness.  Denies constipation diarrhea.  Pain is improved at this time.  She is not taking anything for pain.  Has not yet had her first OB/GYN appointment, will follow-up with [facility].    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   Low grade squamous intraepith lesion on cytologic smear cervix (lgsil) 11/15/2017   Dysmenorrhea 09/25/2017   Endometriosis 06/15/2015   Migraines 06/15/2015   Anemia 06/15/2015   Avitaminosis D 06/15/2015   Enlarged uterus 06/15/2015   Ruptured ovarian cyst 06/15/2015   Family history of colon cancer requiring screening colonoscopy 06/15/2015   Menorrhagia with regular cycle 06/15/2015   Chronic pelvic pain in female 06/09/2015     Physical Exam  Triage Vital Signs: ED Triage Vitals  Enc Vitals Group     BP 11/21/21 2204 (!) 146/90     Pulse Rate 11/21/21 2204 85     Resp 11/21/21 2204 16     Temp 11/21/21 2204 98.7 F (37.1 C)     Temp Source 11/21/21 2204  Oral     SpO2 11/21/21 2204 100 %     Weight 11/21/21 2205 178 lb (80.7 kg)     Height 11/21/21 2205 5\' 3"  (1.6 m)     Head Circumference --      Peak Flow --      Pain Score 11/21/21 2205 2     Pain Loc --      Pain Edu? --      Excl. in GC? --     Most recent vital signs: Vitals:   11/21/21 2204  BP: (!) 146/90  Pulse: 85  Resp: 16  Temp: 98.7 F (37.1 C)  SpO2: 100%     General: Awake, no distress.  CV:  Good peripheral perfusion.  Resp:  Normal effort.  Abd:  No distention.  Soft and nontender throughout including in the right lower quadrant Neuro:             Awake, Alert, Oriented x 3  Other:     ED Results / Procedures / Treatments  Labs (all labs ordered are listed, but only abnormal results are displayed) Labs Reviewed  COMPREHENSIVE METABOLIC PANEL - Abnormal; Notable for the following components:      Result Value   Sodium 133 (*)    All other components within normal limits  CBC - Abnormal; Notable for  the following components:   MCV 77.6 (*)    All other components within normal limits  URINALYSIS, ROUTINE W REFLEX MICROSCOPIC - Abnormal; Notable for the following components:   APPearance HAZY (*)    All other components within normal limits  POC URINE PREG, ED - Abnormal; Notable for the following components:   Preg Test, Ur POSITIVE (*)    All other components within normal limits  LIPASE, BLOOD  HCG, QUANTITATIVE, PREGNANCY  ABO/RH     EKG     RADIOLOGY Reviewed the first trimester ultrasound which shows a IUP approximately 8 weeks and 4 days   PROCEDURES:  Critical Care performed: No   MEDICATIONS ORDERED IN ED: Medications - No data to display   IMPRESSION / MDM / ASSESSMENT AND PLAN / ED COURSE  I reviewed the triage vital signs and the nursing notes.                              Differential diagnosis includes, but is not limited to, ectopic pregnancy, threatened miscarriage, UTI, appendicitis  The patient is a  26 year old female G1 P0 approximately [redacted] weeks pregnant presents with right lower quadrant pain and vaginal spotting.  Patient has a history of endometriosis and has pain in the right lower quadrant is not new for her.  She has had this many times before.  Was worse yesterday and today had some mild spotting.  No significant bleeding.  Vital signs are within normal limits.  On exam patient appears well does not appear uncomfortable.  Her abdominal exam is reassuring she is really not tender in the right lower quadrant.  Her blood work is also reassuring there is no leukocytosis, hemoglobin is normal.  UA has no evidence of infection.  Blood type is O+, no indication for RhoGAM.  I reviewed the patient's ultrasound which shows an IUP approximately 8 weeks and 4 days and she does have a 3.7 cm cyst on the right ovary.  I am reassured by the fact that patient has had this pain related to her endometriosis in the past this is likely in the setting of pregnancy.  My suspicion for appendicitis is low again given the chronicity of the pain and her reassuring abdominal exam without other associated symptoms.  She will follow-up with [facility].  We discussed return precautions for worsening pain or increased bleeding.   FINAL CLINICAL IMPRESSION(S) / ED DIAGNOSES   Final diagnoses:  Vaginal bleeding in pregnancy, first trimester     Rx / DC Orders   ED Discharge Orders     None        Note:  This document was prepared using Dragon voice recognition software and may include unintentional dictation errors.   Georga Hacking, MD 11/22/21 [phone]

## 2021-11-22 NOTE — ED Notes (Signed)
E-signature pad unavailable - Pt verbalized understanding of D/C information - no additional concerns at this time.  

## 2021-11-30 ENCOUNTER — Other Ambulatory Visit: Payer: Self-pay

## 2021-11-30 ENCOUNTER — Ambulatory Visit (INDEPENDENT_AMBULATORY_CARE_PROVIDER_SITE_OTHER): Payer: BC Managed Care – PPO

## 2021-11-30 DIAGNOSIS — Z3480 Encounter for supervision of other normal pregnancy, unspecified trimester: Secondary | ICD-10-CM | POA: Diagnosis not present

## 2021-12-07 ENCOUNTER — Other Ambulatory Visit: Payer: BC Managed Care – PPO

## 2021-12-14 ENCOUNTER — Ambulatory Visit (INDEPENDENT_AMBULATORY_CARE_PROVIDER_SITE_OTHER): Payer: BC Managed Care – PPO | Admitting: Advanced Practice Midwife

## 2021-12-14 ENCOUNTER — Encounter: Payer: Self-pay | Admitting: Advanced Practice Midwife

## 2021-12-14 ENCOUNTER — Other Ambulatory Visit: Payer: Self-pay

## 2021-12-14 VITALS — BP 120/80 | Wt 180.0 lb

## 2021-12-14 DIAGNOSIS — Z1159 Encounter for screening for other viral diseases: Secondary | ICD-10-CM

## 2021-12-14 DIAGNOSIS — Z369 Encounter for antenatal screening, unspecified: Secondary | ICD-10-CM

## 2021-12-14 DIAGNOSIS — Z1379 Encounter for other screening for genetic and chromosomal anomalies: Secondary | ICD-10-CM | POA: Diagnosis not present

## 2021-12-14 DIAGNOSIS — Z3A12 12 weeks gestation of pregnancy: Secondary | ICD-10-CM

## 2021-12-14 DIAGNOSIS — Z3401 Encounter for supervision of normal first pregnancy, first trimester: Secondary | ICD-10-CM | POA: Diagnosis not present

## 2021-12-14 DIAGNOSIS — Z113 Encounter for screening for infections with a predominantly sexual mode of transmission: Secondary | ICD-10-CM

## 2021-12-14 NOTE — Progress Notes (Signed)
Routine Prenatal Care Visit  Subjective  Kristen Galloway is a 26 y.o. G1P0000 at [redacted]w[redacted]d being seen today for ongoing prenatal care.  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  ----------------------------------------------------------------------------------- Patient reports nausea symptoms resolved at 10 weeks.    . Vag. Bleeding: None.   . Leaking Fluid denies.  ----------------------------------------------------------------------------------- The following portions of the patient's history were reviewed and updated as appropriate: allergies, current medications, past family history, past medical history, past social history, past surgical history and problem list. Problem list updated.  Objective  Blood pressure 120/80, weight 180 lb (81.6 kg), last menstrual period 09/19/2021. Pregravid weight 182 lb (82.6 kg) Total Weight Gain -2 lb (-0.907 kg) Urinalysis: Urine Protein    Urine Glucose    Fetal Status: Fetal Heart Rate (bpm): 172         General:  Alert, oriented and cooperative. Patient is in no acute distress.  Skin: Skin is warm and dry. No rash noted.   Cardiovascular: Normal heart rate noted  Respiratory: Normal respiratory effort, no problems with respiration noted  Abdomen: Soft, gravid, appropriate for gestational age. Pain/Pressure: Present     Pelvic:  Cervical exam deferred        Extremities: Normal range of motion.  Edema: None  Mental Status: Normal mood and affect. Normal behavior. Normal judgment and thought content.    Assessment   26 y.o. G1P0000 at [redacted]w[redacted]d by  06/26/2022, by Last Menstrual Period presenting for routine prenatal visit  Plan   pregnancy Problems (from 11/15/21 to present)    Problem Noted Resolved   Supervision of normal pregnancy 11/15/2021 by [name], CNM No   Overview Addendum 12/03/2021 10:31 AM by [name], CNM     Nursing Staff Provider  Office Location  [facility] Dating    Language  English Anatomy US    Flu Vaccine   Genetic Screen  NIPS:   TDaP vaccine    Hgb A1C or  GTT Early : Third trimester :   Covid    LAB RESULTS   Rhogam   Blood Type     Feeding Plan  Antibody    Contraception  Rubella    Circumcision  RPR     Pediatrician   HBsAg     Support Person  HIV    Prenatal Classes  Varicella     GBS  (For PCN allergy, check sensitivities)   BTL Consent     VBAC Consent  Pap      Hgb Electro      CF      SMA                   Preterm labor symptoms and general obstetric precautions including but not limited to vaginal bleeding, contractions, leaking of fluid and fetal movement were reviewed in detail with the patient. Please refer to After Visit Summary for other counseling recommendations.   Return in about 4 weeks (around 01/11/2022) for rob.  Tresea Mall, CNM 12/14/2021 2:17 PM

## 2021-12-14 NOTE — Patient Instructions (Signed)

## 2021-12-15 LAB — CBC/D/PLT+RPR+RH+ABO+RUBIGG...
Antibody Screen: NEGATIVE
Basophils Absolute: 0 10*3/uL (ref 0.0–0.2)
Basos: 0 %
EOS (ABSOLUTE): 0 10*3/uL (ref 0.0–0.4)
Eos: 0 %
HCV Ab: NONREACTIVE
HIV Screen 4th Generation wRfx: NONREACTIVE
Hematocrit: 37 % (ref 34.0–46.6)
Hemoglobin: 12.3 g/dL (ref 11.1–15.9)
Hepatitis B Surface Ag: NEGATIVE
Immature Grans (Abs): 0 10*3/uL (ref 0.0–0.1)
Immature Granulocytes: 0 %
Lymphocytes Absolute: 2.1 10*3/uL (ref 0.7–3.1)
Lymphs: 22 %
MCH: 26.2 pg — ABNORMAL LOW (ref 26.6–33.0)
MCHC: 33.2 g/dL (ref 31.5–35.7)
MCV: 79 fL (ref 79–97)
Monocytes Absolute: 0.8 10*3/uL (ref 0.1–0.9)
Monocytes: 9 %
Neutrophils Absolute: 6.6 10*3/uL (ref 1.4–7.0)
Neutrophils: 69 %
Platelets: 307 10*3/uL (ref 150–450)
RBC: 4.7 x10E6/uL (ref 3.77–5.28)
RDW: 14.5 % (ref 11.7–15.4)
RPR Ser Ql: NONREACTIVE
Rh Factor: POSITIVE
Rubella Antibodies, IGG: <0.9 index — ABNORMAL LOW (ref >0.99)
Varicella zoster IgG: <135 index — ABNORMAL LOW (ref >165)
WBC: 9.7 10*3/uL (ref 3.4–10.8)

## 2021-12-15 LAB — HCV INTERPRETATION

## 2021-12-18 LAB — MATERNIT21 PLUS CORE+SCA
Fetal Fraction: 9
Monosomy X (Turner Syndrome): NOT DETECTED
Result (T21): NEGATIVE
Trisomy 13 (Patau syndrome): NEGATIVE
Trisomy 18 (Edwards syndrome): NEGATIVE
Trisomy 21 (Down syndrome): NEGATIVE
XXX (Triple X Syndrome): NOT DETECTED
XXY (Klinefelter Syndrome): NOT DETECTED
XYY (Jacobs Syndrome): NOT DETECTED

## 2022-01-11 ENCOUNTER — Other Ambulatory Visit: Payer: Self-pay

## 2022-01-11 ENCOUNTER — Ambulatory Visit (INDEPENDENT_AMBULATORY_CARE_PROVIDER_SITE_OTHER): Payer: BC Managed Care – PPO | Admitting: Advanced Practice Midwife

## 2022-01-11 ENCOUNTER — Encounter: Payer: Self-pay | Admitting: Advanced Practice Midwife

## 2022-01-11 VITALS — BP 120/80 | Wt 178.0 lb

## 2022-01-11 NOTE — Progress Notes (Signed)
Routine Prenatal Care Visit ? ?Subjective  ?Kristen Galloway is a 26 y.o. G1P0000 at [redacted]w[redacted]d being seen today for ongoing prenatal care.  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  ?----------------------------------------------------------------------------------- ?Patient reports no complaints. Her prenatal and labor questions are answered. Reviewed practice changes with her and her husband. ?Contractions: Not present. Vag. Bleeding: None.  Movement: Present. Leaking Fluid denies.  ?----------------------------------------------------------------------------------- ?The following portions of the patient's history were reviewed and updated as appropriate: allergies, current medications, past family history, past medical history, past social history, past surgical history and problem list. Problem list updated. ? ?Objective  ?Blood pressure 120/80, weight 178 lb (80.7 kg), last menstrual period 09/19/2021. ?Pregravid weight 182 lb (82.6 kg) Total Weight Gain -4 lb (-1.814 kg) ?Urinalysis: Urine Protein    Urine Glucose   ? ?Fetal Status: Fetal Heart Rate (bpm): 153   Movement: Present    ? ?General:  Alert, oriented and cooperative. Patient is in no acute distress.  ?Skin: Skin is warm and dry. No rash noted.   ?Cardiovascular: Normal heart rate noted  ?Respiratory: Normal respiratory effort, no problems with respiration noted  ?Abdomen: Soft, gravid, appropriate for gestational age. Pain/Pressure: Absent     ?Pelvic:  Cervical exam deferred        ?Extremities: Normal  range of motion.  Edema: None  ?Mental Status: Normal mood and affect. Normal behavior. Normal judgment and thought content.  ? ?Assessment  ? ?26 y.o. G1P0000 at [redacted]w[redacted]d by  06/26/2022, by Last Menstrual Period presenting for routine prenatal visit ? ?Plan  ? ?pregnancy Problems (from 11/15/21 to present)   ? Problem Noted Resolved  ? Supervision of normal pregnancy 11/15/2021 by [name], CNM No  ? Overview Addendum 12/15/2021  1:15 PM by [name], CNM  ?   ?Nursing Staff Provider  ?Office Location  [facility] Dating  EDD 8/29, CW L MP  ?Language  English Anatomy US    ?Flu Vaccine   Genetic Screen  NIPS:   ?TDaP vaccine    Hgb A1C or  ?GTT Early : ?Third trimester :   ?Covid    LAB RESULTS   ?Rhogam   Blood Type     ?Feeding Plan  Antibody    ?Contraception  Rubella    ?Circumcision  RPR     ?Pediatrician   HBsAg     ?Support Person  HIV    ?Prenatal Classes  Varicella   ?  GBS  (For PCN allergy, check sensitivities)   ?BTL Consent     ?VBAC Consent  Pap    ?  Hgb Electro    ?  CF   ?   SMA   ?     ? ? ?  ?  ?  ?  ? ?Preterm labor symptoms and general obstetric precautions including but not limited to vaginal bleeding, contractions, leaking of fluid and fetal movement were reviewed in detail with the patient. ? ? ?Return in about 4 weeks (around 02/08/2022) for anatomy scan with rob after. ? ?Tresea Mall, CNM ?01/11/2022 2:15 PM   ? ?

## 2022-02-06 ENCOUNTER — Other Ambulatory Visit: Payer: Self-pay

## 2022-02-06 ENCOUNTER — Ambulatory Visit
Admission: RE | Admit: 2022-02-06 | Discharge: 2022-02-06 | Disposition: A | Discharge status: Home / Self Care | Payer: BC Managed Care – PPO | Source: Ambulatory Visit | Attending: Advanced Practice Midwife | Admitting: Advanced Practice Midwife

## 2022-02-06 DIAGNOSIS — Z3A19 19 weeks gestation of pregnancy: Secondary | ICD-10-CM | POA: Diagnosis not present

## 2022-02-06 DIAGNOSIS — Z369 Encounter for antenatal screening, unspecified: Secondary | ICD-10-CM | POA: Insufficient documentation

## 2022-02-06 DIAGNOSIS — Z3689 Encounter for other specified antenatal screening: Secondary | ICD-10-CM | POA: Diagnosis not present

## 2022-02-06 DIAGNOSIS — Z3402 Encounter for supervision of normal first pregnancy, second trimester: Secondary | ICD-10-CM | POA: Insufficient documentation

## 2022-02-07 ENCOUNTER — Ambulatory Visit (INDEPENDENT_AMBULATORY_CARE_PROVIDER_SITE_OTHER): Payer: BC Managed Care – PPO | Admitting: Obstetrics and Gynecology

## 2022-02-07 ENCOUNTER — Encounter: Payer: Self-pay | Admitting: Obstetrics and Gynecology

## 2022-02-07 VITALS — BP 122/70 | Wt 184.0 lb

## 2022-02-07 DIAGNOSIS — Z3402 Encounter for supervision of normal first pregnancy, second trimester: Secondary | ICD-10-CM

## 2022-02-07 DIAGNOSIS — Z3A20 20 weeks gestation of pregnancy: Secondary | ICD-10-CM

## 2022-02-07 LAB — POCT URINALYSIS DIPSTICK OB
Glucose, UA: NEGATIVE
POC,PROTEIN,UA: NEGATIVE

## 2022-02-07 NOTE — Addendum Note (Signed)
Addended by: Burtis Junes on: 02/07/2022 03:05 PM ? ? Modules accepted: Orders ? ?

## 2022-02-07 NOTE — Progress Notes (Signed)
? ?  PRENATAL VISIT NOTE ? ?Subjective:  ?Kristen Galloway is a 26 y.o. G1P0000 at [redacted]w[redacted]d being seen today for ongoing prenatal care.  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 ? ?Patient reports no complaints.  Contractions: Not present. Vag. Bleeding: None.  Movement: Present. Denies leaking of fluid.  ? ?The following portions of the patient's history were reviewed and updated as appropriate: allergies, current medications, past family history, past medical history, past social history, past surgical history and problem list.  ? ?Objective:  ? ?Vitals:  ? 02/07/22 1431  ?BP: 122/70  ?Weight: 184 lb (83.5 kg)  ? ? ?Fetal Status: Fetal Heart Rate (bpm): 150 Fundal Height: 20 cm Movement: Present    ? ?General:  Alert, oriented and cooperative. Patient is in no acute distress.  ?Skin: Skin is warm and dry. No rash noted.   ?Cardiovascular: Normal heart rate noted  ?Respiratory: Normal respiratory effort, no problems with respiration noted  ?Abdomen: Soft, gravid, appropriate for gestational age.  Pain/Pressure: Absent     ?Pelvic: Cervical exam deferred        ?Extremities: Normal range of motion.     ?Mental Status: Normal mood and affect. Normal behavior. Normal judgment and thought content.  ? ?Assessment and Plan:  ?Pregnancy: G1P0000 at [redacted]w[redacted]d ?1. Encounter for supervision of normal first pregnancy in second trimester ?Patient is doing well without complaints ?AFP today ?Follow up incomplete anatomy ordered ? ?Preterm labor symptoms and general obstetric precautions  including but not limited to vaginal bleeding, contractions, leaking of fluid and fetal movement were reviewed in detail with the patient. ?Please refer to After Visit Summary for other counseling recommendations.  ? ?Return in about 4 weeks (around 03/07/2022) for in person, ROB, Low risk. ? ?No future appointments. ? ?Catalina Antigua, MD ? ?

## 2022-02-09 LAB — AFP, SERUM, OPEN SPINA BIFIDA
AFP MoM: 1.27
AFP Value: 66.2 ng/mL
Gest. Age on Collection Date: 20.1 weeks
Maternal Age At EDD: 27.8 yr
OSBR Risk 1 IN: 5251
Test Results:: NEGATIVE
Weight: 184 [lb_av]

## 2022-02-14 ENCOUNTER — Encounter: Payer: Self-pay | Admitting: Obstetrics and Gynecology

## 2022-02-14 ENCOUNTER — Other Ambulatory Visit: Payer: Self-pay | Admitting: Obstetrics

## 2022-02-14 ENCOUNTER — Observation Stay
Admission: EM | Admit: 2022-02-14 | Discharge: 2022-02-14 | Disposition: A | Discharge status: Home / Self Care | Payer: BC Managed Care – PPO | Attending: Obstetrics and Gynecology | Admitting: Obstetrics and Gynecology

## 2022-02-14 ENCOUNTER — Other Ambulatory Visit: Payer: Self-pay

## 2022-02-14 DIAGNOSIS — R1031 Right lower quadrant pain: Secondary | ICD-10-CM | POA: Insufficient documentation

## 2022-02-14 DIAGNOSIS — Z3A21 21 weeks gestation of pregnancy: Secondary | ICD-10-CM | POA: Diagnosis not present

## 2022-02-14 DIAGNOSIS — R8271 Bacteriuria: Secondary | ICD-10-CM | POA: Diagnosis not present

## 2022-02-14 DIAGNOSIS — Z3402 Encounter for supervision of normal first pregnancy, second trimester: Secondary | ICD-10-CM

## 2022-02-14 DIAGNOSIS — R109 Unspecified abdominal pain: Secondary | ICD-10-CM | POA: Diagnosis not present

## 2022-02-14 DIAGNOSIS — M549 Dorsalgia, unspecified: Secondary | ICD-10-CM

## 2022-02-14 DIAGNOSIS — O26892 Other specified pregnancy related conditions, second trimester: Secondary | ICD-10-CM | POA: Diagnosis not present

## 2022-02-14 DIAGNOSIS — O2392 Unspecified genitourinary tract infection in pregnancy, second trimester: Secondary | ICD-10-CM | POA: Insufficient documentation

## 2022-02-14 DIAGNOSIS — O99891 Other specified diseases and conditions complicating pregnancy: Secondary | ICD-10-CM | POA: Diagnosis present

## 2022-02-14 DIAGNOSIS — M545 Low back pain, unspecified: Secondary | ICD-10-CM | POA: Insufficient documentation

## 2022-02-14 LAB — URINALYSIS, ROUTINE W REFLEX MICROSCOPIC
Bilirubin Urine: NEGATIVE
Glucose, UA: NEGATIVE mg/dL
Hgb urine dipstick: NEGATIVE
Ketones, ur: NEGATIVE mg/dL
Nitrite: NEGATIVE
Protein, ur: NEGATIVE mg/dL
Specific Gravity, Urine: 1.01 (ref 1.005–1.030)
pH: 7 (ref 5.0–8.0)

## 2022-02-14 MED ORDER — NITROFURANTOIN MONOHYD MACRO 100 MG PO CAPS: 100.0000 mg | ORAL_CAPSULE | Freq: Two times a day (BID) | ORAL | 1 refills | Status: DC

## 2022-02-14 MED ORDER — ACETAMINOPHEN 500 MG PO TABS
ORAL_TABLET | ORAL | Status: AC
  Administered 2022-02-14: 1000 mg via ORAL
  Filled 2022-02-14: qty 2

## 2022-02-14 MED ORDER — ACETAMINOPHEN 500 MG PO TABS
1000.0000 mg | ORAL_TABLET | Freq: Four times a day (QID) | ORAL | Status: DC | PRN
Start: 2022-02-14 — End: 2022-02-14

## 2022-02-14 MED ORDER — CYCLOBENZAPRINE HCL 5 MG PO TABS
5.0000 mg | ORAL_TABLET | Freq: Once | ORAL | Status: AC
Start: 2022-02-14 — End: 2022-02-14
  Administered 2022-02-14: 5 mg via ORAL
  Filled 2022-02-14: qty 1

## 2022-02-14 MED ORDER — CYCLOBENZAPRINE HCL 10 MG PO TABS: 5.0000 mg | ORAL_TABLET | Freq: Three times a day (TID) | ORAL | 2 refills | Status: DC | PRN

## 2022-02-14 NOTE — Final Progress Note (Signed)
Final Progress Note ? ?Patient ID: ?Kristen Galloway ?MRN: [id number] ?DOB/AGE: 1996-04-11 26 y.o. ? ?Admit date: 02/14/2022 ?Admitting provider: Mirna MiresMargaret M Chanon Loney, CNM ?Discharge date: 02/14/2022 ? ? ?Admission Diagnoses:abdominal pain in pregnancy ? ?Discharge Diagnoses:  ?Principal Problem: ?  Back pain affecting pregnancy in second trimester ?bacteruria ? ?History of Present Illness: The patient is a 26 y.o. female G1P0000 at 2351w1d who presents with complaints of some lower right sided abdominal pain and some lower back pain that started last evening, and has continued overnight. Her baby is moving well. She denies contractions, denies vaginal bleeding or LOF fluid. ?The pain is dull and constant, with the origin in her right lower quadrant. She denies any constipation or GI symtoms/ She also denies any dysurai or urgency to void. ? ?Past Medical History:  ?Diagnosis Date  ? Anemia   ? Bacterial vaginosis   ? Chronic pelvic pain in female 06/09/2015  ? Dyspareunia in female   ? Headache   ? Ovarian cyst   ? ? ?Past Surgical History:  ?Procedure Laterality Date  ? LAPAROSCOPY N/A 06/09/2015  ? Procedure: LAPAROSCOPY DIAGNOSTIC;  Surgeon: Conard NovakStephen D Jackson, MD;  Location: [facility] ORS;  Service: Gynecology;  Laterality: N/A;  ? WISDOM TOOTH EXTRACTION    ? ? ?No current facility-administered medications on file prior to encounter.  ? ?Current Outpatient Medications on File Prior to Encounter  ?Medication Sig Dispense Refill  ? Prenatal MV-Min-Fe Fum-FA-DHA (PRENATAL 1 PO) Take by mouth.    ? ? ?Allergies  ?Allergen Reactions  ? Cefzil [Cefprozil] Anaphylaxis  ? Prednisone Nausea Only  ? ? ?Social History  ? ?Socioeconomic History  ? Marital status: Married  ?  Spouse name: Not on file  ? Number of children: Not on file  ? Years of education: Not on file  ? Highest education level: Not on file  ?Occupational History  ? Not on file  ?Tobacco Use  ? Smoking status: Never  ? Smokeless tobacco: Never  ?Vaping Use  ?  Vaping Use: Never used  ?Substance and Sexual Activity  ? Alcohol use: Not Currently  ?  Comment: occasional  ? Drug use: No  ? Sexual activity: Yes  ?  Birth control/protection: Pill  ?Other Topics Concern  ? Not on file  ?Social History Narrative  ? Not on file  ? ?Social Determinants of Health  ? ?Financial Resource Strain: Not on file  ?Food Insecurity: Not on file  ?Transportation Needs: Not on file  ?Physical Activity: Not on file  ?Stress: Not on file  ?Social Connections: Not on file  ?Intimate Partner Violence: Not on file  ? ? ?Family History  ?Problem Relation Age of Onset  ? Endometriosis Mother   ? Colon cancer Paternal Grandmother   ? Endometriosis Maternal Grandmother   ? Melanoma Maternal Grandmother   ? Diabetes Maternal Grandfather   ? Diabetes Paternal Grandfather   ? Heart disease Paternal Grandfather   ?  ? ?ROS  ? ?Physical Exam: ?BP 114/63 (BP Location: Right Arm)   Pulse 82   Temp 98.5 ?F (36.9 ?C) (Oral)   Resp 18   Ht 5\' 3"  (1.6 m)   Wt 83 kg   LMP 09/19/2021 (Exact Date)   BMI 32.42 kg/m?   ?OBGyn Exam ? ?Consults: None ? ?Significant Findings/ Diagnostic Studies: labs:  ?Results for orders placed or performed during the hospital encounter of 02/14/22 (from the past 24 hour(s))  ?Urinalysis, Routine w reflex microscopic     Status:  Abnormal  ? Collection Time: 02/14/22  8:41 AM  ?Result Value Ref Range  ? Color, Urine YELLOW (A) YELLOW  ? APPearance HAZY (A) CLEAR  ? Specific Gravity, Urine 1.010 1.005 - 1.030  ? pH 7.0 5.0 - 8.0  ? Glucose, UA NEGATIVE NEGATIVE mg/dL  ? Hgb urine dipstick NEGATIVE NEGATIVE  ? Bilirubin Urine NEGATIVE NEGATIVE  ? Ketones, ur NEGATIVE NEGATIVE mg/dL  ? Protein, ur NEGATIVE NEGATIVE mg/dL  ? Nitrite NEGATIVE NEGATIVE  ? Leukocytes,Ua MODERATE (A) NEGATIVE  ? RBC / HPF 0-5 0 - 5 RBC/hpf  ? WBC, UA 0-5 0 - 5 WBC/hpf  ? Bacteria, UA MANY (A) NONE SEEN  ? Squamous Epithelial / LPF 0-5 0 - 5  ? Mucus PRESENT   ? ? ? ?Procedures: FHT check- FHT 134, then  154 when repeated. reassuring ?NST ? ? ?Hospital Course: The patient was admitted to Labor and Delivery Triage for observation. Fetal heart tones were reassuring. A CCUA indicated moderate Leuks and bacteria. She was provided Tylenol and Flexeril, and this diminished her pain. The urine was sent for culture. With a noticeable improvement in her pain, she is discharged home with Rxs for limited flexeril and to start on Mascrobid BID. She plans to follow up at [facility] if she has a return of her symtpoms. ? ?Discharge Condition: good ? ?Disposition: Discharge disposition: 01-Home or Self Care ? ? ? ? ? ? ?Diet: Regular diet ? ?Discharge Activity: Activity as tolerated ? ?Discharge Instructions   ? ? Notify physician for a general feeling that "something is not right"   Complete by: As directed ?  ? Notify physician for increase or change in vaginal discharge   Complete by: As directed ?  ? Notify physician for intestinal cramps, with or without diarrhea, sometimes described as "gas pain"   Complete by: As directed ?  ? Notify physician for leaking of fluid   Complete by: As directed ?  ? Notify physician for low, dull backache, unrelieved by heat or Tylenol   Complete by: As directed ?  ? Notify physician for menstrual like cramps   Complete by: As directed ?  ? Notify physician for pelvic pressure   Complete by: As directed ?  ? Notify physician for uterine contractions.  These may be painless and feel like the uterus is tightening or the baby is  "balling up"   Complete by: As directed ?  ? Notify physician for vaginal bleeding   Complete by: As directed ?  ? PRETERM LABOR:  Includes any of the follwing symptoms that occur between 20 - [redacted] weeks gestation.  If these symptoms are not stopped, preterm labor can result in preterm delivery, placing your baby at risk   Complete by: As directed ?  ? ?  ? ?Allergies as of 02/14/2022   ? ?   Reactions  ? Cefzil [cefprozil] Anaphylaxis  ? Prednisone Nausea Only  ? ?   ? ?  ?Medication List  ?  ? ?TAKE these medications   ? ?cyclobenzaprine 10 MG tablet ?Commonly known as: FLEXERIL ?Take 0.5 tablets (5 mg total) by mouth 3 (three) times daily as needed for muscle spasms. ?  ?nitrofurantoin (macrocrystal-monohydrate) 100 MG capsule ?Commonly known as: MACROBID ?Take 1 capsule (100 mg total) by mouth 2 (two) times daily. ?  ?PRENATAL 1 PO ?Take by mouth. ?  ? ?  ? ? ? ?Total time spent taking care of this patient: 40 minutes ? ?Signed: ?  [name], CNM  ?02/14/2022, 11:59 AM ? ?

## 2022-02-14 NOTE — Discharge Summary (Signed)
? ?  Please see Final Progress Note. ? ?Imagene Riches, CNM  ?02/14/2022 11:59 AM  ? ?

## 2022-02-14 NOTE — Progress Notes (Signed)
Patient given discharge instructions, and patient verbalizes understanding. Patient ambulatory home in good condition with significant other.  ?

## 2022-02-14 NOTE — OB Triage Note (Signed)
Patient is a G1P0, [redacted]w[redacted]d that presents with right lower back and abdominal pain. Patient states that the pain started yesterday and it is constant and it will worsen with activity. Patient rates her pain as a 3 on a 0-10 pain scale at this time, with the worst pain being a 5 on a 0-10 pain scale. Patient states that baby movement is normal for her. No contractions noted by patient. Patient has no LOF or bleeding. Patient states that she has endometriosis. FHT 154. Toco applied. VSS. Will continue to monitor ?

## 2022-02-15 LAB — URINE CULTURE: Culture: NO GROWTH

## 2022-02-16 ENCOUNTER — Encounter: Payer: Self-pay | Admitting: Obstetrics

## 2022-03-06 ENCOUNTER — Ambulatory Visit
Admission: RE | Admit: 2022-03-06 | Discharge: 2022-03-06 | Disposition: A | Discharge status: Home / Self Care | Payer: BC Managed Care – PPO | Source: Ambulatory Visit | Attending: Obstetrics and Gynecology | Admitting: Obstetrics and Gynecology

## 2022-03-06 ENCOUNTER — Other Ambulatory Visit: Payer: Self-pay

## 2022-03-06 DIAGNOSIS — Z3689 Encounter for other specified antenatal screening: Secondary | ICD-10-CM | POA: Diagnosis not present

## 2022-03-06 DIAGNOSIS — Z3402 Encounter for supervision of normal first pregnancy, second trimester: Secondary | ICD-10-CM | POA: Insufficient documentation

## 2022-03-06 DIAGNOSIS — Z3A24 24 weeks gestation of pregnancy: Secondary | ICD-10-CM | POA: Insufficient documentation

## 2022-03-06 DIAGNOSIS — Z3492 Encounter for supervision of normal pregnancy, unspecified, second trimester: Secondary | ICD-10-CM | POA: Diagnosis not present

## 2022-03-06 DIAGNOSIS — Z362 Encounter for other antenatal screening follow-up: Secondary | ICD-10-CM | POA: Insufficient documentation

## 2022-03-07 ENCOUNTER — Ambulatory Visit (INDEPENDENT_AMBULATORY_CARE_PROVIDER_SITE_OTHER): Payer: BC Managed Care – PPO | Admitting: Obstetrics

## 2022-03-07 VITALS — BP 120/74 | Wt 189.0 lb

## 2022-03-07 DIAGNOSIS — Z3A24 24 weeks gestation of pregnancy: Secondary | ICD-10-CM

## 2022-03-07 DIAGNOSIS — Z3402 Encounter for supervision of normal first pregnancy, second trimester: Secondary | ICD-10-CM

## 2022-03-07 NOTE — Progress Notes (Signed)
Routine Prenatal Care Visit ? ?Subjective  ?Kristen Galloway is a 26 y.o. G1P0000 at [redacted]w[redacted]d being seen today for ongoing prenatal care.  She is currently monitored for the following issues for this high-risk pregnancy and has Chronic pelvic pain in female; Endometriosis; Migraines; Anemia; Avitaminosis D; Enlarged uterus; Ruptured ovarian cyst; Family history of colon cancer requiring screening colonoscopy; Menorrhagia with regular cycle; Dysmenorrhea; Low grade squamous intraepith lesion on cytologic smear cervix (lgsil); Dysplasia of cervix, high grade CIN 2; Supervision of normal pregnancy; and Back pain affecting pregnancy in second trimester on their problem list.  ?----------------------------------------------------------------------------------- ?Patient reports no complaints.   ?Contractions: Not present. Vag. Bleeding: None.  Movement: Present. Leaking Fluid denies.  ?----------------------------------------------------------------------------------- ?The following portions of the patient's history were reviewed and updated as appropriate: allergies, current medications, past family history, past medical history, past social history, past surgical history and problem list. Problem list updated. ? ?Objective  ?Blood pressure 120/74, weight 189 lb (85.7 kg), last menstrual period 09/19/2021. ?Pregravid weight 182 lb (82.6 kg) Total Weight Gain 7 lb (3.175 kg) ?Urinalysis: Urine Protein    Urine Glucose   ? ?Fetal Status:     Movement: Present    ? ?General:  Alert, oriented and cooperative. Patient is in no acute distress.  ?Skin: Skin is warm and dry. No rash noted.   ?Cardiovascular: Normal heart rate noted  ?Respiratory: Normal respiratory effort, no problems with respiration noted  ?Abdomen: Soft, gravid, appropriate for gestational age. Pain/Pressure: Absent     ?Pelvic:  Cervical exam deferred        ?Extremities: Normal range of motion.     ?Mental Status: Normal mood and affect. Normal  behavior. Normal judgment and thought content.  ? ?Assessment  ? ?26 y.o. G1P0000 at [redacted]w[redacted]d by  06/26/2022, by Last Menstrual Period presenting for routine prenatal visit ? ?Plan  ? ?pregnancy Problems (from 11/15/21 to present)   ? Problem Noted Resolved  ? Supervision of normal pregnancy 11/15/2021 by [name], CNM No  ? Overview Addendum 03/07/2022  9:55 AM by Catalina Antigua, MD  ?   ?Nursing Staff Provider  ?Office Location  [facility] Dating  EDD 8/29, CW L MP  ?Language  English Anatomy US  normal  ?Flu Vaccine   Genetic Screen  NIPS:  ?AFP neg  ?TDaP vaccine    Hgb A1C or  ?GTT Early : ?Third trimester :   ?Covid    LAB RESULTS   ?Rhogam   Blood Type O/Positive/-- (02/16 1426)   ?Feeding Plan  Antibody Negative (02/16 1426)  ?Contraception  Rubella <0.90 (02/16 1426)  ?Circumcision  RPR Non Reactive (02/16 1426)   ?Pediatrician   HBsAg Negative (02/16 1426)   ?Support Person  HIV Non Reactive (02/16 1426)  ?Prenatal Classes  Varicella   ?  GBS  (For PCN allergy, check sensitivities)   ?BTL Consent     ?VBAC Consent  Pap    ?  Hgb Electro    ?  CF   ?   SMA   ?     ? ? ?  ?  ?  ?  ? ?Preterm labor symptoms and general obstetric precautions including but not limited to vaginal bleeding, contractions, leaking of fluid and fetal movement were reviewed in detail with the patient. ?Please refer to After Visit Summary for other counseling recommendations.  ?Discussed her lab work next visit. ? ?Return in about 4 weeks (around 04/04/2022) for return OB, 28 wk labs. ? ?Claris Che  Lynett Fish, CNM  ?03/07/2022 5:20 PM   ? ?

## 2022-03-07 NOTE — Progress Notes (Signed)
No vb. No lof.  

## 2022-03-08 ENCOUNTER — Encounter: Payer: BC Managed Care – PPO | Admitting: Advanced Practice Midwife

## 2022-04-04 ENCOUNTER — Other Ambulatory Visit: Payer: BC Managed Care – PPO

## 2022-04-04 ENCOUNTER — Encounter: Payer: BC Managed Care – PPO | Admitting: Obstetrics

## 2022-04-04 ENCOUNTER — Ambulatory Visit (INDEPENDENT_AMBULATORY_CARE_PROVIDER_SITE_OTHER): Payer: BC Managed Care – PPO | Admitting: Obstetrics

## 2022-04-04 VITALS — BP 118/60 | Wt 198.6 lb

## 2022-04-04 DIAGNOSIS — Z23 Encounter for immunization: Secondary | ICD-10-CM

## 2022-04-04 DIAGNOSIS — Z3400 Encounter for supervision of normal first pregnancy, unspecified trimester: Secondary | ICD-10-CM

## 2022-04-04 DIAGNOSIS — O99210 Obesity complicating pregnancy, unspecified trimester: Secondary | ICD-10-CM

## 2022-04-04 DIAGNOSIS — Z3A28 28 weeks gestation of pregnancy: Secondary | ICD-10-CM

## 2022-04-04 DIAGNOSIS — Z2839 Other underimmunization status: Secondary | ICD-10-CM | POA: Insufficient documentation

## 2022-04-04 DIAGNOSIS — Z3402 Encounter for supervision of normal first pregnancy, second trimester: Secondary | ICD-10-CM | POA: Diagnosis not present

## 2022-04-04 DIAGNOSIS — O09899 Supervision of other high risk pregnancies, unspecified trimester: Secondary | ICD-10-CM | POA: Insufficient documentation

## 2022-04-04 DIAGNOSIS — O26849 Uterine size-date discrepancy, unspecified trimester: Secondary | ICD-10-CM

## 2022-04-04 LAB — POCT URINALYSIS DIPSTICK OB
Glucose, UA: NEGATIVE
POC,PROTEIN,UA: NEGATIVE

## 2022-04-04 NOTE — Progress Notes (Signed)
Subjective: Patient returns for pregnancy follow-up. She has no concerns.  She reports good fetal movement, denies vaginal bleeding or leakage of fluid. She is having BH contractions. She denies urinary symptoms. Denies headaches, blurred vision or epigastric pain.   Objective: BP 118/60   Wt 198 lb 9.6 oz (90.1 kg)   LMP 09/19/2021 (Exact Date)   BMI 35.18 kg/m  Physical Exam Vitals and nursing note reviewed.  Constitutional:      Appearance: Normal appearance.  HENT:     Head: Normocephalic and atraumatic.  Eyes:     Extraocular Movements: Extraocular movements intact.  Pulmonary:     Effort: Pulmonary effort is normal.  Abdominal:     Palpations: Mass: gravid FH 31.  Musculoskeletal:        General: Normal range of motion.     Cervical back: Normal range of motion.  Skin:    General: Skin is warm and dry.  Neurological:     General: No focal deficit present.     Mental Status: She is alert and oriented to person, place, and time.  Psychiatric:        Mood and Affect: Mood normal.        Behavior: Behavior normal.        Thought Content: Thought content normal.   Assessment and Plan:  Patient Kathleen Tamm is an 26 y.o. year old G1P0000 at 54w1dwith EDC of Estimated Date of Delivery: 06/26/22 with Supervision of normal first pregnancy, antepartum - Plan: Protein / creatinine ratio, urine, Comp Met (CMET), TSH Rfx on Abnormal to Free T4, Hepatitis C antibody  [redacted] weeks gestation of pregnancy - Plan: POC Urinalysis Dipstick OB, Glucose, 1 hour, CBC, RPR, HIV Antibody (routine testing w rflx), Antibody screen  Susceptible to varicella (non-immune), currently pregnant  Rubella non-immune status, antepartum  Obesity in pregnancy, antepartum - Plan: Protein / creatinine ratio, urine, Comp Met (CMET), TSH Rfx on Abnormal to Free T4  Need for diphtheria-tetanus-pertussis (Tdap) vaccine  Uterine size date discrepancy, antepartum  Labs: O pos  RNI MMR PP VNI  Varivax PP  Glucola/labs today - adding additional labs as above Genetics: CFDNA low risk MSAFP neg Immunizations: desires tdap today - missed. Will do next visit  BMI 32 - Plan growth [place]36 weeks; measure S>D today re-eval next visit if earlier growth [place]. Labs added.   Return in about 2 weeks (around 04/18/2022) for routine OB.

## 2022-04-04 NOTE — Progress Notes (Signed)
Patient believes she experienced braxton hicks contractions

## 2022-04-04 NOTE — Patient Instructions (Signed)
Please call if you have elevated blood pressure, vision changes, headache, right-sided upper abdominal pain.  Please call if you have contractions occurring every 5-10 minutes for 1-2 hours, if you have vaginal bleeding, if watery fluid is leaking from your vagina, or if you have decreased fetal movement.  If you are not yet signed up on [facility] MyChart, please ask us how to sign up for it!  

## 2022-04-05 LAB — 28 WEEK RH+PANEL
Basophils Absolute: 0.1 10*3/uL (ref 0.0–0.2)
Basos: 0 %
EOS (ABSOLUTE): 0.1 10*3/uL (ref 0.0–0.4)
Eos: 1 %
Gestational Diabetes Screen: 95 mg/dL (ref 70–139)
HIV Screen 4th Generation wRfx: NONREACTIVE
Hematocrit: 35.5 % (ref 34.0–46.6)
Hemoglobin: 11.6 g/dL (ref 11.1–15.9)
Immature Grans (Abs): 0.1 10*3/uL (ref 0.0–0.1)
Immature Granulocytes: 1 %
Lymphocytes Absolute: 1.8 10*3/uL (ref 0.7–3.1)
Lymphs: 14 %
MCH: 26.5 pg — ABNORMAL LOW (ref 26.6–33.0)
MCHC: 32.7 g/dL (ref 31.5–35.7)
MCV: 81 fL (ref 79–97)
Monocytes Absolute: 1.1 10*3/uL — ABNORMAL HIGH (ref 0.1–0.9)
Monocytes: 9 %
Neutrophils Absolute: 9.5 10*3/uL — ABNORMAL HIGH (ref 1.4–7.0)
Neutrophils: 75 %
Platelets: 295 10*3/uL (ref 150–450)
RBC: 4.37 x10E6/uL (ref 3.77–5.28)
RDW: 13.7 % (ref 11.7–15.4)
RPR Ser Ql: NONREACTIVE
WBC: 12.6 10*3/uL — ABNORMAL HIGH (ref 3.4–10.8)

## 2022-04-05 LAB — COMPREHENSIVE METABOLIC PANEL
ALT: 18 IU/L (ref 0–32)
AST: 20 IU/L (ref 0–40)
Albumin/Globulin Ratio: 1.5 (ref 1.2–2.2)
Albumin: 3.9 g/dL (ref 3.9–5.0)
Alkaline Phosphatase: 107 IU/L (ref 44–121)
BUN/Creatinine Ratio: 13 (ref 9–23)
BUN: 6 mg/dL (ref 6–20)
Bilirubin Total: 0.4 mg/dL (ref 0.0–1.2)
CO2: 22 mmol/L (ref 20–29)
Calcium: 9.4 mg/dL (ref 8.7–10.2)
Chloride: 100 mmol/L (ref 96–106)
Creatinine, Ser: 0.46 mg/dL — ABNORMAL LOW (ref 0.57–1.00)
Globulin, Total: 2.6 g/dL (ref 1.5–4.5)
Glucose: 99 mg/dL (ref 70–99)
Potassium: 4 mmol/L (ref 3.5–5.2)
Sodium: 138 mmol/L (ref 134–144)
Total Protein: 6.5 g/dL (ref 6.0–8.5)
eGFR: 134 mL/min/{1.73_m2} (ref >59)

## 2022-04-05 LAB — PROTEIN / CREATININE RATIO, URINE
Creatinine, Urine: 49.5 mg/dL
Protein, Ur: 7.5 mg/dL
Protein/Creat Ratio: 152 mg/g creat (ref 0–200)

## 2022-04-05 LAB — HEPATITIS C ANTIBODY: Hep C Virus Ab: NONREACTIVE

## 2022-04-05 LAB — TSH RFX ON ABNORMAL TO FREE T4: TSH: 1.1 u[IU]/mL (ref 0.450–4.500)

## 2022-04-17 ENCOUNTER — Encounter: Payer: Self-pay | Admitting: Advanced Practice Midwife

## 2022-04-17 ENCOUNTER — Ambulatory Visit (INDEPENDENT_AMBULATORY_CARE_PROVIDER_SITE_OTHER): Payer: BC Managed Care – PPO | Admitting: Advanced Practice Midwife

## 2022-04-17 VITALS — BP 120/80 | Wt 202.0 lb

## 2022-04-17 DIAGNOSIS — Z23 Encounter for immunization: Secondary | ICD-10-CM | POA: Diagnosis not present

## 2022-04-17 DIAGNOSIS — Z3403 Encounter for supervision of normal first pregnancy, third trimester: Secondary | ICD-10-CM | POA: Diagnosis not present

## 2022-04-17 DIAGNOSIS — Z3A30 30 weeks gestation of pregnancy: Secondary | ICD-10-CM

## 2022-04-17 LAB — POCT URINALYSIS DIPSTICK OB
Glucose, UA: NEGATIVE
POC,PROTEIN,UA: NEGATIVE

## 2022-04-17 NOTE — Progress Notes (Signed)
Routine Prenatal Care Visit  Subjective  Kristen Galloway is a 26 y.o. G1P0000 at [redacted]w[redacted]d being seen today for ongoing prenatal care.  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  ----------------------------------------------------------------------------------- Patient reports occasional braxton hicks.   Contractions: Not present. Vag. Bleeding: None.  Movement: Present. Leaking Fluid denies.  ----------------------------------------------------------------------------------- The following portions of the patient's history were reviewed and updated as appropriate: allergies, current medications, past family history, past medical history, past social history, past surgical history and problem list. Problem list updated.  Objective  Blood pressure 120/80, weight 202 lb (91.6 kg), last menstrual period 09/19/2021. Pregravid weight 182 lb (82.6 kg) Total Weight Gain 20 lb (9.072 kg) Urinalysis: Urine Protein    Urine Glucose    Fetal Status: Fetal Heart Rate (bpm): 136 Fundal Height: 31 cm Movement: Present     General:  Alert, oriented and cooperative. Patient is in no acute distress.  Skin: Skin is warm and dry. No rash noted.   Cardiovascular: Normal heart rate noted  Respiratory: Normal respiratory effort, no problems with respiration noted  Abdomen: Soft, gravid, appropriate for gestational age.  Pain/Pressure: Absent     Pelvic:  Cervical exam deferred        Extremities: Normal range of motion.  Edema: None  Mental Status: Normal mood and affect. Normal behavior. Normal judgment and thought content.   Assessment   26 y.o. G1P0000 at [redacted]w[redacted]d by  06/26/2022, by Last Menstrual Period presenting for routine prenatal visit  Plan   pregnancy Problems (from 11/15/21 to present)    Problem Noted Resolved   Supervision of normal pregnancy 11/15/2021 by [name], CNM No   Overview Addendum 04/09/2022  2:34 PM by [name], CNM     Nursing Staff Provider  Office Location  [facility] Dating  EDD 8/29, CW L MP  Language  English Anatomy US  normal  Flu Vaccine   Genetic Screen  NIPS: negative. Does not want to know gender AFP neg  TDaP vaccine   04/17/22 Hgb A1C or  GTT Early : Third trimester : 95  Covid    LAB RESULTS   Rhogam   Blood Type O/Positive/-- (02/16 1426)   Feeding Plan  Antibody Negative (02/16 1426)  Contraception  Rubella <0.90 (02/16 1426)  Circumcision  RPR Non Reactive (02/16 1426)   Pediatrician   HBsAg Negative (02/16 1426)   Support Person  HIV Non Reactive (02/16 1426)  Prenatal Classes  Varicella     GBS  (For PCN allergy, check sensitivities)   BTL Consent     VBAC Consent  Pap  NILM    Hgb Electro      CF      SMA                   Preterm labor symptoms and general obstetric precautions including but not limited to vaginal bleeding, contractions, leaking of fluid and fetal movement were reviewed in detail with the patient. Please refer to After Visit Summary for other counseling recommendations.  Return in about 2 weeks (around 05/01/2022) for rob.  Tresea Mall, CNM 04/17/2022 1:37 PM

## 2022-04-17 NOTE — Addendum Note (Signed)
Addended by: Cornelius Moras D on: 04/17/2022 01:46 PM   Modules accepted: Orders

## 2022-04-17 NOTE — Patient Instructions (Signed)

## 2022-04-30 ENCOUNTER — Ambulatory Visit (INDEPENDENT_AMBULATORY_CARE_PROVIDER_SITE_OTHER): Payer: BC Managed Care – PPO | Admitting: Advanced Practice Midwife

## 2022-04-30 ENCOUNTER — Encounter: Payer: Self-pay | Admitting: Advanced Practice Midwife

## 2022-04-30 VITALS — BP 110/62 | Wt 205.0 lb

## 2022-04-30 DIAGNOSIS — Z3403 Encounter for supervision of normal first pregnancy, third trimester: Secondary | ICD-10-CM

## 2022-04-30 DIAGNOSIS — Z3A31 31 weeks gestation of pregnancy: Secondary | ICD-10-CM

## 2022-04-30 LAB — POCT URINALYSIS DIPSTICK OB
Glucose, UA: NEGATIVE
POC,PROTEIN,UA: NEGATIVE

## 2022-04-30 NOTE — Progress Notes (Signed)
Routine Prenatal Care Visit  Subjective  Kristen Galloway is a 26 y.o. G1P0000 at [redacted]w[redacted]d being seen today for ongoing prenatal care.  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  ----------------------------------------------------------------------------------- Patient reports no complaints. Labor protocols reviewed. She prefers IV access without fluids unless needed.  Contractions: Not present. Vag. Bleeding: None.  Movement: Present. Leaking Fluid denies.  ----------------------------------------------------------------------------------- The following portions of the patient's history were reviewed and updated as appropriate: allergies, current medications, past family history, past medical history, past social history, past surgical history and problem list. Problem list updated.  Objective  Blood pressure 110/62, weight 205 lb (93 kg), last menstrual period 09/19/2021. Pregravid weight 182 lb (82.6 kg) Total Weight Gain 23 lb (10.4 kg) Urinalysis: Urine Protein Negative  Urine Glucose Negative  Fetal Status: Fetal Heart Rate (bpm): 147 Fundal Height: 32 cm Movement: Present     General:  Alert, oriented and cooperative. Patient is in no acute distress.  Skin: Skin is warm and dry. No rash noted.   Cardiovascular: Normal heart rate noted  Respiratory: Normal respiratory effort, no problems with  respiration noted  Abdomen: Soft, gravid, appropriate for gestational age. Pain/Pressure: Absent     Pelvic:  Cervical exam deferred        Extremities: Normal range of motion.  Edema: None  Mental Status: Normal mood and affect. Normal behavior. Normal judgment and thought content.   Assessment   26 y.o. G1P0000 at [redacted]w[redacted]d by  06/26/2022, by Last Menstrual Period presenting for routine prenatal visit  Plan   pregnancy Problems (from 11/15/21 to present)    Problem Noted Resolved   Supervision of normal pregnancy 11/15/2021 by [name], CNM No   Overview Addendum 04/30/2022 10:39 AM by Rocco Serene, LPN     Nursing Staff Provider  Office Location  [facility] Dating  EDD 8/29, CW L MP  Language  English Anatomy US  normal  Flu Vaccine   Genetic Screen  NIPS: negative. Does not want to know gender AFP neg  TDaP vaccine   04/17/22 Hgb A1C or  GTT Early : Third trimester : 95  Covid    LAB RESULTS   Rhogam   Blood Type O/Positive/-- (02/16 1426)   Feeding Plan Breast Antibody Negative (02/16 1426)  Contraception Pills Rubella <0.90 (02/16 1426)  Circumcision  RPR Non Reactive (02/16 1426)   Pediatrician  [facility] HBsAg Negative (02/16 1426)   Support Person  HIV Non Reactive (02/16 1426)  Prenatal Classes  Varicella     GBS  (For PCN allergy, check sensitivities)   BTL Consent     VBAC Consent  Pap  NILM    Hgb Electro      CF      SMA                   Preterm labor symptoms and general obstetric precautions including but not limited to vaginal bleeding, contractions, leaking of fluid and fetal movement were reviewed in detail with the patient. Please refer to After  Visit Summary for other counseling recommendations.   Return in about 2 weeks (around 05/14/2022) for rob.  Tresea Mall, CNM 04/30/2022 10:54 AM

## 2022-05-11 ENCOUNTER — Telehealth: Payer: Self-pay

## 2022-05-11 NOTE — Telephone Encounter (Signed)
Left voicemail advising can use OTC Monistat topically to area. Use dove sensitive soap only. Can assess further at 05/15/22 appointment. Patient advised to return call with any further questions/concerns.

## 2022-05-11 NOTE — Telephone Encounter (Signed)
[facility] VOICEMAIL: Patient returning call. OK to leave detailed message on cell #[phone].

## 2022-05-11 NOTE — Telephone Encounter (Signed)
[facility] VOICEMAIL: Patient reports she is [redacted] wk pregnant. She has an appointment on 7/18, but randomly started getting itching in clitoris area that is intermittent. Inquiring if it can wait or needs to be addressed before then. [phone]

## 2022-05-11 NOTE — Telephone Encounter (Signed)
Left voicemail to return call. 

## 2022-05-15 ENCOUNTER — Other Ambulatory Visit (HOSPITAL_COMMUNITY)
Admission: RE | Admit: 2022-05-15 | Discharge: 2022-05-15 | Disposition: A | Discharge status: Home / Self Care | Payer: BC Managed Care – PPO | Source: Ambulatory Visit | Attending: Obstetrics | Admitting: Obstetrics

## 2022-05-15 ENCOUNTER — Ambulatory Visit (INDEPENDENT_AMBULATORY_CARE_PROVIDER_SITE_OTHER): Payer: BC Managed Care – PPO | Admitting: Obstetrics

## 2022-05-15 VITALS — BP 118/74 | Wt 206.0 lb

## 2022-05-15 DIAGNOSIS — Z3403 Encounter for supervision of normal first pregnancy, third trimester: Secondary | ICD-10-CM | POA: Diagnosis not present

## 2022-05-15 DIAGNOSIS — Z3A34 34 weeks gestation of pregnancy: Secondary | ICD-10-CM

## 2022-05-15 DIAGNOSIS — O26893 Other specified pregnancy related conditions, third trimester: Secondary | ICD-10-CM

## 2022-05-15 DIAGNOSIS — N898 Other specified noninflammatory disorders of vagina: Secondary | ICD-10-CM | POA: Insufficient documentation

## 2022-05-15 NOTE — Progress Notes (Signed)
Pt wants to be checked for a yeast infection. Having some vaginal itching.

## 2022-05-15 NOTE — Progress Notes (Signed)
Routine Prenatal Care Visit  Subjective  Kristen Galloway is a 26 y.o. G1P0000 at [redacted]w[redacted]d being seen today for ongoing prenatal care.  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  ----------------------------------------------------------------------------------- Patient reports vaginal irritation and and some itching. she would like to see if she has a yeast infection..   Contractions: Not present. Vag. Bleeding: None.  Movement: Present. Leaking Fluid denies.  ----------------------------------------------------------------------------------- The following portions of the patient's history were reviewed and updated as appropriate: allergies, current medications, past family history, past medical history, past social history, past surgical history and problem list. Problem list updated.  Objective  Blood pressure 118/74, weight 206 lb (93.4 kg), last menstrual period 09/19/2021. Pregravid weight 182 lb (82.6 kg) Total Weight Gain 24 lb (10.9 kg) Urinalysis: Urine Protein    Urine Glucose    Fetal Status:     Movement: Present     General:  Alert, oriented and cooperative. Patient is in no acute distress.  Skin: Skin is warm and dry. No rash noted.   Cardiovascular: Normal heart rate noted  Respiratory: Normal respiratory effort, no problems with respiration noted  Abdomen: Soft, gravid, appropriate for  gestational age. Pain/Pressure: Absent     Pelvic:  Cervical exam deferred        Extremities: Normal range of motion.     Mental Status: Normal mood and affect. Normal behavior. Normal judgment and thought content.   Assessment   26 y.o. G1P0000 at [redacted]w[redacted]d by  06/26/2022, by Last Menstrual Period presenting for routine prenatal visit  Plan   pregnancy Problems (from 11/15/21 to present)    Problem Noted Resolved   Supervision of normal pregnancy 11/15/2021 by [name], CNM No   Overview Addendum 04/30/2022 10:39 AM by Rocco Serene, LPN     Nursing Staff Provider  Office Location  [facility] Dating  EDD 8/29, CW L MP  Language  English Anatomy US  normal  Flu Vaccine   Genetic Screen  NIPS: negative. Does not want to know gender AFP neg  TDaP vaccine   04/17/22 Hgb A1C or  GTT Early : Third trimester : 95  Covid    LAB RESULTS   Rhogam   Blood Type O/Positive/-- (02/16 1426)   Feeding Plan Breast Antibody Negative (02/16 1426)  Contraception Pills Rubella <0.90 (02/16 1426)  Circumcision  RPR Non Reactive (02/16 1426)   Pediatrician  [facility] HBsAg Negative (02/16 1426)   Support Person  HIV Non Reactive (02/16 1426)  Prenatal Classes  Varicella     GBS  (For PCN allergy, check sensitivities)   BTL Consent     VBAC Consent  Pap  NILM    Hgb Electro      CF      SMA                   Preterm labor symptoms and general obstetric precautions including but not limited to vaginal bleeding, contractions, leaking of fluid and fetal movement were reviewed in detail with the patient. Please refer  to After Visit Summary for other counseling recommendations.   She is tested today for yeast vaginitis . Atptma swab sent.  Return in about 2 weeks (around 05/29/2022) for return OB, GBS, cultures.  [name], CNM  05/15/2022 11:41 AM

## 2022-05-16 LAB — CERVICOVAGINAL ANCILLARY ONLY
Bacterial Vaginitis (gardnerella): POSITIVE — AB
Candida Glabrata: NEGATIVE
Candida Vaginitis: POSITIVE — AB
Comment: NEGATIVE
Comment: NEGATIVE
Comment: NEGATIVE

## 2022-05-18 DIAGNOSIS — L821 Other seborrheic keratosis: Secondary | ICD-10-CM | POA: Diagnosis not present

## 2022-05-25 ENCOUNTER — Other Ambulatory Visit: Payer: Self-pay | Admitting: Obstetrics

## 2022-05-25 ENCOUNTER — Encounter: Payer: Self-pay | Admitting: Obstetrics

## 2022-05-25 DIAGNOSIS — Z3403 Encounter for supervision of normal first pregnancy, third trimester: Secondary | ICD-10-CM

## 2022-05-25 DIAGNOSIS — B9689 Other specified bacterial agents as the cause of diseases classified elsewhere: Secondary | ICD-10-CM

## 2022-05-25 DIAGNOSIS — B3731 Acute candidiasis of vulva and vagina: Secondary | ICD-10-CM

## 2022-05-25 MED ORDER — TERCONAZOLE 0.4 % VA CREA: 1.0000 | TOPICAL_CREAM | Freq: Every day | VAGINAL | 0 refills | Status: DC

## 2022-05-25 MED ORDER — METRONIDAZOLE 500 MG PO TABS: 500.0000 mg | ORAL_TABLET | Freq: Two times a day (BID) | ORAL | 0 refills | Status: AC

## 2022-05-28 ENCOUNTER — Other Ambulatory Visit (HOSPITAL_COMMUNITY)
Admission: RE | Admit: 2022-05-28 | Discharge: 2022-05-28 | Disposition: A | Discharge status: Home / Self Care | Payer: BC Managed Care – PPO | Source: Ambulatory Visit | Attending: Advanced Practice Midwife | Admitting: Advanced Practice Midwife

## 2022-05-28 ENCOUNTER — Ambulatory Visit (INDEPENDENT_AMBULATORY_CARE_PROVIDER_SITE_OTHER): Payer: BC Managed Care – PPO | Admitting: Advanced Practice Midwife

## 2022-05-28 ENCOUNTER — Encounter: Payer: Self-pay | Admitting: Advanced Practice Midwife

## 2022-05-28 VITALS — BP 120/80 | Wt 212.0 lb

## 2022-05-28 DIAGNOSIS — Z113 Encounter for screening for infections with a predominantly sexual mode of transmission: Secondary | ICD-10-CM | POA: Insufficient documentation

## 2022-05-28 DIAGNOSIS — Z3685 Encounter for antenatal screening for Streptococcus B: Secondary | ICD-10-CM

## 2022-05-28 DIAGNOSIS — Z3A35 35 weeks gestation of pregnancy: Secondary | ICD-10-CM

## 2022-05-28 DIAGNOSIS — Z3403 Encounter for supervision of normal first pregnancy, third trimester: Secondary | ICD-10-CM | POA: Diagnosis not present

## 2022-05-28 DIAGNOSIS — Z369 Encounter for antenatal screening, unspecified: Secondary | ICD-10-CM | POA: Diagnosis not present

## 2022-05-28 LAB — POCT URINALYSIS DIPSTICK OB
Glucose, UA: NEGATIVE
POC,PROTEIN,UA: NEGATIVE

## 2022-05-28 NOTE — Progress Notes (Signed)
Routine Prenatal Care Visit  Subjective  Kristen Galloway is a 26 y.o. G1P0000 at [redacted]w[redacted]d being seen today for ongoing prenatal care.  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  ----------------------------------------------------------------------------------- Patient reports no complaints.  She wonders about colostrum harvesting prior to delivery. Explained it is not necessary to do and reviewed physiology of oxytocin response. She will wait until closer to 40 weeks if she does pump. Contractions: Not present. Vag. Bleeding: None.  Movement: Present. Leaking Fluid denies.  ----------------------------------------------------------------------------------- The following portions of the patient's history were reviewed and updated as appropriate: allergies, current medications, past family history, past medical history, past social history, past surgical history and problem list. Problem list updated.  Objective  Blood pressure 120/80, weight 212 lb (96.2 kg), last menstrual period 09/19/2021. Pregravid weight 182 lb (82.6 kg) Total Weight Gain 30 lb (13.6 kg) Urinalysis: Urine Protein    Urine Glucose    Fetal Status: Fetal Heart Rate (bpm): 142 Fundal Height: 37 cm Movement: Present     General:  Alert, oriented and cooperative. Patient is in no acute distress.  Skin: Skin is warm and dry. No rash noted.    Cardiovascular: Normal heart rate noted  Respiratory: Normal respiratory effort, no problems with respiration noted  Abdomen: Soft, gravid, appropriate for gestational age. Pain/Pressure: Present     Pelvic:  GBS/aptima collected Dilation: Closed posterior  Extremities: Normal range of motion.     Mental Status: Normal mood and affect. Normal behavior. Normal judgment and thought content.   Assessment   26 y.o. G1P0000 at [redacted]w[redacted]d by  06/26/2022, by Last Menstrual Period presenting for routine prenatal visit  Plan   pregnancy Problems (from 11/15/21 to present)    Problem Noted Resolved   Supervision of normal pregnancy 11/15/2021 by [name], CNM No   Overview Addendum 04/30/2022 10:39 AM by Rocco Serene, LPN     Nursing Staff Provider  Office Location  [facility] Dating  EDD 8/29, CW L MP  Language  English Anatomy US  normal  Flu Vaccine   Genetic Screen  NIPS: negative. Does not want to know gender AFP neg  TDaP vaccine   04/17/22 Hgb A1C or  GTT Early : Third trimester : 95  Covid    LAB RESULTS   Rhogam   Blood Type O/Positive/-- (02/16 1426)   Feeding Plan Breast Antibody Negative (02/16 1426)  Contraception Pills Rubella <0.90 (02/16 1426)  Circumcision  RPR Non Reactive (02/16 1426)   Pediatrician  [facility] HBsAg Negative (02/16 1426)   Support Person  HIV Non Reactive (02/16 1426)  Prenatal Classes  Varicella     GBS  (For PCN allergy, check sensitivities)   BTL Consent     VBAC Consent  Pap  NILM    Hgb Electro      CF      SMA                   Preterm labor symptoms and general obstetric precautions including but not limited to  vaginal bleeding, contractions, leaking of fluid and fetal movement were reviewed in detail with the patient. Please refer to After Visit Summary for other counseling recommendations.   Return in about 1 week (around 06/04/2022) for rob.  Tresea Mall, CNM 05/28/2022 10:27 AM

## 2022-05-28 NOTE — Patient Instructions (Signed)
Pain Relief During Labor and Delivery Many things can cause pain during labor and delivery, including: Pressure due to the baby moving through the pelvis. Stretching of tissues due to the baby moving through the birth canal. Muscle tension due to anxiety or nervousness. The uterus tightening (contracting)and relaxing to help move the baby. How do I get pain relief during labor and delivery?  Discuss your pain relief options with your health care provider during your prenatal visits. Explore the options offered by your hospital or birth center. There are many ways to deal with the pain of labor and delivery. You can try relaxation techniques or doing relaxing activities, taking a warm shower or bath (hydrotherapy), or other methods. There are also many medicines available to help control pain. Relaxation techniques and activities Practice relaxation techniques or do relaxing activities, such as: Focused breathing. Meditation. Visualization. Aroma therapy. Listening to your favorite music. Hypnosis. Hydrotherapy Take a warm shower or bath. This may: Provide comfort and relaxation. Lessen your feeling of pain. Reduce the amount of pain medicine needed. Shorten the length of labor. Other methods Try doing other things, such as: Getting a massage or having counterpressure on your back. Applying warm packs or ice packs. Changing positions often, moving around, or using a birthing ball. Medicines You may be given: Pain medicine through an IV or an injection into a muscle. Pain medicine inserted into your spinal column. Injections of sterile water just under the skin on your lower back. Nitrous oxide inhalation therapy, also called laughing gas. What kinds of medicine are available for pain relief? There are two kinds of medicines that can be used to relieve pain during labor and delivery: Analgesics. These medicines decrease pain without causing you to lose feeling or the ability to move  your muscles. Anesthetics. These medicines block feeling in the body and can decrease your ability to move freely. Both kinds of medicine can cause minor side effects, such as nausea, trouble concentrating, and sleepiness. They can also affect the baby's heart rate before birth and his or her breathing after birth. For this reason, health care providers are careful about when and how much medicine is given. Which medicines are used to provide pain relief? Common medicines The most common medicines used to help manage pain during labor and delivery include: Opioids. Opioids are medicines that decrease how much pain you feel (perception of pain). These medicines can be given through an IV or may be used with anesthetics to block pain. Epidural analgesia. Epidural analgesia is given through a very thin tube that is inserted into the lower back. Medicine is delivered continuously to the area near your spinal column nerves (epidural space). After having this treatment, you may be able to move your legs, but you will not be able to walk. Depending on the amount and type of medicine given, you may lose all feeling in the lower half of your body, or you may have some sensation, including the urge to push. This treatment can be used to give pain relief for a vaginal birth. Sometimes, a numbing medicine is injected into the spinal fluid when an epidural catheter is placed. This provides for immediate relief but only lasts for 1-2 hours. Once it wears off, the epidural will provide pain relief. This is called a combined spinal-epidural (CSE) block. Intrathecal analgesia (spinal analgesia). Intrathecal analgesia is similar to epidural analgesia, but the medicine is injected into the spinal fluid instead of the epidural space. It is usually only given once.   It starts to relieve pain quickly, but the pain relief lasts only 1-2 hours. Pudendal block. This block is done by injecting numbing medicine through the wall of  the vagina and into a nerve in the pelvis. Other medicines Other medicines used to help manage pain during labor and delivery include: Local anesthetics. These are used to numb a small area of the body. They may be used along with another kind of medicine or used to numb the nerves of the vagina, cervix, and perineum during the second stage of labor. Spinal block (spinal anesthesia). Spinal anesthesia is similar to spinal analgesia, but the medicine that is used contains longer-acting numbing medicines and pain medicines. This type of anesthesia can be used for a cesarean delivery and allows you to stay awake for the birth of your baby. General anesthetics cause you to lose consciousness so you do not feel pain. They are usually only used for an emergency cesarean delivery. These medicines are given through an IV or a mask or both. These medicines are used as part of a procedure or for an emergency delivery. Summary Women have many options to help them manage the pain associated with labor and delivery. You can try doing relaxing activities, taking a warm shower or bath, or other methods. There are also many medicines available to help control pain during labor and delivery. Talk with your health care provider about what options are available to you. This information is not intended to replace advice given to you by your health care provider. Make sure you discuss any questions you have with your health care provider. Document Revised: 09/02/2019 Document Reviewed: 09/02/2019 Elsevier Patient Education  2023 Else[employer] Inc[employer] Signs and Symptoms of Labor Labor is the body's natural process of moving the baby and the placenta out of the uterus. The process of labor usually starts when the baby is full-term, between 39 and 41 weeks of pregnancy. Signs and symptoms that you are close to going into labor As your body prepares for labor and the birth of your baby, you may notice the following symptoms in  the weeks and days before true labor starts: Passing a small amount of thick, bloody mucus from your vagina. This is called normal bloody show or losing your mucus plug. This may happen more than a week before labor begins, or right before labor begins, as the opening of the cervix starts to widen (dilate). For some women, the entire mucus plug passes at once. For others, pieces of the mucus plug may gradually pass over several days. Your baby moving (dropping) lower in your pelvis to get into position for birth (lightening). When this happens, you may feel more pressure on your bladder and pelvic bone and less pressure on your ribs. This may make it easier to breathe. It may also cause you to need to urinate more often and have problems with bowel movements. Having "practice contractions," also called Braxton Hicks contractions or false labor. These occur at irregular (unevenly spaced) intervals that are more than 10 minutes apart. False labor contractions are common after exercise or sexual activity. They will stop if you change position, rest, or drink fluids. These contractions are usually mild and do not get stronger over time. They may feel like: A backache or back pain. Mild cramps, similar to menstrual cramps. Tightening or pressure in your abdomen. Other early symptoms include: Nausea or loss of appetite. Diarrhea. Having a sudden burst of energy, or feeling very tired. Mood changes. Having trouble   sleeping. Signs and symptoms that labor has begun Signs that you are in labor may include: Having contractions that come at regular (evenly spaced) intervals and increase in intensity. This may feel like more intense tightening or pressure in your abdomen that moves to your back. Contractions may also feel like rhythmic pain in your upper thighs or back that comes and goes at regular intervals. If you are delivering for the first time, this change in intensity of contractions often occurs at a  more gradual pace. If you have given birth before, you may notice a more rapid progression of contraction changes. Feeling pressure in the vaginal area. Your water breaking (rupture of membranes). This is when the sac of fluid that surrounds your baby breaks. Fluid leaking from your vagina may be clear or blood-tinged. Labor usually starts within 24 hours of your water breaking, but it may take longer to begin. Some people may feel a sudden gush of fluid; others may notice repeatedly damp underwear. Follow these instructions at home:  When labor starts, or if your water breaks, call your health care provider or nurse care line. Based on your situation, they will determine when you should go in for an exam. During early labor, you may be able to rest and manage symptoms at home. Some strategies to try at home include: Breathing and relaxation techniques. Taking a warm bath or shower. Listening to music. Using a heating pad on the lower back for pain. If directed, apply heat to the area as often as told by your health care provider. Use the heat source that your health care provider recommends, such as a moist heat pack or a heating pad. Place a towel between your skin and the heat source. Leave the heat on for 20-30 minutes. Remove the heat if your skin turns bright red. This is especially important if you are unable to feel pain, heat, or cold. You have a greater risk of getting burned. Contact a health care provider if: Your labor has started. Your water breaks. You have nausea, vomiting, or diarrhea. Get help right away if: You have painful, regular contractions that are 5 minutes apart or less. Labor starts before you are [redacted] weeks along in your pregnancy. You have a fever. You have bright red blood coming from your vagina. You do not feel your baby moving. You have a severe headache with or without vision problems. You have chest pain or shortness of breath. These symptoms may  represent a serious problem that is an emergency. Do not wait to see if the symptoms will go away. Get medical help right away. Call your local emergency services (911 in the [place] Do not drive yourself to the hospital. Summary Labor is your body's natural process of moving your baby and the placenta out of your uterus. The process of labor usually starts when your baby is full-term, between 39 and 40 weeks of pregnancy. When labor starts, or if your water breaks, call your health care provider or nurse care line. Based on your situation, they will determine when you should go in for an exam. This information is not intended to replace advice given to you by your health care provider. Make sure you discuss any questions you have with your health care provider. Document Revised: 02/28/2021 Document Reviewed: 02/28/2021 Elsevier Patient Education  2023 Elsevier Inc.  

## 2022-05-28 NOTE — Addendum Note (Signed)
Addended by: Cornelius Moras D on: 05/28/2022 10:36 AM   Modules accepted: Orders

## 2022-05-29 LAB — CERVICOVAGINAL ANCILLARY ONLY
Chlamydia: NEGATIVE
Comment: NEGATIVE
Comment: NEGATIVE
Comment: NORMAL
Neisseria Gonorrhea: NEGATIVE
Trichomonas: NEGATIVE

## 2022-05-30 LAB — STREP GP B NAA: Strep Gp B NAA: NEGATIVE

## 2022-06-06 ENCOUNTER — Ambulatory Visit (INDEPENDENT_AMBULATORY_CARE_PROVIDER_SITE_OTHER): Payer: BC Managed Care – PPO | Admitting: Obstetrics & Gynecology

## 2022-06-06 VITALS — BP 122/84 | Wt 214.4 lb

## 2022-06-06 DIAGNOSIS — Z3A37 37 weeks gestation of pregnancy: Secondary | ICD-10-CM

## 2022-06-06 LAB — POCT URINALYSIS DIPSTICK OB
Bilirubin, UA: NEGATIVE
Blood, UA: NEGATIVE
Glucose, UA: NEGATIVE
Ketones, UA: NEGATIVE
Leukocytes, UA: NEGATIVE
Nitrite, UA: NEGATIVE
POC,PROTEIN,UA: NEGATIVE
Spec Grav, UA: 1.01 (ref 1.010–1.025)
Urobilinogen, UA: 0.2 E.U./dL
pH, UA: 6 (ref 5.0–8.0)

## 2022-06-06 NOTE — Progress Notes (Signed)
ROB. Patient states  Patient states no questions or concerns at this time.   

## 2022-06-13 ENCOUNTER — Encounter: Payer: Self-pay | Admitting: Obstetrics and Gynecology

## 2022-06-13 ENCOUNTER — Observation Stay
Admission: EM | Admit: 2022-06-13 | Discharge: 2022-06-13 | Disposition: A | Discharge status: Home / Self Care | Payer: BC Managed Care – PPO | Attending: Licensed Practical Nurse | Admitting: Licensed Practical Nurse

## 2022-06-13 ENCOUNTER — Other Ambulatory Visit: Payer: Self-pay

## 2022-06-13 ENCOUNTER — Telehealth: Payer: Self-pay

## 2022-06-13 DIAGNOSIS — Z3A38 38 weeks gestation of pregnancy: Secondary | ICD-10-CM | POA: Diagnosis not present

## 2022-06-13 DIAGNOSIS — Z79899 Other long term (current) drug therapy: Secondary | ICD-10-CM | POA: Diagnosis not present

## 2022-06-13 DIAGNOSIS — O4193X0 Disorder of amniotic fluid and membranes, unspecified, third trimester, not applicable or unspecified: Principal | ICD-10-CM | POA: Insufficient documentation

## 2022-06-13 DIAGNOSIS — O26893 Other specified pregnancy related conditions, third trimester: Secondary | ICD-10-CM | POA: Diagnosis not present

## 2022-06-13 DIAGNOSIS — N898 Other specified noninflammatory disorders of vagina: Secondary | ICD-10-CM

## 2022-06-13 LAB — RUPTURE OF MEMBRANE (ROM)PLUS: Rom Plus: NEGATIVE

## 2022-06-13 LAB — URINALYSIS, COMPLETE (UACMP) WITH MICROSCOPIC
Bilirubin Urine: NEGATIVE
Glucose, UA: NEGATIVE mg/dL
Hgb urine dipstick: NEGATIVE
Ketones, ur: NEGATIVE mg/dL
Leukocytes,Ua: NEGATIVE
Nitrite: NEGATIVE
Protein, ur: NEGATIVE mg/dL
Specific Gravity, Urine: 1.004 — ABNORMAL LOW (ref 1.005–1.030)
pH: 7 (ref 5.0–8.0)

## 2022-06-13 LAB — WET PREP, GENITAL
Clue Cells Wet Prep HPF POC: NONE SEEN
Sperm: NONE SEEN
Trich, Wet Prep: NONE SEEN
WBC, Wet Prep HPF POC: <10 (ref <10)
Yeast Wet Prep HPF POC: NONE SEEN

## 2022-06-13 NOTE — Telephone Encounter (Signed)
Pt called triage reporting leaking a clear fluid.Per Dr Jean RosenthalLogan Galloway advise pt to go to L&D to get examined.Pt aware

## 2022-06-13 NOTE — OB Triage Note (Signed)
Patient Discharged home per provider. Pt educated about labor precautions and informed when to return to the ED for further evaluation. Pt instructed to keep all follow up appointments with her provider. AVS given to patient and RN answered all questions and patient has no further questions at this time. Pt discharged home in stable condition with significant other.   

## 2022-06-13 NOTE — OB Triage Note (Signed)
Pt is a G1P0 at [redacted]w[redacted]d that presents from ED with c/o LOF since 2000 last night. Pt states she has had thick white d/c lately but last night it became watery and was just a small amount. Pt states she had on a pad but took it off because she could not tell how much fluid it had in it. Pt denies VB, and states positive FM. EFM applied.

## 2022-06-13 NOTE — Discharge Summary (Signed)
Physician Final Progress Note  Patient ID: Kristen Galloway MRN: [id number] DOB/AGE: 1996/09/08 26 y.o.  Admit date: 06/13/2022 Admitting provider: Linzie Collin, MD Discharge date: 06/13/2022   Admission Diagnoses:  1) intrauterine pregnancy at [redacted]w[redacted]d  2) leaking clear fluid  Discharge Diagnoses:  Principal Problem:   Indication for care in labor and delivery, antepartum  Membranes intact   History of Present Illness: The patient is a 26 y.o. female G1P0000 at [redacted]w[redacted]d who presents for leaking clear fluid since 8pm last night.  Kristen Galloway has had white discharge for some time. Last night Kristen Galloway noticed a small amount of clear watery discharge. Kristen Galloway put on a pad for a while, then took it off because Kristen Galloway could not tell if it was wet or not. Kristen Galloway did not wear a pad in. Endorses +Fm, has had some cramping but otherwise denies contractions. Denies vaginal bleeding.   Pt has scheduled ROB tomorrow, pt prefers to cancel apt. Briefly discussed pregnancy related topics. Kristen Galloway has taken her CBE.  RNST. Ok to cancel tomorrows ROB and to RTC in 1 week.   Past Medical History:  Diagnosis Date   Anemia    Bacterial vaginosis    Chronic pelvic pain in female 06/09/2015   Dyspareunia in female    Headache    Ovarian cyst     Past Surgical History:  Procedure Laterality Date   LAPAROSCOPY N/A 06/09/2015   Procedure: LAPAROSCOPY DIAGNOSTIC;  Surgeon: Conard Novak, MD;  Location: [facility] ORS;  Service: Gynecology;  Laterality: N/A;   WISDOM TOOTH EXTRACTION      No current facility-administered medications on file prior to encounter.   Current Outpatient Medications on File Prior to Encounter  Medication Sig Dispense Refill   Magnesium 400 MG TABS      Prenatal MV-Min-Fe Fum-FA-DHA (PRENATAL 1 PO) Take by mouth.     terconazole (TERAZOL 7) 0.4 % vaginal cream Place 1 applicator vaginally at bedtime. (Patient not taking: Reported on 05/28/2022) 45 g 0    Allergies  Allergen Reactions    Cefzil [Cefprozil] Anaphylaxis   Prednisone Nausea Only    Social History   Socioeconomic History   Marital status: Married    Spouse name: Not on file   Number of children: Not on file   Years of education: Not on file   Highest education level: Not on file  Occupational History   Not on file  Tobacco Use   Smoking status: Never   Smokeless tobacco: Never  Vaping Use   Vaping Use: Never used  Substance and Sexual Activity   Alcohol use: Not Currently    Comment: occasional   Drug use: No   Sexual activity: Yes    Birth control/protection: Pill  Other Topics Concern   Not on file  Social History Narrative   Not on file   Social Determinants of Health   Financial Resource Strain: Not on file  Food Insecurity: Not on file  Transportation Needs: Not on file  Physical Activity: Not on file  Stress: Not on file  Social Connections: Not on file  Intimate Partner Violence: Not on file    Family History  Problem Relation Age of Onset   Endometriosis Mother    Colon cancer Paternal Grandmother    Endometriosis Maternal Grandmother    Melanoma Maternal Grandmother    Diabetes Maternal Grandfather    Diabetes Paternal Grandfather    Heart disease Paternal Grandfather      Review of Systems  Constitutional:  Negative.   Respiratory: Negative.    Cardiovascular: Negative.   Gastrointestinal: Negative.   Genitourinary:        Vaginal discharge   Neurological: Negative.   Psychiatric/Behavioral: Negative.       Physical Exam: BP 130/70   Pulse 99   Temp 98.3 F (36.8 C) (Oral)   Resp 16   Ht 5\' 3"  (1.6 m)   Wt 98.9 kg   LMP 09/19/2021 (Exact Date)   BMI 38.62 kg/m   Physical Exam Constitutional:      Appearance: Normal appearance.  Genitourinary:     Vulva normal.     Genitourinary Comments: SVE cl/TH/high   Pulmonary:     Effort: Pulmonary effort is normal.  Abdominal:     Tenderness: There is no abdominal tenderness.     Comments: Gravid Fundal  ht 40cm   Musculoskeletal:     Cervical back: Normal range of motion.     Right lower leg: No edema.     Left lower leg: No edema.  Neurological:     General: No focal deficit present.     Mental Status: Kristen Galloway is alert.  Skin:    General: Skin is warm.  Psychiatric:        Mood and Affect: Mood normal.   EFM: baseline 125, moderate variability, pos accel, neg decel  TOCO: irritability   Consults: None  Significant Findings/ Diagnostic Studies: ROM plus, Wet Prep and UA negative   Procedures: RNST   Hospital Course: The patient was admitted to Labor and Delivery Triage for observation. Kristen Galloway had a RNST. Her ROM plus and wet prep were found to be negative. Reviewed normal findings. Briefly discussed her pregnancy, pt does not have any concerns and prefers to cancel tomorrow's ROB.   Discharge Condition: stable  Disposition: Discharge disposition: 01-Home or Self Care       Diet: Regular diet  Discharge Activity: Activity as tolerated   Allergies as of 06/13/2022       Reactions   Cefzil [cefprozil] Anaphylaxis   Prednisone Nausea Only        Medication List     STOP taking these medications    terconazole 0.4 % vaginal cream Commonly known as: TERAZOL 7       TAKE these medications    Magnesium 400 MG Tabs   PRENATAL 1 PO Take by mouth.         Total time spent taking care of this patient: 20 minutes  Signed: 06/15/2022 Nayelie Gionfriddo, CNM  06/13/2022, 11:13 AM

## 2022-06-13 NOTE — Progress Notes (Signed)
CNM notified of pt arrival to unit and pending labs. Fern Negative.

## 2022-06-14 ENCOUNTER — Encounter: Payer: BC Managed Care – PPO | Admitting: Obstetrics & Gynecology

## 2022-06-21 ENCOUNTER — Ambulatory Visit (INDEPENDENT_AMBULATORY_CARE_PROVIDER_SITE_OTHER): Payer: BC Managed Care – PPO | Admitting: Obstetrics

## 2022-06-21 VITALS — BP 124/70 | Wt 217.0 lb

## 2022-06-21 DIAGNOSIS — Z3403 Encounter for supervision of normal first pregnancy, third trimester: Secondary | ICD-10-CM

## 2022-06-21 DIAGNOSIS — Z3A39 39 weeks gestation of pregnancy: Secondary | ICD-10-CM

## 2022-06-21 NOTE — Progress Notes (Signed)
Routine Prenatal Care Visit  Subjective  Kristen Galloway is a 26 y.o. G1P0000 at [redacted]w[redacted]d being seen today for ongoing prenatal care.  She is currently monitored for the following issues for this low-risk pregnancy and has Chronic pelvic pain in female; Endometriosis; Migraines; Anemia; Avitaminosis D; Enlarged uterus; Ruptured ovarian cyst; Family history of colon cancer requiring screening colonoscopy; Menorrhagia with regular cycle; Dysmenorrhea; Low grade squamous intraepith lesion on cytologic smear cervix (lgsil); Dysplasia of cervix, high grade CIN 2; Supervision of normal pregnancy; Back pain affecting pregnancy in second trimester; Susceptible to varicella (non-immune), currently pregnant; Rubella non-immune status, antepartum; and Indication for care in labor and delivery, antepartum on their problem list.  ----------------------------------------------------------------------------------- Patient reports no complaints.   Contractions: Irritability. Vag. Bleeding: None.  Movement: Present. Leaking Fluid denies.  ----------------------------------------------------------------------------------- The following portions of the patient's history were reviewed and updated as appropriate: allergies, current medications, past family history, past medical history, past social history, past surgical history and problem list. Problem list updated.  Objective  Blood pressure 124/70, weight 217 lb (98.4 kg), last menstrual period 09/19/2021. Pregravid weight 182 lb (82.6 kg) Total Weight Gain 35 lb (15.9 kg) Urinalysis: Urine Protein    Urine Glucose    Fetal Status:     Movement: Present     General:  Alert, oriented and cooperative. Patient is in no acute distress.  Skin: Skin is warm and dry. No rash noted.   Cardiovascular: Normal heart rate noted  Respiratory: Normal respiratory effort, no problems with respiration noted  Abdomen: Soft, gravid, appropriate for gestational age.  Pain/Pressure: Present     Pelvic:   Cervix si closed with palpable irregular surface         Extremities: Normal range of motion.     Mental Status: Normal mood and affect. Normal behavior. Normal judgment and thought content.   Assessment   26 y.o. G1P0000 at [redacted]w[redacted]d by  06/26/2022, by Last Menstrual Period presenting for routine prenatal visit  Plan   pregnancy Problems (from 11/15/21 to present)     Problem Noted Resolved   Supervision of normal pregnancy 11/15/2021 by [name], CNM No   Overview Addendum 04/30/2022 10:39 AM by Rocco Serene, LPN     Nursing Staff Provider  Office Location  [facility] Dating  EDD 8/29, CW L MP  Language  English Anatomy US  normal  Flu Vaccine   Genetic Screen  NIPS: negative. Does not want to know gender AFP neg  TDaP vaccine   04/17/22 Hgb A1C or  GTT Early : Third trimester : 95  Covid    LAB RESULTS   Rhogam   Blood Type O/Positive/-- (02/16 1426)   Feeding Plan Breast Antibody Negative (02/16 1426)  Contraception Pills Rubella <0.90 (02/16 1426)  Circumcision  RPR Non Reactive (02/16 1426)   Pediatrician  [facility] HBsAg Negative (02/16 1426)   Support Person  HIV Non Reactive (02/16 1426)  Prenatal Classes  Varicella     GBS  (For PCN allergy, check sensitivities)   BTL Consent     VBAC Consent  Pap  NILM    Hgb Electro      CF      SMA                    Term labor symptoms and general obstetric precautions including but not limited to vaginal bleeding, contractions, leaking of fluid and fetal movement were reviewed in detail with the patient. Please refer  to After Visit Summary for other counseling recommendations.  We discussed rechecking her next week and seeing if we can tease the cervix open.Also discussed the possiblity that she may need some "help" getting the cervix to open with the scarring there.  Return in about 1 week (around 06/28/2022) for return OB, consider IOL at 41 wks?[name], CNM   06/21/2022 5:32 PM

## 2022-06-21 NOTE — Progress Notes (Signed)
Cervical check today. No vb. No lof  

## 2022-06-29 ENCOUNTER — Ambulatory Visit (INDEPENDENT_AMBULATORY_CARE_PROVIDER_SITE_OTHER): Payer: BC Managed Care – PPO | Admitting: Advanced Practice Midwife

## 2022-06-29 VITALS — BP 130/90 | Wt 222.0 lb

## 2022-06-29 DIAGNOSIS — Z3403 Encounter for supervision of normal first pregnancy, third trimester: Secondary | ICD-10-CM

## 2022-06-29 DIAGNOSIS — Z3A40 40 weeks gestation of pregnancy: Secondary | ICD-10-CM

## 2022-06-30 ENCOUNTER — Inpatient Hospital Stay
Admission: EM | Admit: 2022-06-30 | Discharge: 2022-07-02 | DRG: 806 | Disposition: A | Discharge status: Home / Self Care | Payer: BC Managed Care – PPO | Attending: Obstetrics | Admitting: Obstetrics

## 2022-06-30 ENCOUNTER — Other Ambulatory Visit: Payer: Self-pay

## 2022-06-30 ENCOUNTER — Encounter: Payer: Self-pay | Admitting: Obstetrics and Gynecology

## 2022-06-30 ENCOUNTER — Inpatient Hospital Stay: Payer: BC Managed Care – PPO | Admitting: Anesthesiology

## 2022-06-30 DIAGNOSIS — O26893 Other specified pregnancy related conditions, third trimester: Secondary | ICD-10-CM | POA: Diagnosis not present

## 2022-06-30 DIAGNOSIS — R030 Elevated blood-pressure reading, without diagnosis of hypertension: Secondary | ICD-10-CM | POA: Diagnosis not present

## 2022-06-30 DIAGNOSIS — Z23 Encounter for immunization: Secondary | ICD-10-CM

## 2022-06-30 DIAGNOSIS — O99214 Obesity complicating childbirth: Secondary | ICD-10-CM | POA: Diagnosis not present

## 2022-06-30 DIAGNOSIS — O9081 Anemia of the puerperium: Secondary | ICD-10-CM | POA: Diagnosis not present

## 2022-06-30 DIAGNOSIS — O480 Post-term pregnancy: Principal | ICD-10-CM | POA: Diagnosis present

## 2022-06-30 DIAGNOSIS — Z3490 Encounter for supervision of normal pregnancy, unspecified, unspecified trimester: Secondary | ICD-10-CM

## 2022-06-30 DIAGNOSIS — Z3A40 40 weeks gestation of pregnancy: Secondary | ICD-10-CM

## 2022-06-30 DIAGNOSIS — D62 Acute posthemorrhagic anemia: Secondary | ICD-10-CM | POA: Diagnosis not present

## 2022-06-30 LAB — CBC
HCT: 36.8 % (ref 36.0–46.0)
Hemoglobin: 11.6 g/dL — ABNORMAL LOW (ref 12.0–15.0)
MCH: 24.2 pg — ABNORMAL LOW (ref 26.0–34.0)
MCHC: 31.5 g/dL (ref 30.0–36.0)
MCV: 76.8 fL — ABNORMAL LOW (ref 80.0–100.0)
Platelets: 249 10*3/uL (ref 150–400)
RBC: 4.79 MIL/uL (ref 3.87–5.11)
RDW: 16 % — ABNORMAL HIGH (ref 11.5–15.5)
WBC: 10 10*3/uL (ref 4.0–10.5)
nRBC: 0 % (ref 0.0–0.2)

## 2022-06-30 LAB — TYPE AND SCREEN
ABO/RH(D): O POS
Antibody Screen: NEGATIVE

## 2022-06-30 MED ORDER — SOD CITRATE-CITRIC ACID 500-334 MG/5ML PO SOLN: 30.0000 mL | ORAL | Status: DC | PRN

## 2022-06-30 MED ORDER — OXYTOCIN-SODIUM CHLORIDE 30-0.9 UT/500ML-% IV SOLN: 1.0000 m[IU]/min | INTRAVENOUS | Status: DC

## 2022-06-30 MED ORDER — EPHEDRINE 5 MG/ML INJ: 10.0000 mg | INTRAVENOUS | Status: DC | PRN

## 2022-06-30 MED ORDER — FENTANYL-BUPIVACAINE-NACL 0.5-0.125-0.9 MG/250ML-% EP SOLN
EPIDURAL | Status: AC
  Filled 2022-06-30: qty 250

## 2022-06-30 MED ORDER — OXYCODONE-ACETAMINOPHEN 5-325 MG PO TABS: 2.0000 | ORAL_TABLET | ORAL | Status: DC | PRN

## 2022-06-30 MED ORDER — MISOPROSTOL 50MCG HALF TABLET
50.0000 ug | ORAL_TABLET | Freq: Once | ORAL | Status: AC
  Administered 2022-06-30: 50 ug via ORAL
  Filled 2022-06-30: qty 1

## 2022-06-30 MED ORDER — LACTATED RINGERS IV SOLN
500.0000 mL | Freq: Once | INTRAVENOUS | Status: AC
  Administered 2022-06-30: 500 mL via INTRAVENOUS

## 2022-06-30 MED ORDER — PHENYLEPHRINE 80 MCG/ML (10ML) SYRINGE FOR IV PUSH (FOR BLOOD PRESSURE SUPPORT): 80.0000 ug | PREFILLED_SYRINGE | INTRAVENOUS | Status: DC | PRN

## 2022-06-30 MED ORDER — LACTATED RINGERS IV SOLN
500.0000 mL | INTRAVENOUS | Status: DC | PRN
  Administered 2022-06-30: 250 mL via INTRAVENOUS
  Administered 2022-06-30: 1000 mL via INTRAVENOUS

## 2022-06-30 MED ORDER — LIDOCAINE HCL (PF) 1 % IJ SOLN
INTRAMUSCULAR | Status: DC | PRN
  Administered 2022-06-30: 1 mL via SUBCUTANEOUS

## 2022-06-30 MED ORDER — SODIUM CHLORIDE 0.9 % IV SOLN
INTRAVENOUS | Status: DC | PRN
  Administered 2022-06-30 (×2): 5 mL via EPIDURAL

## 2022-06-30 MED ORDER — ONDANSETRON HCL 4 MG/2ML IJ SOLN
4.0000 mg | Freq: Four times a day (QID) | INTRAMUSCULAR | Status: DC | PRN
  Administered 2022-06-30: 4 mg via INTRAVENOUS
  Filled 2022-06-30: qty 2

## 2022-06-30 MED ORDER — LIDOCAINE-EPINEPHRINE (PF) 1.5 %-1:200000 IJ SOLN
INTRAMUSCULAR | Status: DC | PRN
  Administered 2022-06-30: 3 mL via EPIDURAL

## 2022-06-30 MED ORDER — LIDOCAINE HCL (PF) 1 % IJ SOLN
30.0000 mL | INTRAMUSCULAR | Status: AC | PRN
  Administered 2022-07-01: 30 mL via SUBCUTANEOUS

## 2022-06-30 MED ORDER — FENTANYL-BUPIVACAINE-NACL 0.5-0.125-0.9 MG/250ML-% EP SOLN: 12.0000 mL/h | EPIDURAL | Status: DC | PRN

## 2022-06-30 MED ORDER — OXYTOCIN-SODIUM CHLORIDE 30-0.9 UT/500ML-% IV SOLN
2.5000 [IU]/h | INTRAVENOUS | Status: DC
  Administered 2022-07-01: 2.5 [IU]/h via INTRAVENOUS

## 2022-06-30 MED ORDER — OXYCODONE-ACETAMINOPHEN 5-325 MG PO TABS: 1.0000 | ORAL_TABLET | ORAL | Status: DC | PRN

## 2022-06-30 MED ORDER — ACETAMINOPHEN 325 MG PO TABS: 650.0000 mg | ORAL_TABLET | ORAL | Status: DC | PRN

## 2022-06-30 MED ORDER — MISOPROSTOL 50MCG HALF TABLET
50.0000 ug | ORAL_TABLET | Freq: Once | ORAL | Status: AC
  Administered 2022-06-30: 50 ug via VAGINAL
  Filled 2022-06-30: qty 1

## 2022-06-30 MED ORDER — FENTANYL CITRATE (PF) 100 MCG/2ML IJ SOLN
50.0000 ug | INTRAMUSCULAR | Status: DC | PRN
  Administered 2022-06-30: 50 ug via INTRAVENOUS
  Administered 2022-06-30: 100 ug via INTRAVENOUS
  Filled 2022-06-30 (×3): qty 2

## 2022-06-30 MED ORDER — FENTANYL-BUPIVACAINE-NACL 0.5-0.125-0.9 MG/250ML-% EP SOLN
EPIDURAL | Status: DC | PRN
  Administered 2022-06-30: 12 mL/h via EPIDURAL

## 2022-06-30 MED ORDER — LACTATED RINGERS IV SOLN: INTRAVENOUS | Status: DC

## 2022-06-30 MED ORDER — TERBUTALINE SULFATE 1 MG/ML IJ SOLN
0.2500 mg | Freq: Once | INTRAMUSCULAR | Status: AC | PRN
  Administered 2022-06-30: 0.25 mg via SUBCUTANEOUS
  Filled 2022-06-30: qty 1

## 2022-06-30 MED ORDER — DIPHENHYDRAMINE HCL 50 MG/ML IJ SOLN: 12.5000 mg | INTRAMUSCULAR | Status: DC | PRN

## 2022-06-30 MED ORDER — OXYTOCIN BOLUS FROM INFUSION
333.0000 mL | Freq: Once | INTRAVENOUS | Status: AC
  Administered 2022-06-30: 333 mL via INTRAVENOUS

## 2022-06-30 NOTE — Anesthesia Preprocedure Evaluation (Signed)
Anesthesia Evaluation  Patient identified by MRN, date of birth, ID band Patient awake    Reviewed: Allergy & Precautions, NPO status , Patient's Chart, lab work & pertinent test results  Airway Mallampati: II  TM Distance: >3 FB Neck ROM: full    Dental no notable dental hx.    Pulmonary neg pulmonary ROS,    Pulmonary exam normal        Cardiovascular Exercise Tolerance: Good negative cardio ROS Normal cardiovascular exam     Neuro/Psych    GI/Hepatic negative GI ROS,   Endo/Other    Renal/GU   negative genitourinary   Musculoskeletal   Abdominal (+) + obese,   Peds  Hematology negative hematology ROS (+)   Anesthesia Other Findings Past Medical History: No date: Anemia No date: Bacterial vaginosis 06/09/2015: Chronic pelvic pain in female No date: Dyspareunia in female No date: Headache No date: Ovarian cyst  Past Surgical History: 06/09/2015: LAPAROSCOPY; N/A     Comment:  Procedure: LAPAROSCOPY DIAGNOSTIC;  Surgeon: Conard Novak, MD;  Location: [facility] ORS;  Service: Gynecology;                Laterality: N/A; No date: WISDOM TOOTH EXTRACTION     Reproductive/Obstetrics (+) Pregnancy                             Anesthesia Physical Anesthesia Plan  ASA: 2  Anesthesia Plan: Epidural   Post-op Pain Management:    Induction:   PONV Risk Score and Plan:   Airway Management Planned:   Additional Equipment:   Intra-op Plan:   Post-operative Plan:   Informed Consent: I have reviewed the patients History and Physical, chart, labs and discussed the procedure including the risks, benefits and alternatives for the proposed anesthesia with the patient or authorized representative who has indicated his/her understanding and acceptance.       Plan Discussed with: Anesthesiologist  Anesthesia Plan Comments:         Anesthesia Quick Evaluation

## 2022-06-30 NOTE — H&P (Signed)
Kristen Galloway is a 26 y.o. female presenting for induction of labor at 40 weeks 4 days gestation..her husband is with her  and supportive.She reports that her cervix was closed in the office yesterday, and the CNM there attempted to use a cervical dilator to help break up some scar tissue that has been palpated during cervical exams. She has been having some mild contractions, and her baby is moving well. She denies any leaking of fluid. Her GBS status is negative. OB History     Gravida  1   Para  0   Term  0   Preterm  0   AB  0   Living  0      SAB  0   IAB  0   Ectopic  0   Multiple  0   Live Births  0          Past Medical History:  Diagnosis Date   Anemia    Bacterial vaginosis    Chronic pelvic pain in female 06/09/2015   Dyspareunia in female    Headache    Ovarian cyst    Past Surgical History:  Procedure Laterality Date   LAPAROSCOPY N/A 06/09/2015   Procedure: LAPAROSCOPY DIAGNOSTIC;  Surgeon: Conard Novak, MD;  Location: [facility] ORS;  Service: Gynecology;  Laterality: N/A;   WISDOM TOOTH EXTRACTION     Family History: family history includes Colon cancer in her paternal grandmother; Diabetes in her maternal grandfather and paternal grandfather; Endometriosis in her maternal grandmother and mother; Heart disease in her paternal grandfather; Melanoma in her maternal grandmother. Social History:  reports that she has never smoked. She has never used smokeless tobacco. She reports that she does not currently use alcohol. She reports that she does not use drugs.     Maternal Diabetes: No Genetic Screening: Declined Maternal Ultrasounds/Referrals: Normal Fetal Ultrasounds or other Referrals:  None Maternal Substance Abuse:  No Significant Maternal Medications:  None Significant Maternal Lab Results:  Group B Strep negative Number of Prenatal Visits:greater than 3 verified prenatal visits Other Comments:   History  Review of Systems   Constitutional: Negative.   HENT: Negative.    Respiratory: Negative.    Cardiovascular: Negative.   Gastrointestinal: Negative.   Endocrine: Negative.   Genitourinary: Negative.   Musculoskeletal: Negative.   Allergic/Immunologic: Negative.   Neurological: Negative.   Hematological: Negative.   Psychiatric/Behavioral: Negative.     History Dilation: Closed Effacement (%): 50 Station: -3 Exam by:: Paula Compton CNM Last menstrual period 09/19/2021. Maternal Exam:  Introitus: Normal vulva.   Physical Exam Vitals reviewed.  Constitutional:      Appearance: Normal appearance. She is obese.  HENT:     Head: Normocephalic and atraumatic.  Cardiovascular:     Rate and Rhythm: Normal rate and regular rhythm.     Pulses: Normal pulses.     Heart sounds: Normal heart sounds.  Pulmonary:     Effort: Pulmonary effort is normal.     Breath sounds: Normal breath sounds.  Abdominal:     Comments: Gravid abdomen  Genitourinary:    General: Normal vulva.     Rectum: Normal.     Comments: SVE: closed/50%/-3. Cervix is posterior. Bishop score is 2 Musculoskeletal:        General: Normal range of motion.     Cervical back: Normal range of motion and neck supple.  Skin:    General: Skin is warm and dry.  Neurological:  General: No focal deficit present.     Mental Status: She is alert and oriented to person, place, and time.  Psychiatric:        Mood and Affect: Mood normal.        Behavior: Behavior normal.     Prenatal labs: ABO, Rh: O/Positive/-- (02/16 1426) Antibody: Negative (02/16 1426) Rubella: <0.90 (02/16 1426) RPR: Non Reactive (06/07 1535)  HBsAg: Negative (02/16 1426)  HIV: Non Reactive (06/07 1535)  GBS: Negative/-- (07/31 1018)   NST: 125 baseline, moderate variability, accels present, decels absent  Assessment/Plan: IUP 40 weeks 4 days gestation for induction of labor Category 1 FHTS Unfavorable cervix with Hx of LEEP procedure- cervical  scarring Bishop score is 2  Will commence with Cytotec. 50 mcg placed vaginally and 50 mcg buccal. EFM continuous Recheck in 4 hours. Discussed the IOL process that may include multiple doses of Cytotec, placement of foley ball, use of cervical dilators if necessary, and pitocin.   [name], CNM  06/30/2022 3:26 PM    Claris Che Liana Crocker 06/30/2022, 3:10 PM

## 2022-06-30 NOTE — Anesthesia Procedure Notes (Signed)
Epidural Patient location during procedure: OB Start time: 06/30/2022 9:31 PM End time: 06/30/2022 9:54 PM  Staffing Anesthesiologist: Foye Deer, MD Performed: anesthesiologist   Preanesthetic Checklist Completed: patient identified, IV checked, site marked, risks and benefits discussed, surgical consent, monitors and equipment checked, pre-op evaluation and timeout performed  Epidural Patient position: sitting Prep: ChloraPrep Patient monitoring: heart rate, continuous pulse ox and blood pressure Approach: midline Location: L3-L4 Injection technique: LOR saline  Needle:  Needle type: Tuohy  Needle gauge: 18 G Needle length: 9 cm Needle insertion depth: 7 cm Catheter type: closed end Catheter size: 20 Guage Catheter at skin depth: 11 cm Test dose: negative and 1.5% lidocaine with Epi 1:200 K  Assessment Events: blood not aspirated, injection not painful, no injection resistance and no paresthesia  Additional Notes Reason for block:procedure for pain

## 2022-06-30 NOTE — Progress Notes (Signed)
Kristen Galloway is a 26 y.o. G1P0000 at [redacted]w[redacted]d by ultrasound admitted for induction of labor due to Post dates. Due date 06/26/2022 she was dosed earlier with cytotec, and has , over the lat hour os so, become very uncomfortable. Her cervix has been noted for scarring and resistance. She is interested in IV pain medication. Subjective: More uncomfortable over the last hour or so.   Objective: BP (!) 147/68   Pulse 97   Temp 97.7 F (36.5 C) (Oral)   Resp 18   LMP 09/19/2021 (Exact Date)   SpO2 98%  No intake/output data recorded. No intake/output data recorded.  FHT:  FHR: 125 bpm, variability: moderate,  accelerations:  Present,  decelerations:  Present some no repetitive variables have been noted.  It has been difficult to trace her FHTs due to her  body habitus UC:   regular, every 1.5 minutes, tachysystole pattern noted.  SVE:   7cms/100%/-2  Labs: Lab Results  Component Value Date   WBC 10.0 06/30/2022   HGB 11.6 (L) 06/30/2022   HCT 36.8 06/30/2022   MCV 76.8 (L) 06/30/2022   PLT 249 06/30/2022    Assessment / Plan: Induction of labor due to postterm,  progressing well on pitocin  Labor:  has made dramatic  progress Fetal Wellbeing:  Category II Pain Control:  IV pain meds I/D:  n/a Anticipated MOD:  NSVD  [name], CNM 06/30/2022, 10:59 PM

## 2022-07-01 ENCOUNTER — Encounter: Payer: Self-pay | Admitting: Obstetrics and Gynecology

## 2022-07-01 DIAGNOSIS — O480 Post-term pregnancy: Secondary | ICD-10-CM | POA: Diagnosis not present

## 2022-07-01 DIAGNOSIS — Z3A40 40 weeks gestation of pregnancy: Secondary | ICD-10-CM

## 2022-07-01 LAB — PROTEIN / CREATININE RATIO, URINE
Creatinine, Urine: 39 mg/dL
Protein Creatinine Ratio: 0.23 mg/mg{Cre} — ABNORMAL HIGH (ref 0.00–0.15)
Total Protein, Urine: 9 mg/dL

## 2022-07-01 LAB — COMPREHENSIVE METABOLIC PANEL
ALT: 11 U/L (ref 0–44)
AST: 26 U/L (ref 15–41)
Albumin: 2.7 g/dL — ABNORMAL LOW (ref 3.5–5.0)
Alkaline Phosphatase: 155 U/L — ABNORMAL HIGH (ref 38–126)
Anion gap: 9 (ref 5–15)
BUN: 10 mg/dL (ref 6–20)
CO2: 19 mmol/L — ABNORMAL LOW (ref 22–32)
Calcium: 8.8 mg/dL — ABNORMAL LOW (ref 8.9–10.3)
Chloride: 106 mmol/L (ref 98–111)
Creatinine, Ser: 0.61 mg/dL (ref 0.44–1.00)
GFR, Estimated: >60 mL/min (ref >60)
Glucose, Bld: 104 mg/dL — ABNORMAL HIGH (ref 70–99)
Potassium: 4.1 mmol/L (ref 3.5–5.1)
Sodium: 134 mmol/L — ABNORMAL LOW (ref 135–145)
Total Bilirubin: 0.6 mg/dL (ref 0.3–1.2)
Total Protein: 5.9 g/dL — ABNORMAL LOW (ref 6.5–8.1)

## 2022-07-01 LAB — CBC WITH DIFFERENTIAL/PLATELET
Abs Immature Granulocytes: 0.1 10*3/uL — ABNORMAL HIGH (ref 0.00–0.07)
Basophils Absolute: 0 10*3/uL (ref 0.0–0.1)
Basophils Relative: 0 %
Eosinophils Absolute: 0 10*3/uL (ref 0.0–0.5)
Eosinophils Relative: 0 %
HCT: 34.2 % — ABNORMAL LOW (ref 36.0–46.0)
Hemoglobin: 10.8 g/dL — ABNORMAL LOW (ref 12.0–15.0)
Immature Granulocytes: 1 %
Lymphocytes Relative: 5 %
Lymphs Abs: 1.1 10*3/uL (ref 0.7–4.0)
MCH: 24.4 pg — ABNORMAL LOW (ref 26.0–34.0)
MCHC: 31.6 g/dL (ref 30.0–36.0)
MCV: 77.2 fL — ABNORMAL LOW (ref 80.0–100.0)
Monocytes Absolute: 1.5 10*3/uL — ABNORMAL HIGH (ref 0.1–1.0)
Monocytes Relative: 7 %
Neutro Abs: 17.4 10*3/uL — ABNORMAL HIGH (ref 1.7–7.7)
Neutrophils Relative %: 87 %
Platelets: 272 10*3/uL (ref 150–400)
RBC: 4.43 MIL/uL (ref 3.87–5.11)
RDW: 16 % — ABNORMAL HIGH (ref 11.5–15.5)
WBC: 20.1 10*3/uL — ABNORMAL HIGH (ref 4.0–10.5)
nRBC: 0 % (ref 0.0–0.2)

## 2022-07-01 LAB — RPR: RPR Ser Ql: NONREACTIVE

## 2022-07-01 MED ORDER — ACETAMINOPHEN 325 MG PO TABS
650.0000 mg | ORAL_TABLET | ORAL | Status: DC | PRN
  Administered 2022-07-01: 650 mg via ORAL
  Filled 2022-07-01: qty 2

## 2022-07-01 MED ORDER — DIPHENHYDRAMINE HCL 25 MG PO CAPS: 25.0000 mg | ORAL_CAPSULE | Freq: Four times a day (QID) | ORAL | Status: DC | PRN

## 2022-07-01 MED ORDER — ONDANSETRON HCL 4 MG PO TABS: 4.0000 mg | ORAL_TABLET | ORAL | Status: DC | PRN

## 2022-07-01 MED ORDER — LABETALOL HCL 5 MG/ML IV SOLN: 80.0000 mg | INTRAVENOUS | Status: DC | PRN

## 2022-07-01 MED ORDER — LABETALOL HCL 5 MG/ML IV SOLN: 20.0000 mg | INTRAVENOUS | Status: DC | PRN

## 2022-07-01 MED ORDER — WITCH HAZEL-GLYCERIN EX PADS
1.0000 | MEDICATED_PAD | CUTANEOUS | Status: DC | PRN
  Administered 2022-07-01 – 2022-07-02 (×2): 1 via TOPICAL
  Filled 2022-07-01 (×2): qty 100

## 2022-07-01 MED ORDER — PRENATAL MULTIVITAMIN CH
1.0000 | ORAL_TABLET | Freq: Every day | ORAL | Status: DC
  Administered 2022-07-01 – 2022-07-02 (×2): 1 via ORAL
  Filled 2022-07-01 (×2): qty 1

## 2022-07-01 MED ORDER — ONDANSETRON HCL 4 MG/2ML IJ SOLN: 4.0000 mg | INTRAMUSCULAR | Status: DC | PRN

## 2022-07-01 MED ORDER — BENZOCAINE-MENTHOL 20-0.5 % EX AERO
1.0000 | INHALATION_SPRAY | CUTANEOUS | Status: DC | PRN
  Administered 2022-07-01 – 2022-07-02 (×2): 1 via TOPICAL
  Filled 2022-07-01 (×2): qty 56

## 2022-07-01 MED ORDER — ZOLPIDEM TARTRATE 5 MG PO TABS: 5.0000 mg | ORAL_TABLET | Freq: Every evening | ORAL | Status: DC | PRN

## 2022-07-01 MED ORDER — IBUPROFEN 600 MG PO TABS
600.0000 mg | ORAL_TABLET | Freq: Four times a day (QID) | ORAL | Status: DC
  Administered 2022-07-01 – 2022-07-02 (×6): 600 mg via ORAL
  Filled 2022-07-01 (×6): qty 1

## 2022-07-01 MED ORDER — COCONUT OIL OIL: 1.0000 | TOPICAL_OIL | Status: DC | PRN

## 2022-07-01 MED ORDER — OXYCODONE HCL 5 MG PO TABS: 10.0000 mg | ORAL_TABLET | ORAL | Status: DC | PRN

## 2022-07-01 MED ORDER — HYDRALAZINE HCL 20 MG/ML IJ SOLN: 10.0000 mg | INTRAMUSCULAR | Status: DC | PRN

## 2022-07-01 MED ORDER — DIBUCAINE (PERIANAL) 1 % EX OINT: 1.0000 | TOPICAL_OINTMENT | CUTANEOUS | Status: DC | PRN

## 2022-07-01 MED ORDER — DOCUSATE SODIUM 100 MG PO CAPS
100.0000 mg | ORAL_CAPSULE | Freq: Two times a day (BID) | ORAL | Status: DC
  Administered 2022-07-02: 100 mg via ORAL
  Filled 2022-07-01: qty 1

## 2022-07-01 MED ORDER — OXYCODONE HCL 5 MG PO TABS: 5.0000 mg | ORAL_TABLET | ORAL | Status: DC | PRN

## 2022-07-01 MED ORDER — LABETALOL HCL 5 MG/ML IV SOLN: 40.0000 mg | INTRAVENOUS | Status: DC | PRN

## 2022-07-01 MED ORDER — SIMETHICONE 80 MG PO CHEW: 80.0000 mg | CHEWABLE_TABLET | ORAL | Status: DC | PRN

## 2022-07-01 NOTE — Progress Notes (Signed)
Kristen Galloway is a 26 y.o. G1P0000 at [redacted]w[redacted]d by  admitted for induction earlier this afternoon, who has made dramatic progress over the last few hours, once her cervical stenosis was "released".  Subjective: Feeling urge to bear down. Very uncomfortable. Has an epidural in place. It makes her very jittery.   Objective: BP (!) 154/75   Pulse (!) 115   Temp 97.6 F (36.4 C) (Oral)   Resp 18   LMP 09/19/2021 (Exact Date)   SpO2 97%  No intake/output data recorded. Total I/O In: -  Out: 475 [Urine:275; Blood:200]  FHT:  FHR: 110s, bpm, variability: moderate,  accelerations:  Present,  decelerations:  Present she has had repetetive decels with contractions, some are lates, others appear to be early decels. Difficulty tracing the FHTS, and 2x attempt to place FSE have been unsuccessful.body habitus is a factor here UC:   regular, every 1.5-2 minutes, firm. Treated with Terbutaline for tachysystole and IV bolus. SVE:   Dilation: 10 Effacement (%): 100 Station: 0 Exam by:: Kristen Galloway, CNM  Labs: Lab Results  Component Value Date   WBC 10.0 06/30/2022   HGB 11.6 (L) 06/30/2022   HCT 36.8 06/30/2022   MCV 76.8 (L) 06/30/2022   PLT 249 06/30/2022    Assessment / Plan: Induction of labor due to postterm,  progressing well on pitocin  Labor:  Rapid dilation, now complete , from 2 cms to 10 in a few hours. Second stage. Fetal Wellbeing:  Category II Pain Control:  Epidural I/D:  n/a Anticipated MOD:  NSVD Dr. [name] has been contacted by phone due to the repetitive decel pattern. The overall variability is moderate.Kristen Galloway invited to stay on the unit for delivery assistance , if needed.  Kristen Galloway, CNM 07/01/2022, 12:37 AM

## 2022-07-01 NOTE — Discharge Summary (Signed)
Postpartum Discharge Summary  Date of Service updated 06/30/2022     Patient Name: Kristen Galloway DOB: [birth year] MRN: [id number]  Date of admission: 06/30/2022 Delivery date:06/30/2022  Delivering provider: Imagene Riches  Date of discharge: 07/02/2022  Admitting diagnosis: Encounter for induction of labor [Z34.90] Intrauterine pregnancy: [redacted]w[redacted]d    Secondary diagnosis:  Principal Problem:   Encounter for induction of labor Active Problems:   Postpartum care following vaginal delivery   Encounter for care or examination of lactating mother  Additional problems: elevated pressures during labor    Discharge diagnosis: Term Pregnancy Delivered                                              Post partum procedures: none Augmentation: AROM and Cytotec Complications: None  Hospital course: Induction of Labor With Vaginal Delivery   26y.o. yo G1P1001 at 497w5das admitted to the hospital 06/30/2022 for induction of labor.  Indication for induction: Postdates.  Patient had an uncomplicated labor course as follows: Membrane Rupture Time/Date: 8:01 PM ,06/30/2022   Delivery Method:Vaginal, Spontaneous  Episiotomy:   Lacerations:  2nd degree  Details of delivery can be found in separate delivery note.    Patient had a routine postpartum course. She is tolerating regular diet, her pain is controlled with PO medication, she is ambulating and voiding without difficulty. [facility] is discharged home 07/02/22.  Newborn Data: Birth date:06/30/2022  Birth time:11:50 PM  Gender:Female  Living status:Living  Apgars:9 ,10  Weight:3190 g   Magnesium Sulfate received: No BMZ received: No Rhophylac:N/A MMR:No T-DaP:Given prenatally Flu: No Transfusion:No  Physical exam  Vitals:   07/01/22 1519 07/01/22 1911 07/01/22 2338 07/02/22 0838  BP: 120/60 131/72 121/69 (!) 106/52  Pulse: 98 93 (!) 101 82  Resp: _0 Temp: 97.8  F (36.6 C) 97.8 F (36.6 C) 98 F (36.7 C) 98.1 F (36.7 C)  TempSrc: Oral Oral Oral Oral  SpO2:  98% 96% 98%  Weight:      Height:       General: alert, cooperative, and no distress Lochia: appropriate Uterine Fundus: firm Incision: N/A DVT Evaluation: No evidence of DVT seen on physical exam. Labs: Lab Results  Component Value Date   WBC 20.1 (H) 07/01/2022   HGB 10.8 (L) 07/01/2022   HCT 34.2 (L) 07/01/2022   MCV 77.2 (L) 07/01/2022   PLT 272 07/01/2022      Latest Ref Rng & Units 07/01/2022    1:48 AM  CMP  Glucose 70 - 99 mg/dL 104   BUN 6 - 20 mg/dL 10   Creatinine 0.44 - 1.00 mg/dL 0.61   Sodium 135 - 145 mmol/L 134   Potassium 3.5 - 5.1 mmol/L 4.1   Chloride 98 - 111 mmol/L 106   CO2 22 - 32 mmol/L 19   Calcium 8.9 - 10.3 mg/dL 8.8   Total Protein 6.5 - 8.1 g/dL 5.9   Total Bilirubin 0.3 - 1.2 mg/dL 0.6   Alkaline Phos 38 - 126 U/L 155   AST 15 - 41 U/L 26   ALT 0 - 44 U/L 11    Edinburgh Score:    07/01/2022   12:01 PM  Edinburgh Postnatal Depression Scale Screening Tool  I have been able to laugh and  see the funny side of things. 0  I have looked forward with enjoyment to things. 0  I have blamed myself unnecessarily when things went wrong. 0  I have been anxious or worried for no good reason. 1  I have felt scared or panicky for no good reason. 0  Things have been getting on top of me. 0  I have been so unhappy that I have had difficulty sleeping. 0  I have felt sad or miserable. 0  I have been so unhappy that I have been crying. 0  The thought of harming myself has occurred to me. 0  Edinburgh Postnatal Depression Scale Total 1      After visit meds:  Allergies as of 07/02/2022       Reactions   Cefzil [cefprozil] Anaphylaxis   Prednisone Nausea Only        Medication List     TAKE these medications    Magnesium 400 MG Tabs   PRENATAL 1 PO Take by mouth.         Discharge home in stable condition Infant Feeding:  Breast Infant Disposition:home with mother Discharge instruction: per After Visit Summary and Postpartum booklet. Activity: Advance as tolerated. Pelvic rest for 6 weeks.  Diet: routine diet Anticipated Birth Control: POPs Postpartum Appointment:6 weeks Additional Postpartum F/U: Postpartum Depression checkup Future Appointments:No future appointments.  Follow up Visit:  Follow-up Information     Imagene Riches, CNM Follow up in 2 week(s).   Specialties: Obstetrics, Gynecology Why: Please call the [place] office and make appointment for 2 weeks post delivery and again at 6 weeks post delivery for a physical.If you plan on getting Nexplanon or an IUD, let the schedulers know. Contact information: [address] [place] [phone]                  07/02/2022 Rod Can, CNM

## 2022-07-01 NOTE — Progress Notes (Signed)
Obstetric Postpartum Daily Progress Note Subjective:  26 y.o. G1P1001 postpartum  #15 hours status post vaginal delivery.  She is ambulating, is tolerating po, is voiding spontaneously.  Her pain is well controlled on PO pain medications. Her lochia is less than menses. Breastfeeding is a work in progress, infant has latched for a few sucks and has been sleepy.  Pt's mother and partner present. Mood is good.    Medications SCHEDULED MEDICATIONS   [START ON 07/02/2022] docusate sodium  100 mg Oral BID   ibuprofen  600 mg Oral Q6H   prenatal multivitamin  1 tablet Oral Q1200    MEDICATION INFUSIONS    PRN MEDICATIONS  acetaminophen, benzocaine-Menthol, coconut oil, witch hazel-glycerin **AND** dibucaine, diphenhydrAMINE, ondansetron **OR** ondansetron (ZOFRAN) IV, oxyCODONE, oxyCODONE, simethicone, zolpidem    Objective:   Vitals:   07/01/22 0442 07/01/22 0801 07/01/22 0915 07/01/22 1201  BP: 135/70 119/72 114/66 130/71  Pulse: (!) 121 99 97 90  Resp:  16 18 18   Temp:  98.7 F (37.1 C) 98 F (36.7 C) 97.6 F (36.4 C)  TempSrc:  Oral    SpO2:   98%   Weight:      Height:        Current Vital Signs 24h Vital Sign Ranges  T 97.6 F (36.4 C) Temp  Avg: 98 F (36.7 C)  Min: 97.6 F (36.4 C)  Max: 98.7 F (37.1 C)  BP 130/71 BP  Min: 104/81  Max: 183/160  HR 90 Pulse  Avg: 106.2  Min: 90  Max: 140  RR 18 Resp  Avg: 17.5  Min: 16  Max: 18  SaO2 98 %   SpO2  Avg: 98.1 %  Min: 97 %  Max: 100 %       24 Hour I/O Current Shift I/O  Time Ins Outs 09/02 0701 - 09/03 0700 In: 360 [P.O.:360] Out: 975 [Urine:625] No intake/output data recorded.  General: NAD Breasts: no redness or masses, nipples flat, intact bilaterally  Pulmonary: no increased work of breathing Abdomen: non-distended, non-tender, fundus firm at level of umbilicus Perineum: Well approximated, swollen  Extremities: trace edema, no erythema, no tenderness  Labs:  Recent Labs  Lab 06/30/22 1513 07/01/22 0148   WBC 10.0 20.1*  HGB 11.6* 10.8*  HCT 36.8 34.2*  PLT 249 272     Assessment:   26 y.o. G1P1001 postpartum 15 hours status post SVB  Plan:   1) Acute blood loss anemia - hemodynamically stable and asymptomatic - po ferrous sulfate  2) O POS / Rubella <0.90 (02/16 1426)/ Varicella Not immune  3) TDAP status up to date  4) breast /Contraception = oral progesterone-only contraceptive  5) Disposition discharge 07/02/22  09/01/22 [facility], CNM  07/01/2022 2:33 PM

## 2022-07-01 NOTE — Anesthesia Postprocedure Evaluation (Signed)
Anesthesia Post Note  Patient: Carlye Panameno  Procedure(s) Performed: AN AD HOC LABOR EPIDURAL  Patient location during evaluation: PACU Anesthesia Type: Epidural Level of consciousness: awake and alert Pain management: pain level controlled Vital Signs Assessment: post-procedure vital signs reviewed and stable Respiratory status: spontaneous breathing, nonlabored ventilation and respiratory function stable Cardiovascular status: blood pressure returned to baseline and stable Postop Assessment: no apparent nausea or vomiting Anesthetic complications: no   No notable events documented.   Last Vitals:  Vitals:   07/01/22 0442 07/01/22 0915  BP: 135/70 114/66  Pulse: (!) 121 97  Resp:  18  Temp:  36.7 C  SpO2:  98%    Last Pain:  Vitals:   07/01/22 0915  TempSrc:   PainSc: 0-No pain                 Foye Deer

## 2022-07-02 ENCOUNTER — Encounter: Payer: Self-pay | Admitting: Advanced Practice Midwife

## 2022-07-02 MED ORDER — VARICELLA VIRUS VACCINE LIVE 1350 PFU/0.5ML ~~LOC~~ INJ
0.5000 mL | INJECTION | Freq: Once | SUBCUTANEOUS | Status: AC
Start: 2022-07-02 — End: 2022-07-02
  Administered 2022-07-02: 0.5 mL via SUBCUTANEOUS
  Filled 2022-07-02: qty 0.5

## 2022-07-02 MED ORDER — MEASLES, MUMPS & RUBELLA VAC IJ SOLR
0.5000 mL | Freq: Once | INTRAMUSCULAR | Status: AC
  Administered 2022-07-02: 0.5 mL via SUBCUTANEOUS
  Filled 2022-07-02: qty 0.5

## 2022-07-02 NOTE — Lactation Note (Signed)
This note was copied from a baby's chart. Lactation Consultation Note  Patient Name: Kristen Galloway PIRJJ'O Date: 07/02/2022 Reason for consult: Initial assessment;Primapara;Term;RN request;Breastfeeding assistance;Mother's request Age:28 hours  Maternal Data This is mom's first baby, NSVD. Mom with history of anemia. Mom's goal is to breastfeed. Per mom's report she had been trying to improve baby's latch as the latch appears shallow. Mom reports baby has more of a shallow latch at the right breast than the left. Upon entering the room LC noted mom had independently positioned and latched baby. Mom reports this is the best he has done. Has patient been taught Hand Expression?: Yes Does the patient have breastfeeding experience prior to this delivery?: No  Feeding Mother's Current Feeding Choice: Breast Milk Provided mom with tips and strategies to maximize latch technique. Baby with multiple audible swallows during this feed. Mom readily recognized the swallows.  LATCH Score Latch: Grasps breast easily, tongue down, lips flanged, rhythmical sucking. (At the right breast baby initially had the bottom lip rolled in. Mom instructed on how to roll out bottom lip if lip in not flanged outward.)  Audible Swallowing: Spontaneous and intermittent  Type of Nipple: Everted at rest and after stimulation  Comfort (Breast/Nipple): Soft / non-tender  Hold (Positioning): No assistance needed to correctly position infant at breast.  LATCH Score: 10  Interventions Interventions: Breast feeding basics reviewed;Assisted with latch;Breast massage;Adjust position;Support pillows;Education  Discharge Discharge Education: Engorgement and breast care;Warning signs for feeding baby (Mom reports baby will follow-up at [facility]. Mom is aware of outpatient lactation assistance at the practice.She is aware she can also call [facility] lactation if she has any additional questions once she goes  home.) Pump: Personal For the feeding LC observed baby breastfed well. As baby had some inconsistent feeds LC reviewed feeding plan if baby at a feeding baby will not latch and feed. Mom understands she can use her electric pump and/or do hand expression and baby could be offered any pumped milk and/or a formula supplement.Parents in agreement with plan.  Consult Status Consult Status: Complete  Update provided to care nurse.  Fuller Song 07/02/2022, 11:02 AM

## 2022-07-02 NOTE — Progress Notes (Signed)
Routine Prenatal Care Visit  Subjective  Kristen Galloway is a 26 y.o. G1P1001 at [redacted]w[redacted]d being seen today for ongoing prenatal care.  She is currently monitored for the following issues for this low-risk pregnancy and has Chronic pelvic pain in female; Endometriosis; Migraines; Anemia; Avitaminosis D; Enlarged uterus; Ruptured ovarian cyst; Family history of colon cancer requiring screening colonoscopy; Menorrhagia with regular cycle; Dysmenorrhea; Low grade squamous intraepith lesion on cytologic smear cervix (lgsil); Dysplasia of cervix, high grade CIN 2; Supervision of normal pregnancy; Back pain affecting pregnancy in second trimester; Susceptible to varicella (non-immune), currently pregnant; Rubella non-immune status, antepartum; Indication for care in labor and delivery, antepartum; Encounter for induction of labor; Postpartum care following vaginal delivery; and Encounter for care or examination of lactating mother on their problem list.  ----------------------------------------------------------------------------------- Patient reports no complaints.  Discussed iol as she is nearing 41 weeks.  Contractions: Irregular. Vag. Bleeding: None.  Movement: Present. Leaking Fluid denies.  ----------------------------------------------------------------------------------- The following portions of the patient's history were reviewed and updated as appropriate: allergies, current medications, past family history, past medical history, past social history, past surgical history and problem list. Problem list updated.  Objective  Blood pressure (!) 130/90, weight 222 lb (100.7 kg), last menstrual period 09/19/2021 Pregravid weight 182 lb (82.6 kg) Total Weight Gain 40 lb (18.1 kg) Urinalysis: Urine Protein    Urine Glucose    Fetal Status: Fetal Heart Rate (bpm): 138 Fundal Height: 42 cm Movement: Present     General:  Alert, oriented and cooperative. Patient is in no acute distress.  Skin:  Skin is warm and dry. No rash noted.   Cardiovascular: Normal heart rate noted  Respiratory: Normal respiratory effort, no problems with respiration noted  Abdomen: Soft, gravid, appropriate for gestational age. Pain/Pressure: Present     Pelvic:  Cervical exam performed Dilation: Closed Effacement (%): 70 Station: -2, unable to break cervical scar tissue, small dilator under speculum guidance seemed to go through cervix  Extremities: Normal range of motion.  Edema: None  Mental Status: Normal mood and affect. Normal behavior. Normal judgment and thought content.   Assessment   26 y.o. G1P1001 at [redacted]w[redacted]d by  06/26/2022, by Last Menstrual Period presenting for routine prenatal visit  Plan   pregnancy Problems (from 11/15/21 to 07/02/22)    Problem Noted Resolved   Encounter for care or examination of lactating mother 07/02/2022 by Tresea Mall, CNM No   Supervision of normal pregnancy 11/15/2021 by [name], CNM No   Overview Addendum 04/30/2022 10:39 AM by Rocco Serene, LPN     Nursing Staff Provider  Office Location  [facility] Dating  EDD 8/29, CW L MP  Language  English Anatomy US  normal  Flu Vaccine   Genetic Screen  NIPS: negative. Does not want to know gender AFP neg  TDaP vaccine   04/17/22 Hgb A1C or  GTT Early : Third trimester : 95  Covid    LAB RESULTS   Rhogam   Blood Type O/Positive/-- (02/16 1426)   Feeding Plan Breast Antibody Negative (02/16 1426)  Contraception Pills Rubella <0.90 (02/16 1426)  Circumcision  RPR Non Reactive (02/16 1426)   Pediatrician  [facility] HBsAg Negative (02/16 1426)   Support Person  HIV Non Reactive (02/16 1426)  Prenatal Classes  Varicella     GBS  (For PCN allergy, check sensitivities)   BTL Consent     VBAC Consent  Pap  NILM    Hgb Electro  CF      SMA                   Term labor symptoms and general obstetric precautions including but not limited to vaginal bleeding, contractions, leaking of fluid and  fetal movement were reviewed in detail with the patient. Please refer to After Visit Summary for other counseling recommendations.   Return for iol 06/30/22.  Tresea Mall, CNM 07/02/2022 5:40 PM

## 2022-07-02 NOTE — Progress Notes (Signed)
Discharge to home/verbalized understanding of d/c instructions

## 2022-07-03 ENCOUNTER — Encounter: Payer: Self-pay | Admitting: Advanced Practice Midwife

## 2022-07-17 ENCOUNTER — Encounter: Payer: Self-pay | Admitting: Obstetrics

## 2022-07-17 ENCOUNTER — Ambulatory Visit (INDEPENDENT_AMBULATORY_CARE_PROVIDER_SITE_OTHER): Payer: BC Managed Care – PPO | Admitting: Obstetrics

## 2022-07-17 NOTE — Progress Notes (Signed)
Postpartum Visit  Chief Complaint:  Chief Complaint  Patient presents with   Postpartum Care    History of Present Illness: Patient is a 26 y.o. G1P1001 presents for postpartum visit at 2 weeks postpartum Her husband is with her, and they report that they are doing well. Teresha denies any physical problems. She denies any depressive symptoms or anxiety. Is getting some sleep. Her sono has had trouble achieving a good latch ( her nipples are slightly flat) so she uses shields that can be challenging  Date of delivery: 07/01/2022 Type of delivery: Vaginal delivery - Vacuum or forceps assisted  no Episiotomy No.  Laceration: yes  second degree Pregnancy or labor problems:  no Any problems since the delivery:  no  Newborn Details:  SINGLETON :  1. Baby's name: boy. Birth weight: 3190 gms Maternal Details:  Breast Feeding:  yes Post partum depression/anxiety noted:  no Edinburgh Post-Partum Depression Score:  1    Past Medical History:  Diagnosis Date   Anemia    Bacterial vaginosis    Chronic pelvic pain in female 06/09/2015   Dyspareunia in female    Headache    Ovarian cyst     Past Surgical History:  Procedure Laterality Date   LAPAROSCOPY N/A 06/09/2015   Procedure: LAPAROSCOPY DIAGNOSTIC;  Surgeon: Will Bonnet, MD;  Location: [facility] ORS;  Service: Gynecology;  Laterality: N/A;   WISDOM TOOTH EXTRACTION      Prior to Admission medications   Medication Sig Start Date End Date Taking? Authorizing Provider  Prenatal MV-Min-Fe Fum-FA-DHA (PRENATAL 1 PO) Take by mouth.   Yes [provider]    Allergies  Allergen Reactions   Cefzil [Cefprozil] Anaphylaxis   Prednisone Nausea Only     Social History   Socioeconomic History   Marital status: Married    Spouse name: Not on file   Number of children: Not on file   Years of education: Not on file   Highest education level: Not on file  Occupational History   Not on file  Tobacco Use   Smoking status:  Never   Smokeless tobacco: Never  Vaping Use   Vaping Use: Never used  Substance and Sexual Activity   Alcohol use: Not Currently    Comment: occasional   Drug use: No   Sexual activity: Yes    Birth control/protection: Pill  Other Topics Concern   Not on file  Social History Narrative   Not on file   Social Determinants of Health   Financial Resource Strain: Not on file  Food Insecurity: Not on file  Transportation Needs: Not on file  Physical Activity: Not on file  Stress: Not on file  Social Connections: Not on file  Intimate Partner Violence: Not on file    Family History  Problem Relation Age of Onset   Endometriosis Mother    Colon cancer Paternal Grandmother    Endometriosis Maternal Grandmother    Melanoma Maternal Grandmother    Diabetes Maternal Grandfather    Diabetes Paternal Grandfather    Heart disease Paternal Grandfather     Review of Systems  Constitutional: Negative.   HENT: Negative.    Eyes: Negative.   Cardiovascular: Negative.   Skin: Negative.   Neurological: Negative.   Psychiatric/Behavioral: Negative.    All other systems reviewed and are negative.    Physical Exam BP 122/74   Wt 200 lb (90.7 kg)   Breastfeeding Yes   BMI 35.43 kg/m   Physical Exam Constitutional:  Appearance: Normal appearance. She is obese.  Genitourinary:     Genitourinary Comments: deferred  HENT:     Head: Normocephalic and atraumatic.  Cardiovascular:     Rate and Rhythm: Normal rate and regular rhythm.     Pulses: Normal pulses.     Comments: Slight tachycardia, no other symptoms Pulmonary:     Effort: Pulmonary effort is normal.     Breath sounds: Normal breath sounds.  Neurological:     General: No focal deficit present.     Mental Status: She is alert and oriented to person, place, and time.  Psychiatric:     Comments: Inocente Salles is 1  Vitals and nursing note reviewed.      Female Chaperone present during breast and/or pelvic  exam.  Assessment: 26 y.o. G1P1001 presenting for 6 week postpartum visit  Plan: Problem List Items Addressed This Visit   None    1) Contraception Education given regarding options for contraception, including oral contraceptives.She will start on POPs at 6 weeks Postpartum  2)  Pap - ASCCP guidelines and rational discussed.  Patient opts for annual screening interval  3) Patient underwent screening for postpartum depression with no concerns noted.  4) Follow up a few weeks for her 6 week PP visit annual/ exam  [name], CNM  07/17/2022 12:43 PM   07/17/2022 12:35 PM

## 2022-07-24 ENCOUNTER — Ambulatory Visit: Payer: Self-pay

## 2022-07-24 NOTE — Lactation Note (Signed)
This note was copied from a baby's chart. Lactation Consultation Note  Patient Name: Kristen Galloway [id number] Date: 07/24/2022 Reason for consult: Follow-up assessment;Other (Comment) (Outpatient) Age:28 wk.o.  Mom and 28 week old baby present for outpatient appointment. During hospital stay mom states that she was told baby had a high palate, there were initial latch difficulties. She worked with [facility] during hospital stay for positioning and angling of nipple, as well as use of nipple shield to aid in feedings.  Maternal Data Has patient been taught Hand Expression?: Yes Does the patient have breastfeeding experience prior to this delivery?: No  Feeding Mother's Current Feeding Choice: Breast Milk  Baby is 46 weeks old, surpassed birth weight. Mom called for outpatient appointment last week to help with navigating away from use of nipple shield. LC provided tips to work on before appointment: -Start feed with shield, remove during feeding -All day time feeds work on removing nipple shield. -Pre pump to draw nipple out or nipple rolling if need be  LATCH Score Latch: Grasps breast easily, tongue down, lips flanged, rhythmical sucking. (A few attempts needed before latch was sustained)  Audible Swallowing: Spontaneous and intermittent  Type of Nipple: Everted at rest and after stimulation  Comfort (Breast/Nipple): Soft / non-tender  Hold (Positioning): No assistance needed to correctly position infant at breast.  LATCH Score: 10  LC present for feeding support and assessment. Mom reports working on previously given strategies. Baby placed in modified cradle where head was tilted more up towards the breast with bottom on mom's legs. LC gave instructions for mom to sandwich the breast tissue to help angle the nipple towards the roof of the baby's mouth. Initially baby did come on/off the breast 2-3 times, but then sustained the latch with rhythmic sucking pattern and loud audible  swallows for 17 minutes.  Lactation Tools Discussed/Used Tools:  (No nipple shield needed)  Interventions Interventions: Breast feeding basics reviewed;Support pillows;Education (Tips for navigating away from nipple shield use)  Discussed the efficiency of the feedings without the nipple shield, holding the sandwich hold until rhythmic pattern established with slow release, tips for keeping baby awake during the feeding. Praised mom for her diligent efforts in getting baby to latch without the shield.  Discharge Pump: Personal;Manual (Rarely used at this time)  Consult Status Consult Status: Complete  Feeding plan: -Continue current efforts of feeding without shield during daytime until all feeds can be without the shield consistently for 2-3 days -Start to work on night time feedings -Monitor how breast feels as things may change with changes in the feedings/stimulation/milk removal  Continue to reach out to lactation support if needed.  Lavonia Drafts 07/24/2022, 2:20 PM

## 2022-08-15 ENCOUNTER — Ambulatory Visit (INDEPENDENT_AMBULATORY_CARE_PROVIDER_SITE_OTHER): Payer: BC Managed Care – PPO | Admitting: Obstetrics

## 2022-08-15 ENCOUNTER — Other Ambulatory Visit (HOSPITAL_COMMUNITY)
Admission: RE | Admit: 2022-08-15 | Discharge: 2022-08-15 | Disposition: A | Discharge status: Home / Self Care | Payer: BC Managed Care – PPO | Source: Ambulatory Visit | Attending: Obstetrics | Admitting: Obstetrics

## 2022-08-15 ENCOUNTER — Encounter: Payer: Self-pay | Admitting: Obstetrics

## 2022-08-15 VITALS — BP 115/74 | HR 64 | Ht 63.0 in | Wt 194.0 lb

## 2022-08-15 DIAGNOSIS — N898 Other specified noninflammatory disorders of vagina: Secondary | ICD-10-CM | POA: Diagnosis not present

## 2022-08-15 MED ORDER — NORETHINDRONE 0.35 MG PO TABS: 1.0000 | ORAL_TABLET | Freq: Every day | ORAL | 3 refills | Status: DC

## 2022-08-15 NOTE — Progress Notes (Signed)
Postpartum Visit  Chief Complaint:  Chief Complaint  Patient presents with   Postpartum Care    History of Present Illness: Patient is a 26 y.o. G1P1001 presents for postpartum visit.  Date of delivery: 06/30/2022 Type of delivery: Vaginal delivery - Vacuum or forceps assisted  no Episiotomy No.  Laceration: yes, second degree perineal laceration that was repaired  Pregnancy or labor problems:  no Any problems since the delivery:  no  Newborn Details:  SINGLETON :  1. Baby's name: boy. Birth weight: 3190 gms Maternal Details:  Breast Feeding:  yes Post partum depression/anxiety noted:  no Edinburgh Post-Partum Depression Score:  2  Date of last PAP: 2022  LGSIL, which needs follow up   Past Medical History:  Diagnosis Date   Anemia    Bacterial vaginosis    Chronic pelvic pain in female 06/09/2015   Dyspareunia in female    Headache    Ovarian cyst    Supervision of normal pregnancy 11/15/2021    Nursing Staff Provider Office Location  [facility] Dating  EDD 8/29, CW L MP Language  English Anatomy US  normal Flu Vaccine   Genetic Screen  NIPS: negative. Does not want to know gender AFP neg TDaP vaccine   04/17/22 Hgb A1C or  GTT Early : Third trimester : 95 Covid    LAB RESULTS  Rhogam   Blood Type O/Positive/-- (02/16 1426)  Feeding Plan Breast Antibody Negative (02/16 1426) Contraception P    Past Surgical History:  Procedure Laterality Date   LAPAROSCOPY N/A 06/09/2015   Procedure: LAPAROSCOPY DIAGNOSTIC;  Surgeon: Will Bonnet, MD;  Location: [facility] ORS;  Service: Gynecology;  Laterality: N/A;   WISDOM TOOTH EXTRACTION      Prior to Admission medications   Medication Sig Start Date End Date Taking? Authorizing Provider  norethindrone (MICRONOR) 0.35 MG tablet Take 1 tablet (0.35 mg total) by mouth daily. 08/15/22  Yes Imagene Riches, CNM  Prenatal MV-Min-Fe Fum-FA-DHA (PRENATAL 1 PO) Take by mouth.    [provider]    Allergies  Allergen Reactions    Cefzil [Cefprozil] Anaphylaxis   Prednisone Nausea Only     Social History   Socioeconomic History   Marital status: Married    Spouse name: Not on file   Number of children: Not on file   Years of education: Not on file   Highest education level: Not on file  Occupational History   Not on file  Tobacco Use   Smoking status: Never   Smokeless tobacco: Never  Vaping Use   Vaping Use: Never used  Substance and Sexual Activity   Alcohol use: Not Currently    Comment: occasional   Drug use: No   Sexual activity: Yes    Birth control/protection: Pill  Other Topics Concern   Not on file  Social History Narrative   Not on file   Social Determinants of Health   Financial Resource Strain: Not on file  Food Insecurity: Not on file  Transportation Needs: Not on file  Physical Activity: Not on file  Stress: Not on file  Social Connections: Not on file  Intimate Partner Violence: Not on file    Family History  Problem Relation Age of Onset   Endometriosis Mother    Colon cancer Paternal Grandmother    Endometriosis Maternal Grandmother    Melanoma Maternal Grandmother    Diabetes Maternal Grandfather    Diabetes Paternal Grandfather    Heart disease Paternal Grandfather  Review of Systems  Constitutional: Negative.   HENT: Negative.    Eyes: Negative.   Respiratory: Negative.    Cardiovascular: Negative.   Skin: Negative.   All other systems reviewed and are negative.    Physical Exam BP 115/74   Pulse 64   Ht 5\' 3"  (1.6 m)   Wt 88 kg   Breastfeeding Yes   BMI 34.37 kg/m   Physical Exam Constitutional:      Appearance: Normal appearance. She is obese.  Genitourinary:     Vulva and rectum normal.     Genitourinary Comments: Moderate amounts of white discharge- aptima swab retrieved. Uterus is involuted. Some localized tenderness at the introitus- otherwise the perineum is well healed.  Cardiovascular:     Rate and Rhythm: Normal rate and regular  rhythm.     Pulses: Normal pulses.     Heart sounds: Normal heart sounds.  Pulmonary:     Effort: Pulmonary effort is normal.     Breath sounds: Normal breath sounds.  Abdominal:     Palpations: Abdomen is soft.  Musculoskeletal:        General: Normal range of motion.     Cervical back: Normal range of motion and neck supple.  Neurological:     General: No focal deficit present.     Mental Status: She is alert and oriented to person, place, and time.  Skin:    General: Skin is warm and dry.  Psychiatric:     Comments: Edinburgh score is a 2.      Female Chaperone present during breast and/or pelvic exam.  Assessment: 26 y.o. G1P1001 presenting for 6 week postpartum visit Vaginal discharge Desires Micronor for [facility]  Plan: Problem List Items Addressed This Visit       Other   RESOLVED: Postpartum care following vaginal delivery - Primary   Relevant Medications   norethindrone (MICRONOR) 0.35 MG tablet   Other Visit Diagnoses     Vaginal discharge       Relevant Medications   norethindrone (MICRONOR) 0.35 MG tablet        1) Contraception Education given regarding options for contraception, including oral contraceptives.I have sent in RX for Micoronor  2)  Pap - ASCCP guidelines and rational discussed.  Patient opts for annual screening interval  3) Patient underwent screening for postpartum depression with no concerns noted.  4) Follow up 6 months for a repeat pap smear (LGSIL pap) I sent in an aptima swab for testing. She is advised to use ample lubrication when resuming intercourse.  [facility] CNM  08/15/2022 11:24 AM   08/15/2022 11:24 AM

## 2022-08-16 ENCOUNTER — Other Ambulatory Visit: Payer: Self-pay | Admitting: Obstetrics

## 2022-08-16 LAB — CERVICOVAGINAL ANCILLARY ONLY
Bacterial Vaginitis (gardnerella): POSITIVE — AB
Candida Glabrata: NEGATIVE
Candida Vaginitis: NEGATIVE
Chlamydia: NEGATIVE
Comment: NEGATIVE
Comment: NEGATIVE
Comment: NEGATIVE
Comment: NEGATIVE
Comment: NEGATIVE
Comment: NORMAL
Neisseria Gonorrhea: NEGATIVE
Trichomonas: NEGATIVE

## 2022-08-17 ENCOUNTER — Other Ambulatory Visit: Payer: Self-pay

## 2022-08-17 DIAGNOSIS — N760 Acute vaginitis: Secondary | ICD-10-CM

## 2022-08-17 MED ORDER — METRONIDAZOLE 0.75 % VA GEL: 1.0000 | Freq: Every day | VAGINAL | 0 refills | Status: AC

## 2022-09-07 IMAGING — US US OB < 14 WEEKS - US OB TV
1 series · 14 of 28 positions shown · non-contrast
Comparison: None.

CLINICAL DATA: Right lower quadrant abdominal pain. Spotting. LMP:
09/19/2021 corresponding to an estimated gestational age of 9 weeks,
0 days.

EXAM:
OBSTETRIC <14 WK US AND TRANSVAGINAL OB US
TECHNIQUE: Both transabdominal and transvaginal ultrasound examinations were
performed for complete evaluation of the gestation as well as the
maternal uterus, adnexal regions, and pelvic cul-de-sac.
Transvaginal technique was performed to assess early pregnancy.

[Series 1: us ob less than 14 weeks with ob transvaginal · 14 of 126 slices shown]
[im 5/126]
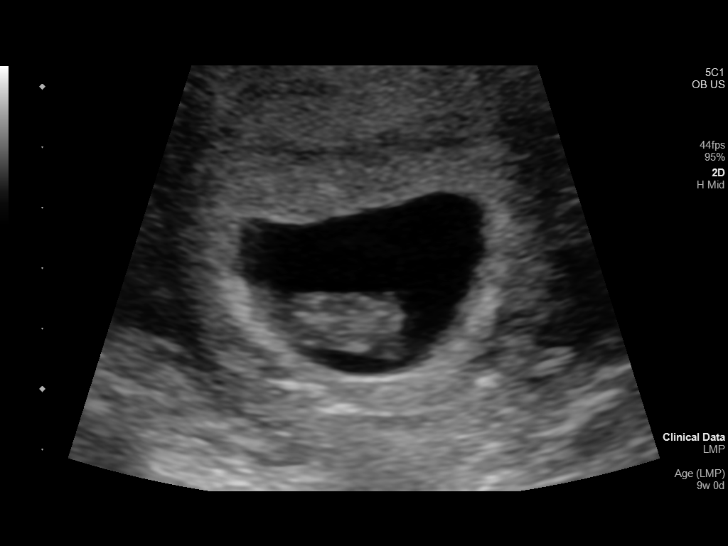
[im 14/126]
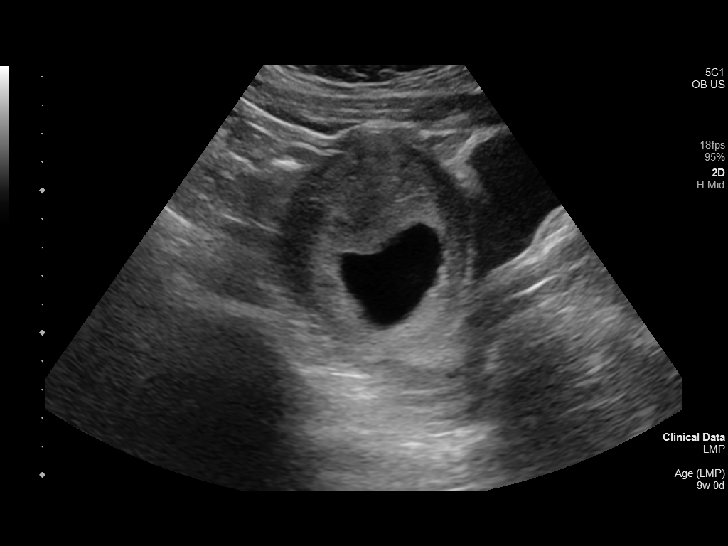
[im 24/126]
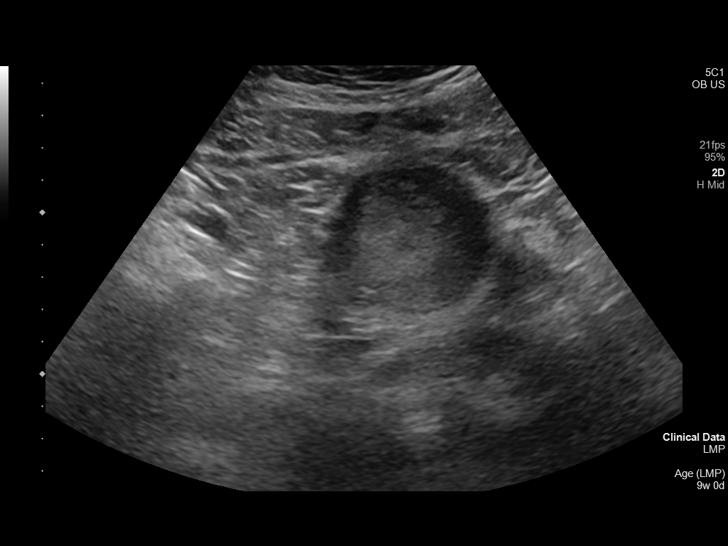
[im 33/126]
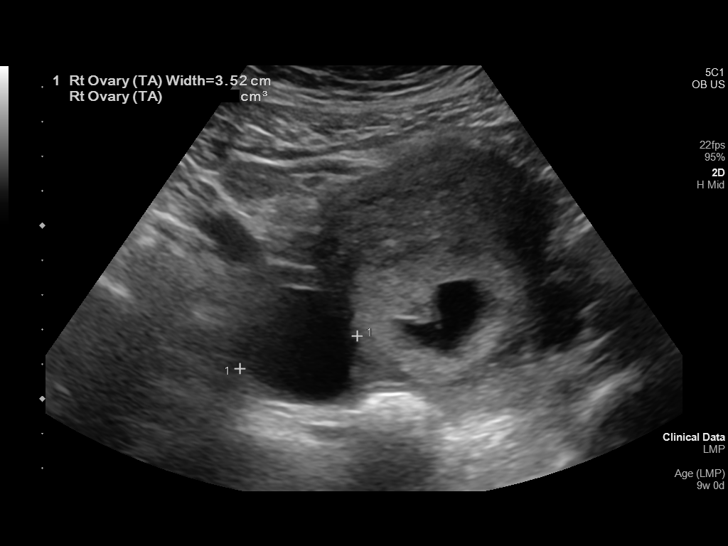
[im 42/126]
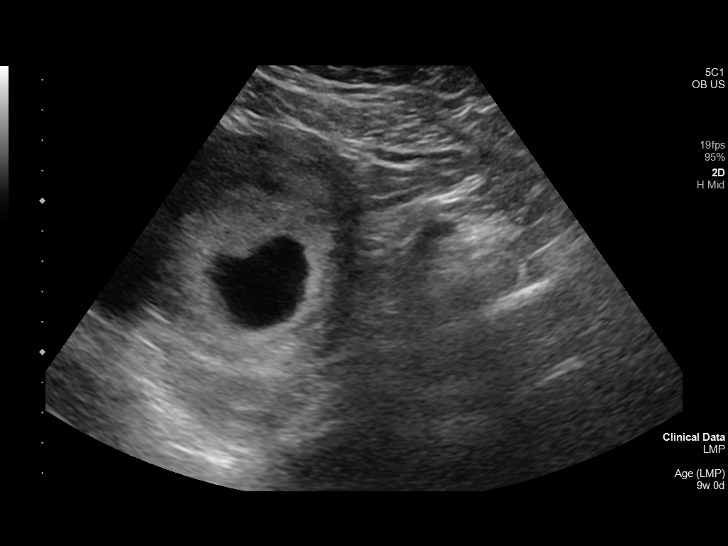
[im 51/126]
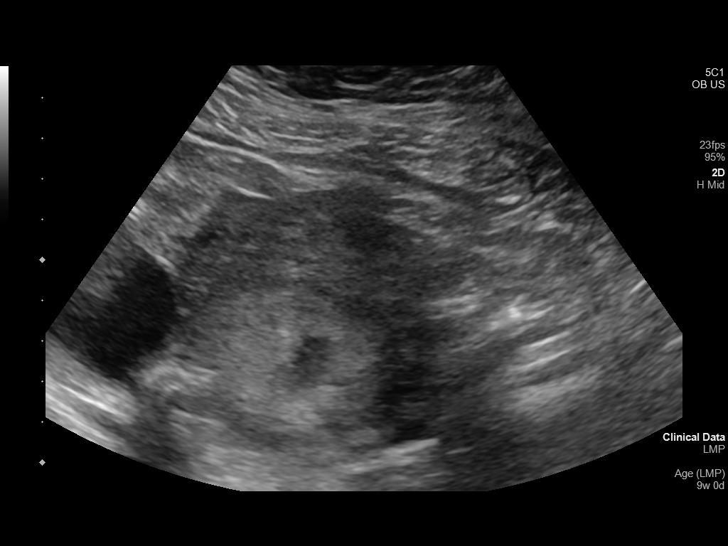
[im 61/126]
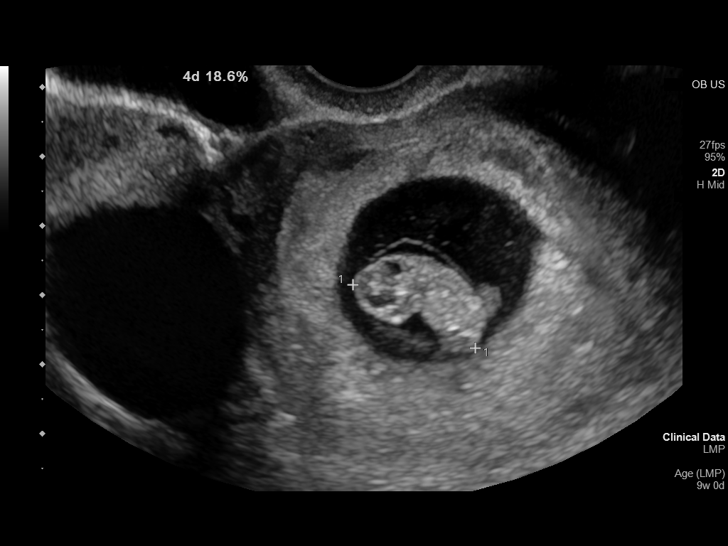
[im 70/126]
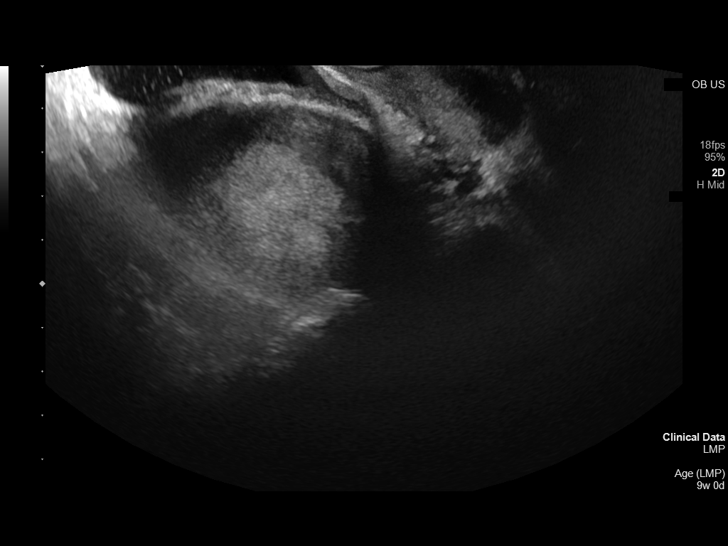
[im 79/126]
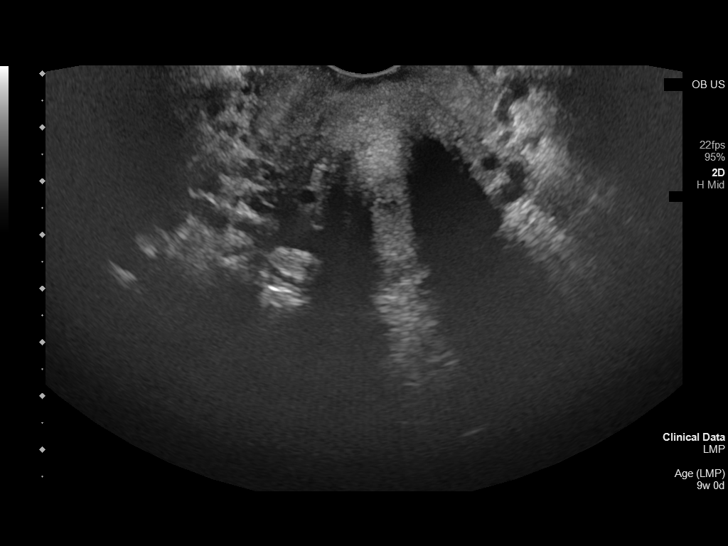
[im 88/126]
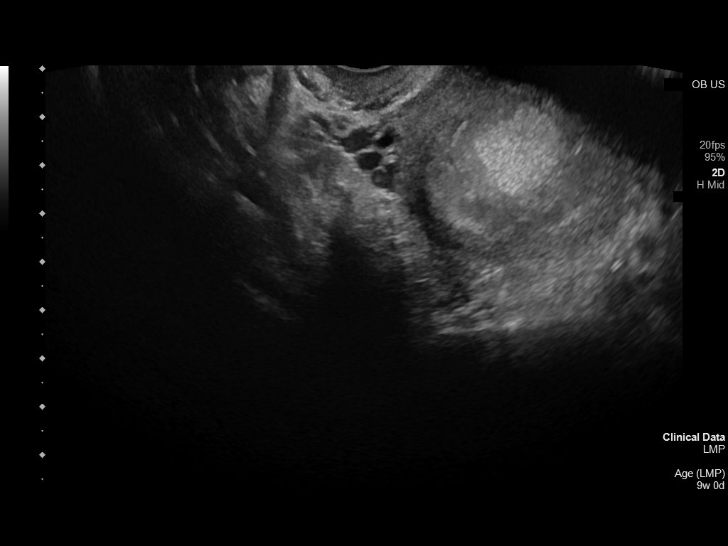
[im 98/126]
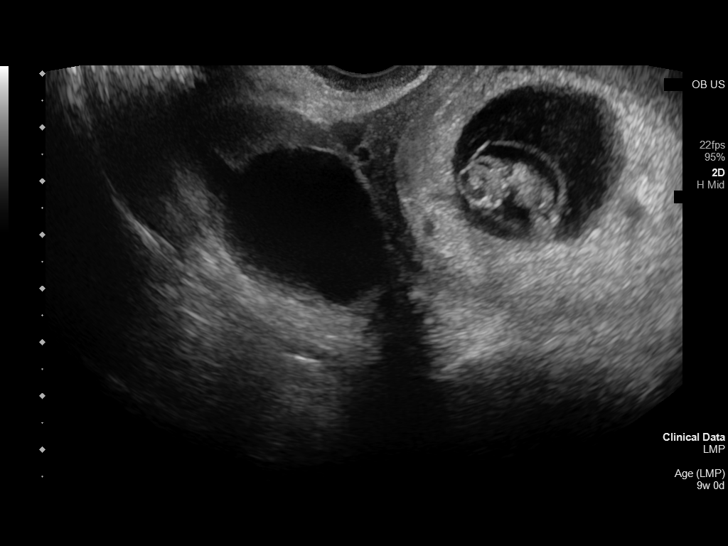
[im 107/126]
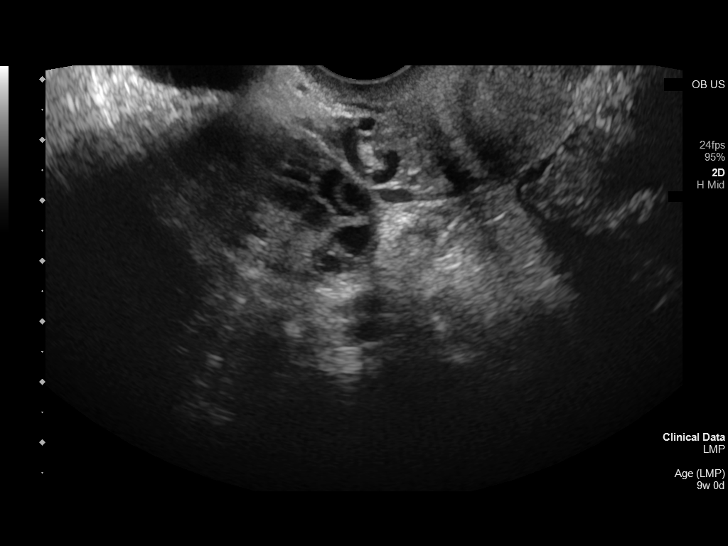
[im 116/126]
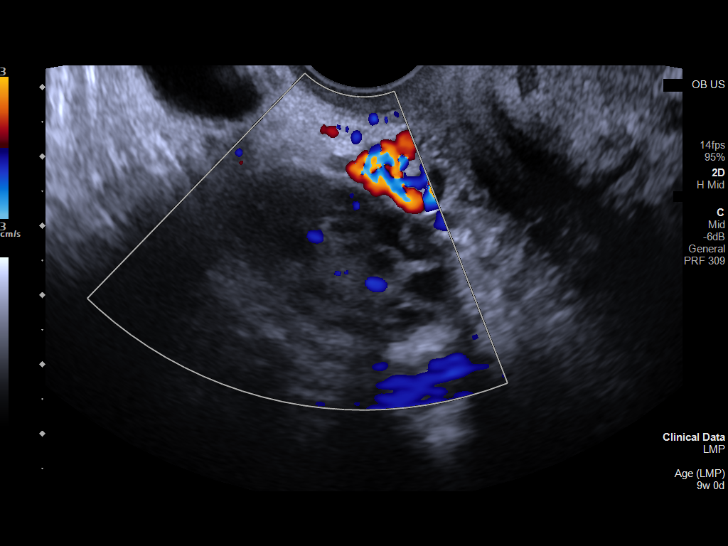
[im 126/126]
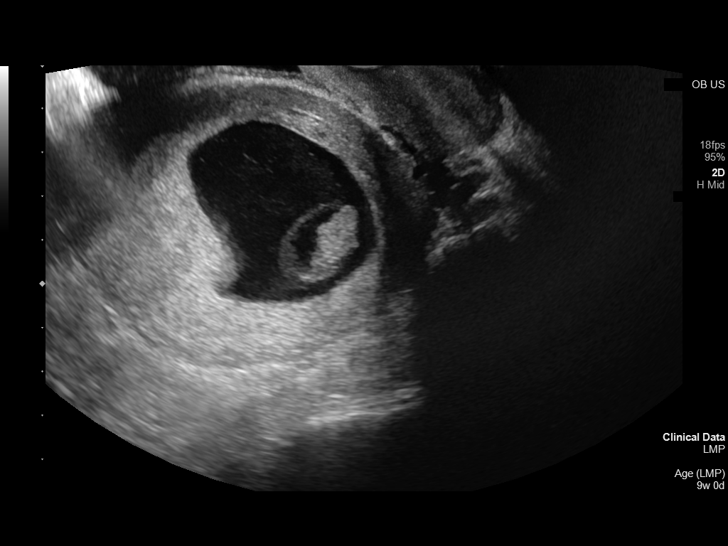

[14 of 28 positions shown; findings below may reference images not displayed]

FINDINGS: Intrauterine gestational sac: Single intrauterine gestational sac.

Yolk sac:  Seen

Embryo:  Present

Cardiac Activity: Detected

Heart Rate: 178 bpm

CRL:  20 mm   8 w   4 d                  US EDC: 06/29/2022

Subchorionic hemorrhage:  None visualized.

Maternal uterus/adnexae: The left ovary is unremarkable. There is a
3.7 cm cyst in the right ovary. Small amount of fluid noted within
the cervical canal.
IMPRESSION: 1. Single live intrauterine pregnancy with an estimated gestational
age of 8 weeks, 4 days.
2. Small fluid within the cervix.

## 2022-10-03 ENCOUNTER — Telehealth: Payer: Self-pay

## 2022-10-03 DIAGNOSIS — N898 Other specified noninflammatory disorders of vagina: Secondary | ICD-10-CM

## 2022-10-03 MED ORDER — NORETHINDRONE 0.35 MG PO TABS: 1.0000 | ORAL_TABLET | Freq: Every day | ORAL | 3 refills | Status: DC

## 2022-10-03 NOTE — Telephone Encounter (Signed)
Pt needs her [facility] sent to [facility]. Rx sent, pt aware.

## 2022-11-14 ENCOUNTER — Encounter: Payer: Self-pay | Admitting: Physician Assistant

## 2022-11-14 ENCOUNTER — Ambulatory Visit: Payer: BC Managed Care – PPO | Admitting: Physician Assistant

## 2022-11-14 VITALS — BP 109/67 | Temp 98.9°F | Ht 64.0 in | Wt 185.0 lb

## 2022-11-14 DIAGNOSIS — J069 Acute upper respiratory infection, unspecified: Secondary | ICD-10-CM

## 2022-11-14 DIAGNOSIS — J029 Acute pharyngitis, unspecified: Secondary | ICD-10-CM

## 2022-11-14 LAB — POCT INFLUENZA A/B
Influenza A, POC: NEGATIVE
Influenza B, POC: NEGATIVE

## 2022-11-14 LAB — POC COVID19 BINAXNOW: SARS Coronavirus 2 Ag: NEGATIVE

## 2022-11-14 LAB — POCT RAPID STREP A (OFFICE): Rapid Strep A Screen: NEGATIVE

## 2022-11-14 NOTE — Progress Notes (Signed)
      Established patient visit   Patient: Kristen Galloway   DOB: 11/26/1995   27 y.o. Female  MRN: [id number] Visit Date: 11/14/2022  Today's healthcare provider: Mikey Kirschner, PA-C   Chief Complaint  Patient presents with   Sore Throat    Pt stated--sore throat, chills, migraines, congestion--3 weeks   Subjective    HPI Pt reports she initially started with a sore throat, fever, cough, congestion on 12/24, improved after 1 week. Yesterday she started with a a headache, congestion, sore throat. Denies fever, cough, chills, body aches.  Her 4 mo old at home is also congested. She works and he is in day care. She is currently breastfeeding.  Denies taking anything over the counter.  Medications: Outpatient Medications Prior to Visit  Medication Sig   norethindrone (MICRONOR) 0.35 MG tablet Take 1 tablet (0.35 mg total) by mouth daily.   Prenatal MV-Min-Fe Fum-FA-DHA (PRENATAL 1 PO) Take by mouth.   No facility-administered medications prior to visit.    Review of Systems  Constitutional:  Positive for fatigue. Negative for fever.  HENT:  Positive for sore throat.   Respiratory:  Negative for cough and shortness of breath.   Cardiovascular:  Negative for chest pain and leg swelling.  Gastrointestinal:  Negative for abdominal pain.  Neurological:  Negative for dizziness and headaches.      Objective    BP 109/67 (BP Location: Left Arm, Patient Position: Sitting, Cuff Size: Normal)   Temp 98.9 F (37.2 C)   Ht 5\' 4"  (1.626 m)   Wt 185 lb (83.9 kg)   SpO2 94%   Breastfeeding Yes   BMI 31.76 kg/m    Physical Exam Constitutional:      General: She is awake.     Appearance: She is well-developed.  HENT:     Head: Normocephalic.     Mouth/Throat:     Pharynx: Posterior oropharyngeal erythema present. No oropharyngeal exudate.  Eyes:     Conjunctiva/sclera: Conjunctivae normal.  Cardiovascular:     Rate and Rhythm: Normal rate and regular rhythm.      Heart sounds: Normal heart sounds.  Pulmonary:     Effort: Pulmonary effort is normal.     Breath sounds: Normal breath sounds.  Skin:    General: Skin is warm.  Neurological:     Mental Status: She is alert and oriented to person, place, and time.  Psychiatric:        Attention and Perception: Attention normal.        Mood and Affect: Mood normal.        Speech: Speech normal.        Behavior: Behavior is cooperative.      No results found for any visits on 11/14/22.  Assessment & Plan     URI Poc covid 19, flu a/b, strep a, negative. Advised increase fluids, rest, tylenol otc.  If symptoms do not improve/worsen please call office for further recommendations  Return if symptoms worsen or fail to improve.      I, Mikey Kirschner, PA-C have reviewed all documentation for this visit. The documentation on  11/14/22 for the exam, diagnosis, procedures, and orders are all accurate and complete.  Mikey Kirschner, PA-C [facility] [address] [place], [place], [address] Office: [phone] Fax: [place]

## 2022-11-23 IMAGING — US US OB COMP +14 WK
1 series · 13 of 28 positions shown · non-contrast
Comparison: none

CLINICAL DATA: Fetal anatomy

EXAM:
OBSTETRICAL ULTRASOUND >14 WKS AND BIOPHYSICAL PROFILE

[Series 1: us ob comp +14 wk · 0.23mm/px · 13 of 81 slices shown]
[im 3/81]
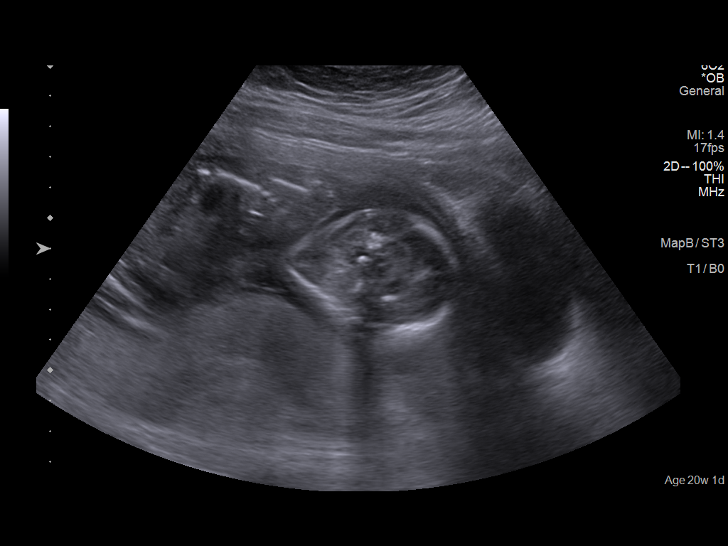
[im 9/81]
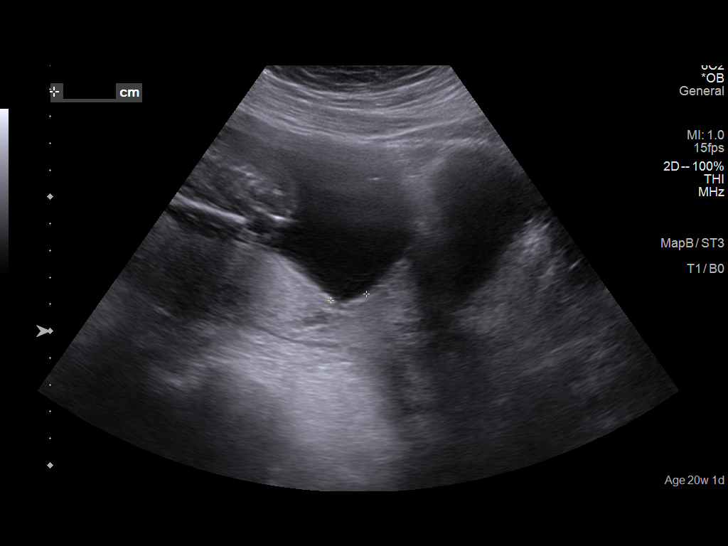
[im 15/81]
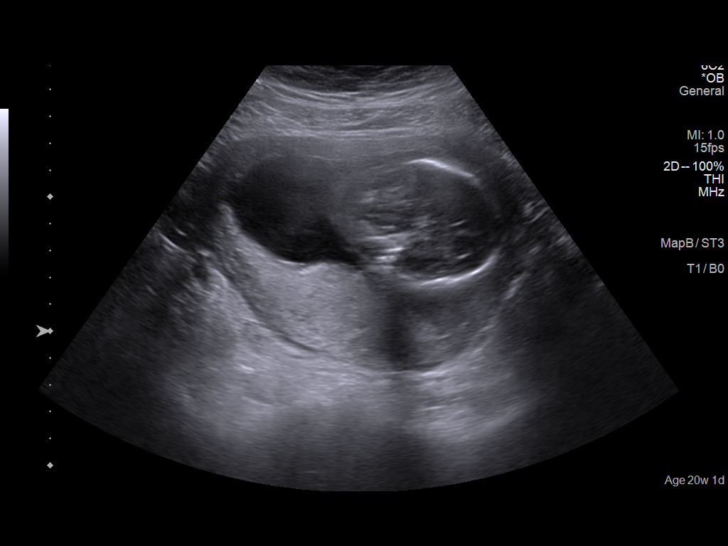
[im 21/81]
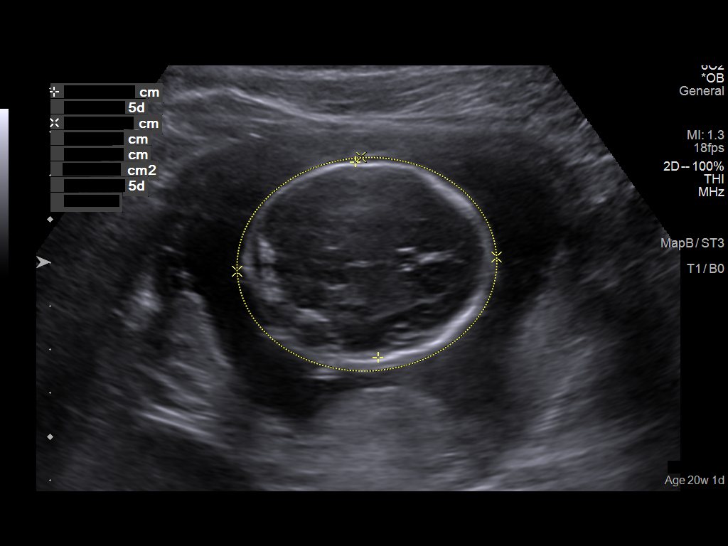
[im 27/81]
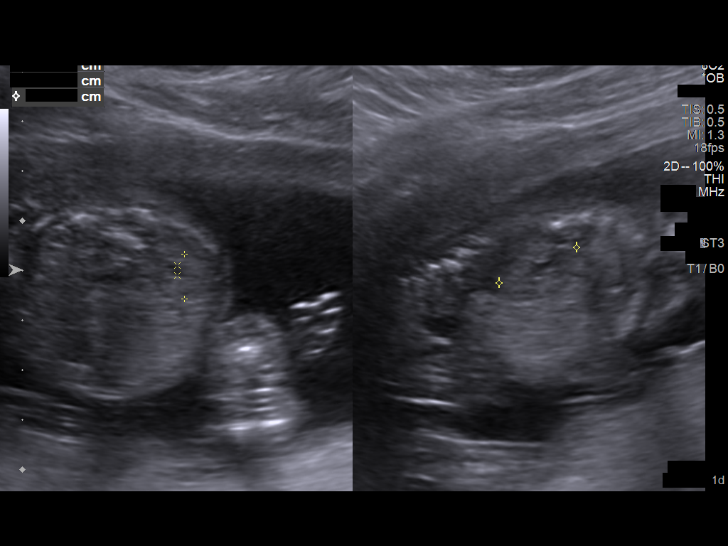
[im 33/81]
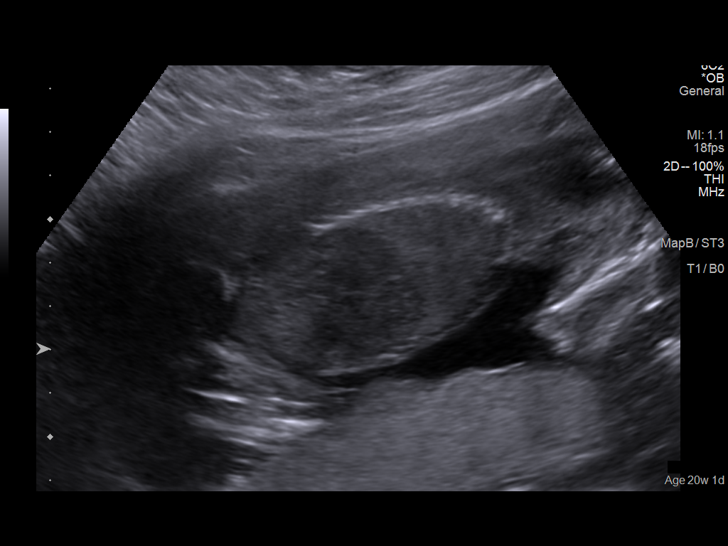
[im 42/81]
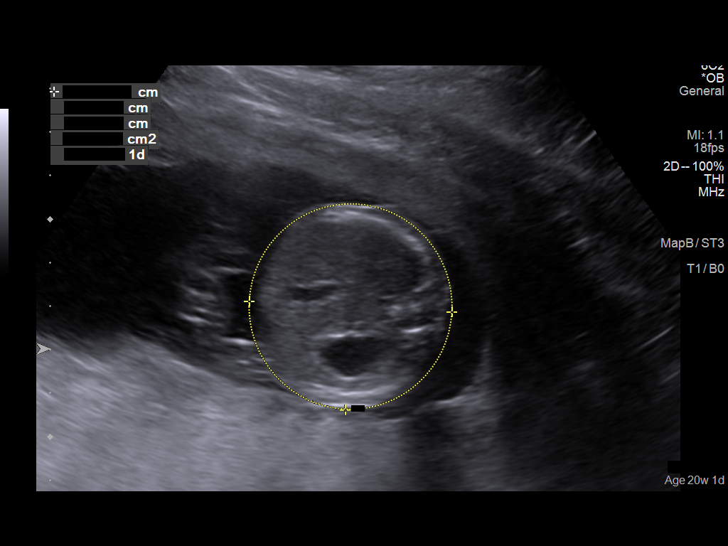
[im 48/81]
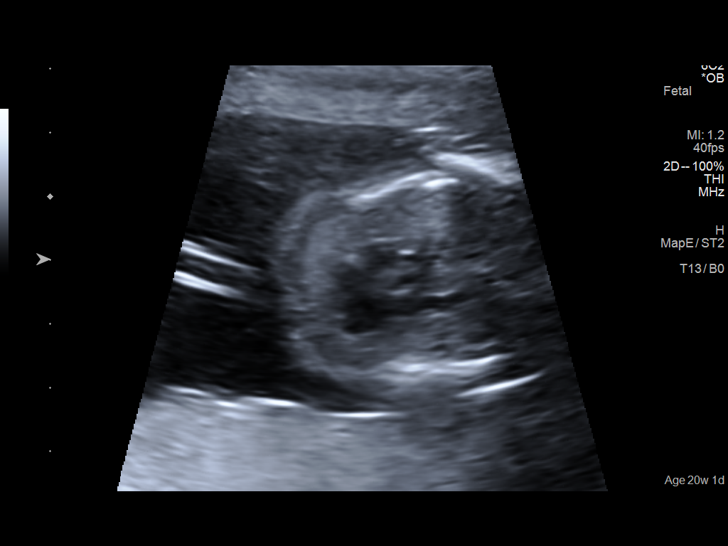
[im 54/81]
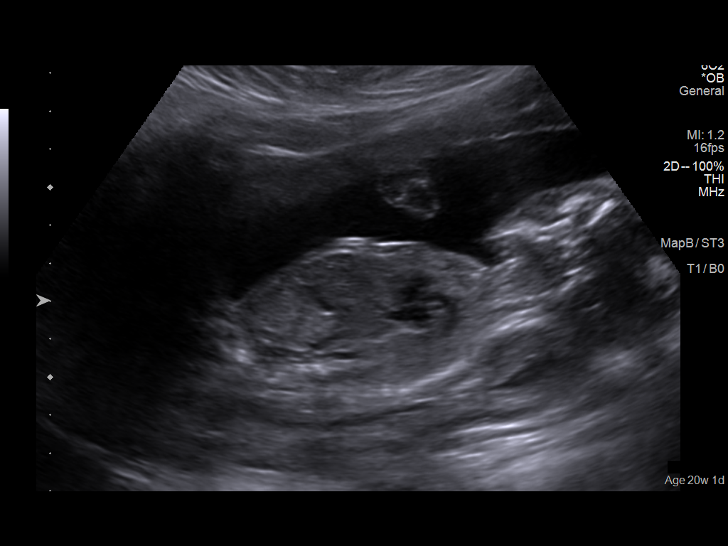
[im 60/81]
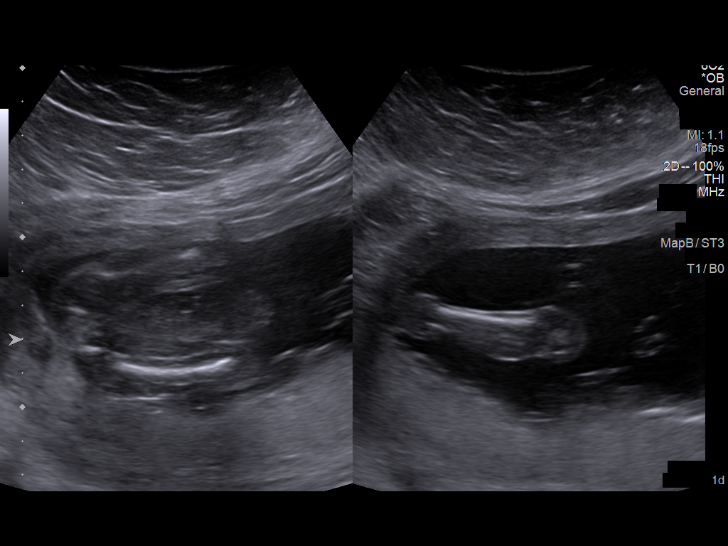
[im 66/81]
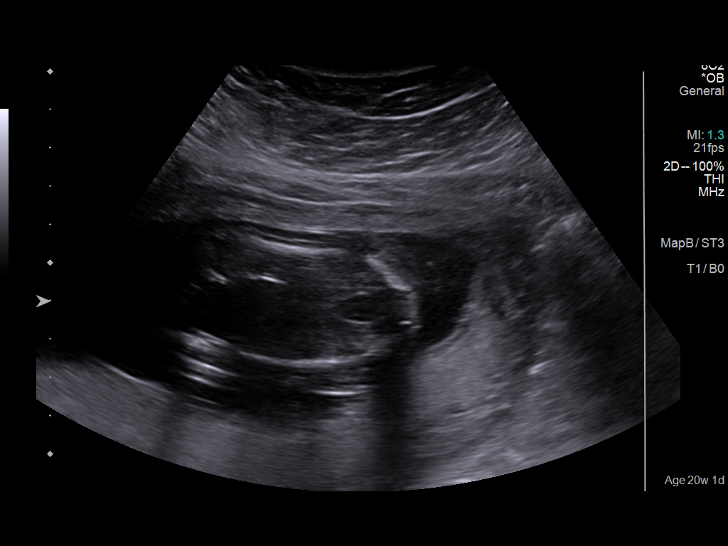
[im 72/81]
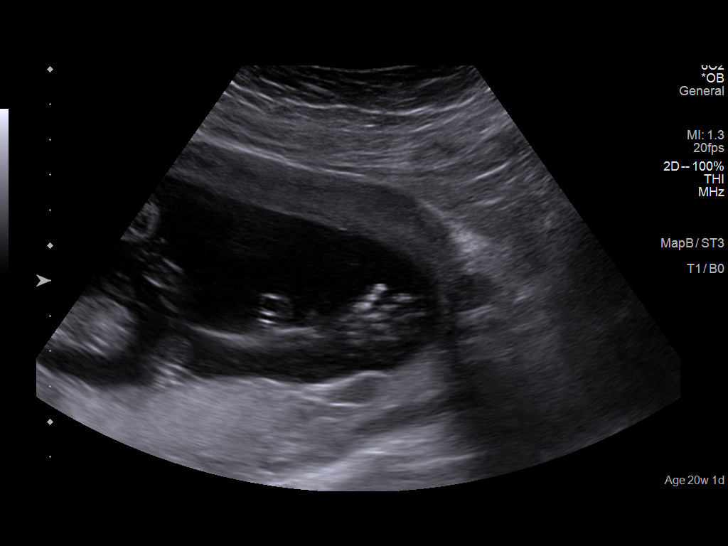
[im 78/81]
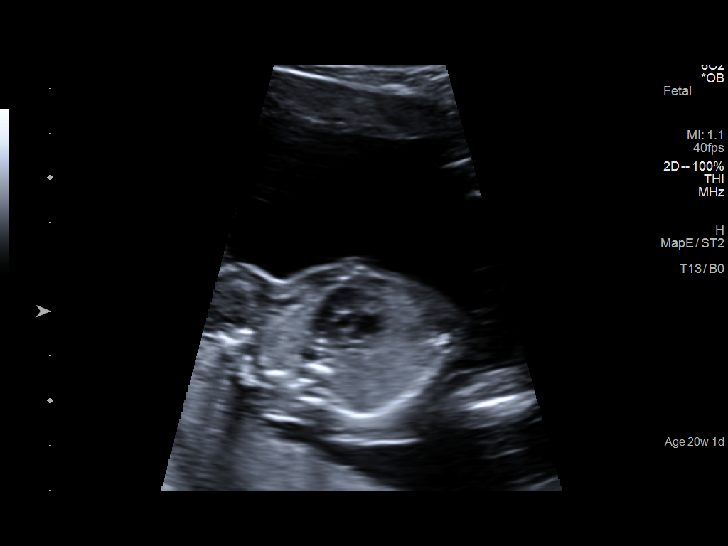

[13 of 28 positions shown; findings below may reference images not displayed]

FINDINGS: Number of Fetuses: 1

Heart Rate:  150 bpm

Movement: Yes

Presentation: Cephalic

Previa: Marginal

Placental Location: Posterior

Amniotic Fluid (Subjective): Normal

Amniotic Fluid (Objective):

Vertical pocket 3.7cm

FETAL BIOMETRY

BPD:  4.6cm 19w 5d

HC:    17.2cm 19w 6d

AC:   14.8cm 20w 1d

FL:   3.1cm 19w 4d

Current Mean GA: 19w 6d US EDC: 06/27/2022

Assigned GA: 20w 1d Assigned EDC: 06/26/2022

Estimated Fetal Weight:  315g 28%ile

FETAL ANATOMY

Lateral Ventricles: Not visualized

Thalami/CSP: Appears normal

Posterior Fossa: Appears normal

Nuchal Region: Appears normal    NFT= 3 mm

Upper Lip: Appears normal

Spine: Not visualized

4 Chamber Heart on Left: Appears normal

LVOT: Appears normal

RVOT: Appears normal

Stomach on Left: Appears normal

3 Vessel Cord: Appears normal

Cord Insertion site: Appears normal

Kidneys: Appears normal

Bladder: Appears normal

Extremities: Appears normal

Sex: Male

Technical Limitations: Fetal position

Maternal Findings:

Cervix:  3.9 cm and closed
IMPRESSION: 1. Single live intrauterine gestation with approximate gestational
age of 19 weeks and 6 days, EDC based on today's sonogram is
06/27/2022
2. Limited evaluation of the lateral ventricles and spine due to
positioning.

## 2022-12-03 ENCOUNTER — Telehealth: Payer: Self-pay

## 2022-12-03 NOTE — Telephone Encounter (Signed)
Patient calling in to discuss vaginal spotting. She states she is still breastfeeding but has had two occurrences of 3-4 days of spotting two months apart, reports cramping as well. Patient is currently using Micronor for birth control. Discussed how spotting while on ocp and breastfeeding can be normal. Advised patient to let us know if anything changes or her bleeding and cramping get worse. No additional concerns.

## 2022-12-21 IMAGING — US US OB FOLLOW-UP
1 series · 13 of 28 positions shown · non-contrast
Comparison: none

CLINICAL DATA: Follow-up anatomy

EXAM:
OBSTETRIC 14+ WK ULTRASOUND FOLLOW-UP

[Series 1: us ob follow-up · 0.25mm/px · 13 of 69 slices shown]
[im 3/69]
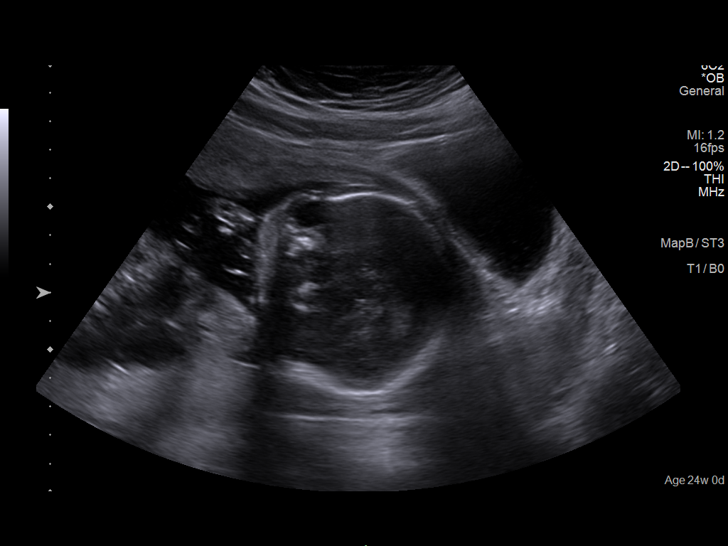
[im 8/69]
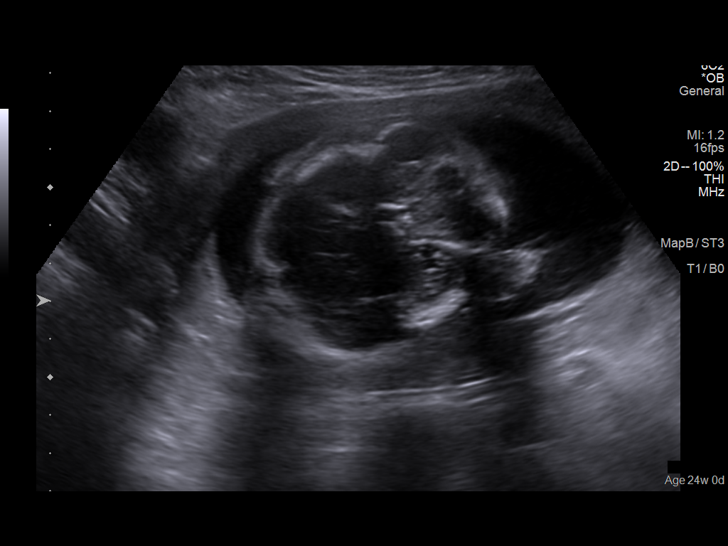
[im 13/69]
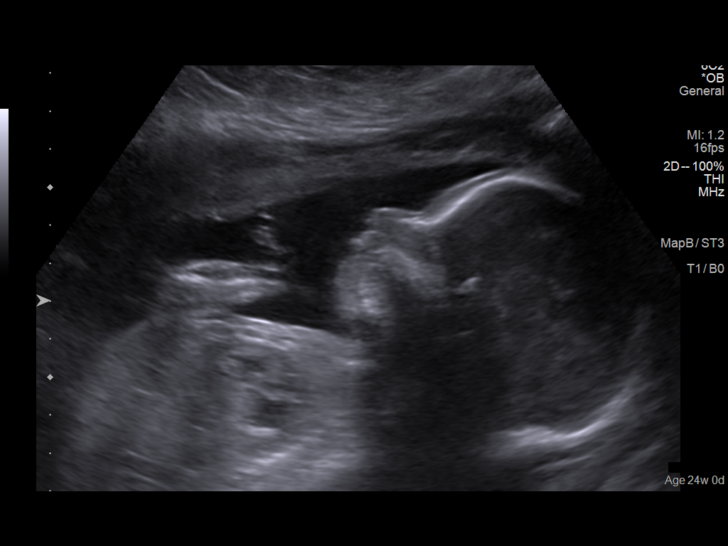
[im 18/69]
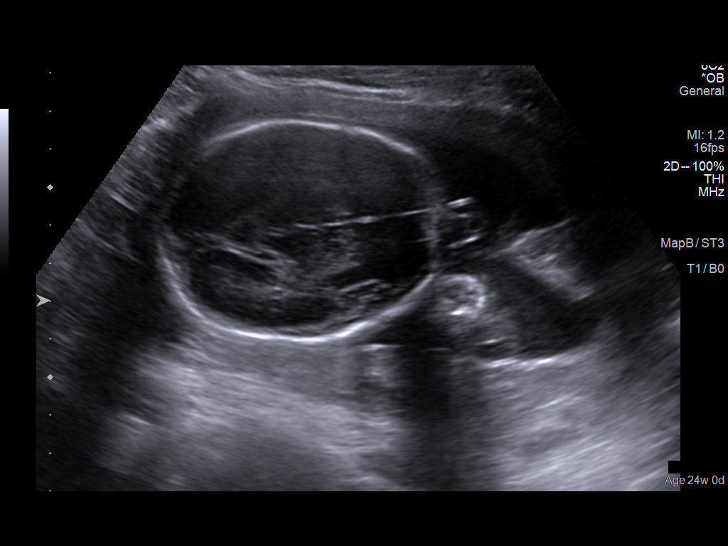
[im 23/69]
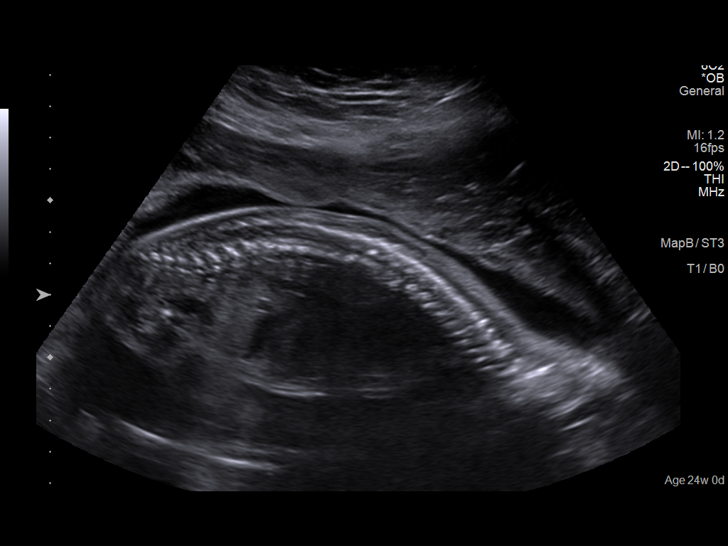
[im 28/69]
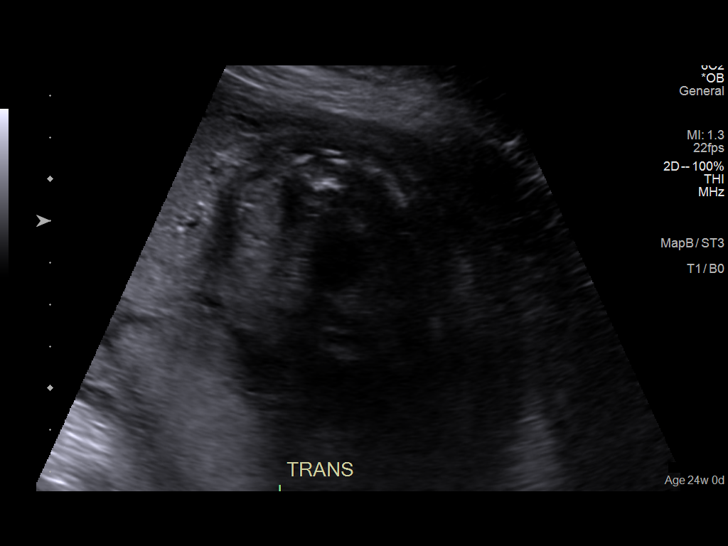
[im 36/69]
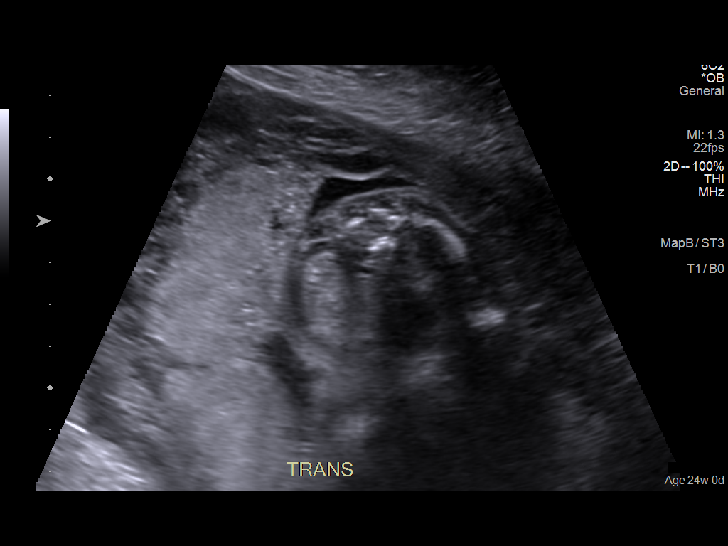
[im 41/69]
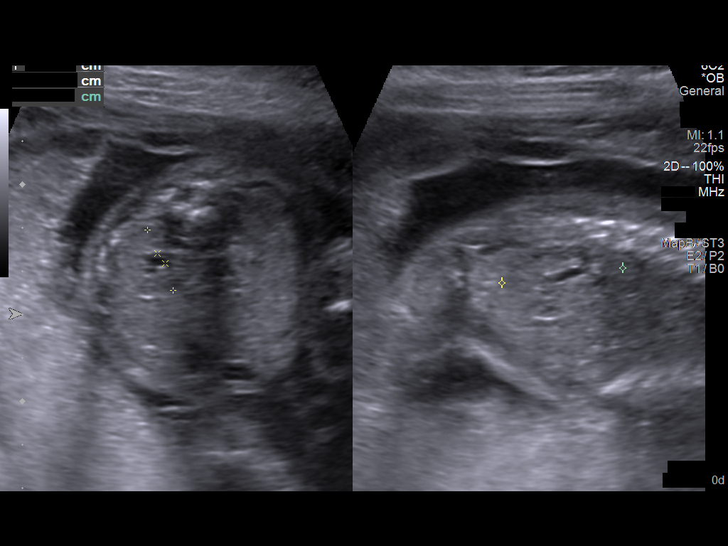
[im 46/69]
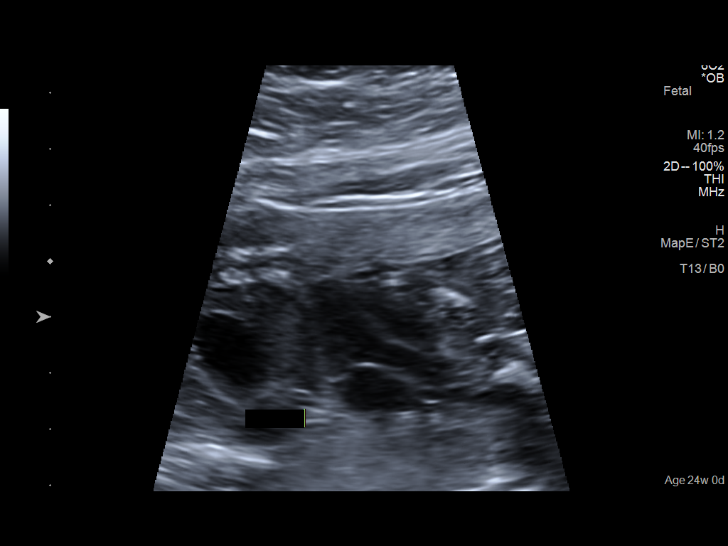
[im 51/69]
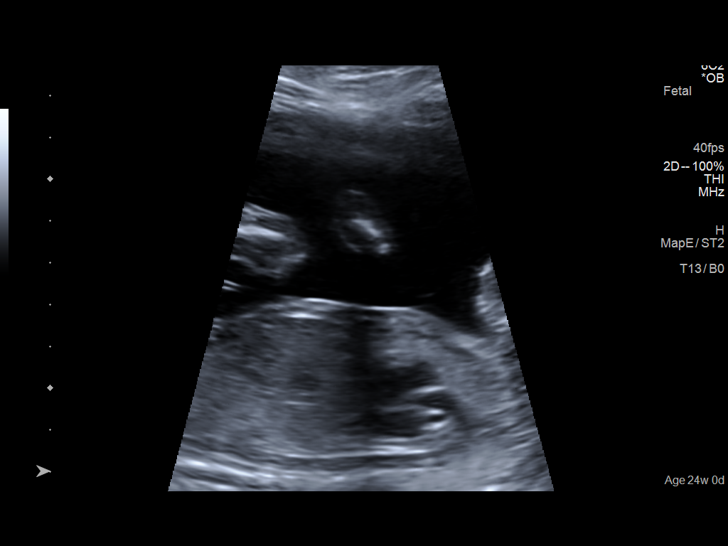
[im 56/69]
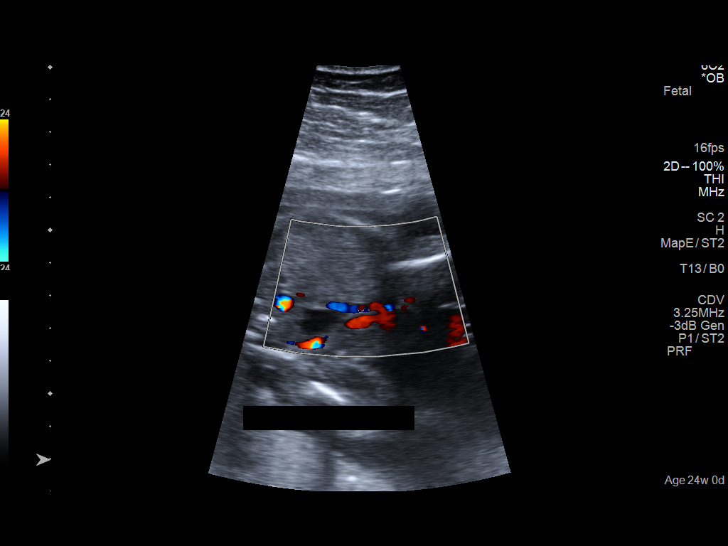
[im 61/69]
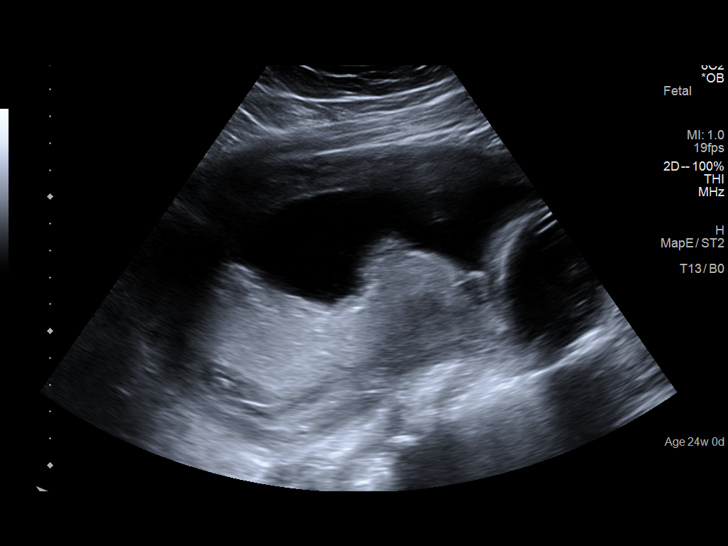
[im 66/69]
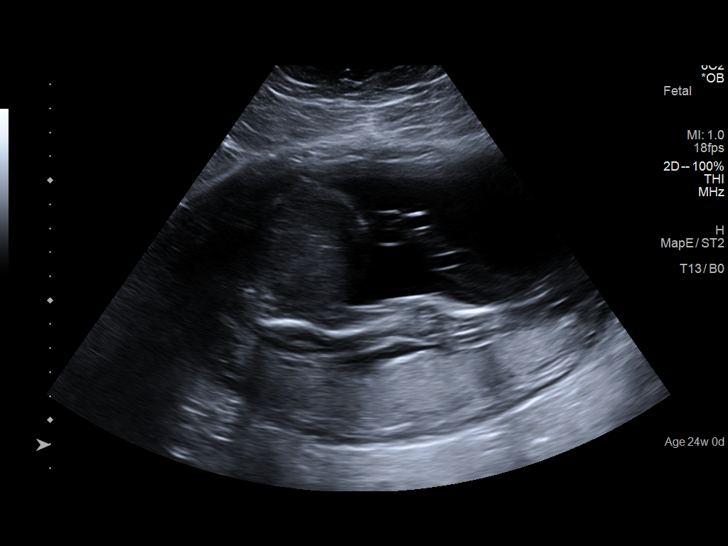

[13 of 28 positions shown; findings below may reference images not displayed]

FINDINGS: Number of Fetuses: 1

Heart Rate:  155 bpm

Movement: Yes

Presentation: Cephalic

Previa: No

Placental Location: Posterior

Amniotic Fluid (Subjective): Normal

Amniotic Fluid (Objective):

Vertical pocket 7.3cm

FETAL BIOMETRY

BPD:  5.8cm 23w 5d

HC:    21.6cm 23w 4d

AC:    19.3cm 24w 0d

FL:    4.6cm 25w 1d

Current Mean GA: 24w 2d              US EDC: 06/24/2022

Assigned GA: 24w 0d     Assigned EDC: 06/26/2022

Estimated Fetal Weight:  687g 58%ile

FETAL ANATOMY

Lateral Ventricles: Appears normal

Thalami/CSP: Previously seen

Posterior Fossa: Previously seen

Nuchal Region: Previously seen

Upper Lip: Previously seen

Spine: Appears normal

4 Chamber Heart on Left: Previously seen

LVOT: Previously seen

RVOT: Previously seen

Stomach on Left: Previously seen

3 Vessel Cord: Previously seen

Cord Insertion site: Previously seen

Kidneys: Previously seen

Bladder: Previously seen

Extremities: Previously seen

Sex: Previously Seen

Technical Limitations: Maternal habitus

Maternal Findings:

Cervix:  4 cm
IMPRESSION: 1. Single live intrauterine pregnancy with estimated gestational age
of 24 weeks 2 days based on the current study.
2. Follow-up fetal anatomy appears within normal limits.

## 2023-02-18 ENCOUNTER — Telehealth: Payer: Self-pay

## 2023-02-19 NOTE — Telephone Encounter (Signed)
Spoke with patient in regards to medical records question.

## 2023-03-11 ENCOUNTER — Ambulatory Visit (INDEPENDENT_AMBULATORY_CARE_PROVIDER_SITE_OTHER): Payer: BC Managed Care – PPO | Admitting: Obstetrics

## 2023-03-11 ENCOUNTER — Other Ambulatory Visit (HOSPITAL_COMMUNITY)
Admission: RE | Admit: 2023-03-11 | Discharge: 2023-03-11 | Disposition: A | Discharge status: Home / Self Care | Payer: BC Managed Care – PPO | Source: Ambulatory Visit | Attending: Obstetrics | Admitting: Obstetrics

## 2023-03-11 ENCOUNTER — Encounter: Payer: Self-pay | Admitting: Obstetrics

## 2023-03-11 VITALS — BP 123/80 | HR 66 | Resp 15 | Ht 63.0 in | Wt 168.4 lb

## 2023-03-11 DIAGNOSIS — Z124 Encounter for screening for malignant neoplasm of cervix: Secondary | ICD-10-CM

## 2023-03-11 DIAGNOSIS — Z01419 Encounter for gynecological examination (general) (routine) without abnormal findings: Secondary | ICD-10-CM | POA: Diagnosis not present

## 2023-03-11 DIAGNOSIS — Z3009 Encounter for other general counseling and advice on contraception: Secondary | ICD-10-CM

## 2023-03-11 MED ORDER — NORETHIN ACE-ETH ESTRAD-FE 1-20 MG-MCG PO TABS: 1.0000 | ORAL_TABLET | Freq: Every day | ORAL | 3 refills | Status: DC

## 2023-03-11 NOTE — Progress Notes (Signed)
Gynecology Annual Exam   PCP: Alfredia Ferguson, PA-C  Chief Complaint:  Chief Complaint  Patient presents with   Annual Exam    History of Present Illness: Patient is a 27 y.o. G1P1001 presents for annual exam. The patient has no complaints today.  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 Still nursing her Margo Aye, who is 8 months. Still on Micronor OCPs.  LMP: No LMP recorded. (Menstrual status: Oral contraceptives). Average Interval: regular, 28 days Duration of flow: 5 days Heavy Menses: no Clots: no Intermenstrual Bleeding: no Postcoital Bleeding: no Dysmenorrhea: no  The patient is sexually active. She currently uses oral progesterone-only contraceptive for contraception. She is still breastfeeding her 29 month old son.She denies dyspareunia.  The patient does not perform self breast exams.  There is no notable family history of breast or ovarian cancer in her family.  The patient wears seatbelts: yes.   The patient has regular exercise: yes.    The patient denies current symptoms of depression.    Review of Systems: Review of Systems  Constitutional: Negative.   HENT: Negative.    Eyes: Negative.   Respiratory: Negative.    Cardiovascular: Negative.   Gastrointestinal: Negative.   Genitourinary: Negative.   Musculoskeletal: Negative.   Skin: Negative.   Neurological: Negative.   Endo/Heme/Allergies: Negative.   Psychiatric/Behavioral: Negative.      Past Medical History:  Patient Active Problem List   Diagnosis Date Noted   Dysmenorrhea 09/25/2017   Endometriosis 06/15/2015   Migraines 06/15/2015   Anemia 06/15/2015   Avitaminosis D 06/15/2015   Ruptured ovarian cyst 06/15/2015   Family history of colon cancer requiring screening colonoscopy 06/15/2015   Menorrhagia with regular cycle 06/15/2015   Chronic pelvic pain in female 06/09/2015    Past Surgical History:  Past Surgical History:  Procedure Laterality Date    LAPAROSCOPY N/A 06/09/2015   Procedure: LAPAROSCOPY DIAGNOSTIC;  Surgeon: Conard Novak, MD;  Location: [facility] ORS;  Service: Gynecology;  Laterality: N/A;   WISDOM TOOTH EXTRACTION      Gynecologic History:  No LMP recorded. (Menstrual status: Oral contraceptives). Contraception: oral progesterone-only contraceptive Last Pap: Results were: no abnormalities   Obstetric History: G1P1001  Family History:  Family History  Problem Relation Age of Onset   Endometriosis Mother    Colon cancer Paternal Grandmother    Endometriosis Maternal Grandmother    Melanoma Maternal Grandmother    Diabetes Maternal Grandfather    Diabetes Paternal Grandfather    Heart disease Paternal Grandfather     Social History:  Social History   Socioeconomic History   Marital status: Married    Spouse name: Not on file   Number of children: Not on file   Years of education: Not on file   Highest education level: Not on file  Occupational History   Not on file  Tobacco Use   Smoking status: Never   Smokeless tobacco: Never  Vaping Use   Vaping Use: Never used  Substance and Sexual Activity   Alcohol use: Not Currently    Comment: occasional   Drug use: No   Sexual activity: Yes    Birth control/protection: Pill  Other Topics Concern   Not on file  Social History Narrative   Not on file   Social Determinants of Health   Financial Resource Strain: Not on file  Food Insecurity: Not on file  Transportation Needs: Not on file  Physical Activity: Not on file  Stress: Not on file  Social Connections: Not on file  Intimate Partner Violence: Not on file    Allergies:  Allergies  Allergen Reactions   Cefzil [Cefprozil] Anaphylaxis   Prednisone Nausea Only    Medications: Prior to Admission medications   Medication Sig Start Date End Date Taking? Authorizing Provider  norethindrone (MICRONOR) 0.35 MG tablet Take 1 tablet (0.35 mg total) by mouth daily. 10/03/22  Yes [name],  CNM  Prenatal MV-Min-Fe Fum-FA-DHA (PRENATAL 1 PO) Take by mouth.   Yes [provider]    Physical Exam Vitals: Blood pressure 123/80, pulse 66, resp. rate 15, height 5\' 3"  (1.6 m), weight 168 lb 6.4 oz (76.4 kg), currently breastfeeding.  General: NAD HEENT: normocephalic, anicteric Thyroid: no enlargement, no palpable nodules Pulmonary: No increased work of breathing, CTAB Cardiovascular: RRR, distal pulses 2+ Breast: Breast symmetrical, no tenderness, no palpable nodules or masses, no skin or nipple retraction present, no nipple discharge.  No axillary or supraclavicular lymphadenopathy. Abdomen: NABS, soft, non-tender, non-distended.  Umbilicus without lesions.  No hepatomegaly, splenomegaly or masses palpable. No evidence of hernia  Genitourinary:  External: Normal external female genitalia.  Normal urethral meatus, normal Bartholin's and Skene's glands.    Vagina: Normal vaginal mucosa, no evidence of prolapse.    Cervix: Grossly normal in appearance, no bleeding  Uterus: Non-enlarged, mobile, normal contour.  No CMT  Adnexa: ovaries non-enlarged, no adnexal masses  Rectal: deferred  Lymphatic: no evidence of inguinal lymphadenopathy Extremities: no edema, erythema, or tenderness Neurologic: Grossly intact Psychiatric: mood appropriate, affect full  Female chaperone present for pelvic and breast  portions of the physical exam    Assessment: 27 y.o. G1P1001 routine annual exam  Plan: Problem List Items Addressed This Visit   None Visit Diagnoses     Women's annual routine gynecological examination    -  Primary   Cervical cancer screening       Relevant Orders   Cytology - PAP   Encounter for counseling regarding contraception       Relevant Medications   norethindrone-ethinyl estradiol-FE (JUNEL FE 1/20) 1-20 MG-MCG tablet       2) STI screening  wasoffered and declined  2)  ASCCP guidelines and rational discussed.  Patient opts for yearly screening  interval  3) Contraception - the patient is currently using  oral progesterone-only contraceptive.  She is intending to try for another pregnancy early next year. I have prescribed her some Junel OCPs.  4) Routine healthcare maintenance including cholesterol, diabetes screening discussed managed by PCP  5) Return in about 1 year (around 03/10/2024) for annual.   [name], CNM  03/11/2023 5:17 PM

## 2023-03-18 LAB — CYTOLOGY - PAP: Diagnosis: NEGATIVE

## 2023-06-17 ENCOUNTER — Ambulatory Visit: Payer: Self-pay

## 2023-06-17 ENCOUNTER — Ambulatory Visit: Payer: BC Managed Care – PPO | Admitting: Family Medicine

## 2023-06-17 ENCOUNTER — Encounter: Payer: Self-pay | Admitting: Family Medicine

## 2023-06-17 VITALS — BP 114/66 | HR 81 | Ht 63.0 in | Wt 172.0 lb

## 2023-06-17 DIAGNOSIS — R053 Chronic cough: Secondary | ICD-10-CM | POA: Insufficient documentation

## 2023-06-17 DIAGNOSIS — H66003 Acute suppurative otitis media without spontaneous rupture of ear drum, bilateral: Secondary | ICD-10-CM | POA: Insufficient documentation

## 2023-06-17 DIAGNOSIS — H6123 Impacted cerumen, bilateral: Secondary | ICD-10-CM | POA: Diagnosis not present

## 2023-06-17 DIAGNOSIS — R0982 Postnasal drip: Secondary | ICD-10-CM

## 2023-06-17 MED ORDER — CIPROFLOXACIN-DEXAMETHASONE 0.3-0.1 % OT SUSP: 4.0000 [drp] | Freq: Two times a day (BID) | OTIC | 0 refills | Status: DC

## 2023-06-17 MED ORDER — FLUTICASONE PROPIONATE 50 MCG/ACT NA SUSP: 2.0000 | Freq: Every day | NASAL | 6 refills | Status: DC

## 2023-06-17 MED ORDER — AMOXICILLIN-POT CLAVULANATE 875-125 MG PO TABS: 1.0000 | ORAL_TABLET | Freq: Two times a day (BID) | ORAL | 0 refills | Status: DC

## 2023-06-17 NOTE — Assessment & Plan Note (Signed)
Recommend topical inner ear gtts to assist; f/u as needed

## 2023-06-17 NOTE — Patient Instructions (Signed)
Use of topical inner ear antibiotic to assist with ongoing inflammation   Oral antibiotic to assist with generalized symptoms including post nasal drip and cough

## 2023-06-17 NOTE — Progress Notes (Signed)
Established patient visit   Patient: Kristen Galloway   DOB: [birth year]   27 y.o. Female  MRN: [id number] Visit Date: 06/17/2023  Today's healthcare provider: Jacky Kindle, FNP  Introduced to nurse practitioner role and practice setting.  All questions answered.  Discussed provider/patient relationship and expectations.  Subjective    HPI HPI     Medical Management of Chronic Issues    Additional comments: Consistent cough, started almost 2 weeks ago. Coughing up mucus a week ago and still occurring, no prior medication due to breast feeding      Last edited by Rolly Salter, CMA on 06/17/2023 11:11 AM.      Medications: Outpatient Medications Prior to Visit  Medication Sig   norethindrone-ethinyl estradiol-FE (JUNEL FE 1/20) 1-20 MG-MCG tablet Take 1 tablet by mouth daily.   Prenatal MV-Min-Fe Fum-FA-DHA (PRENATAL 1 PO) Take by mouth.   No facility-administered medications prior to visit.    Review of Systems    Objective    BP 114/66 (BP Location: Right Arm, Patient Position: Sitting, Cuff Size: Normal)   Pulse 81   Ht 5\' 3"  (1.6 m)   Wt 172 lb (78 kg)   SpO2 98%   BMI 30.47 kg/m   Physical Exam Vitals and nursing note reviewed.  Constitutional:      General: She is not in acute distress.    Appearance: Normal appearance. She is obese. She is not ill-appearing, toxic-appearing or diaphoretic.  HENT:     Head: Normocephalic and atraumatic.     Right Ear: There is impacted cerumen.     Left Ear: There is impacted cerumen.     Nose: Congestion present.     Mouth/Throat:     Mouth: Mucous membranes are moist.     Pharynx: Oropharynx is clear. No oropharyngeal exudate or posterior oropharyngeal erythema.  Eyes:     Extraocular Movements: Extraocular movements intact.     Conjunctiva/sclera: Conjunctivae normal.  Cardiovascular:     Rate and Rhythm: Normal rate and regular rhythm.     Pulses: Normal pulses.     Heart sounds: Normal heart  sounds. No murmur heard.    No friction rub. No gallop.  Pulmonary:     Effort: Pulmonary effort is normal. No respiratory distress.     Breath sounds: Normal breath sounds. No stridor. No wheezing, rhonchi or rales.  Chest:     Chest wall: No tenderness.  Musculoskeletal:        General: No swelling, tenderness, deformity or signs of injury. Normal range of motion.     Cervical back: Neck supple.     Right lower leg: No edema.     Left lower leg: No edema.  Skin:    General: Skin is warm and dry.     Capillary Refill: Capillary refill takes less than 2 seconds.     Coloration: Skin is not jaundiced or pale.     Findings: No bruising, erythema, lesion or rash.  Neurological:     General: No focal deficit present.     Mental Status: She is alert and oriented to person, place, and time. Mental status is at baseline.     Cranial Nerves: No cranial nerve deficit.     Sensory: No sensory deficit.     Motor: No weakness.     Coordination: Coordination normal.  Psychiatric:        Mood and Affect: Mood normal.        Behavior: Behavior  normal.        Thought Content: Thought content normal.        Judgment: Judgment normal.     No results found for any visits on 06/17/23.  Assessment & Plan     Problem List Items Addressed This Visit       Nervous and Auditory   Excessive cerumen in both ear canals    Irrigation provided with minimal relief Continue to monitor following start of flonase       Non-recurrent acute suppurative otitis media of both ears without spontaneous rupture of tympanic membranes    Recommend topical inner ear gtts to assist; f/u as needed       Relevant Medications   amoxicillin-clavulanate (AUGMENTIN) 875-125 MG tablet     Other   Persistent cough for 3 weeks or longer - Primary    Hx of bronchitis; lungs remain clear to auscultation Continue to monitor hydration and concern for PND in setting of acute allergen vs respiratory illness exposure        Post-nasal drip    Acute x3 weeks with illness exposure to both son and spouse Pt also was traveling Denies c/f COVID related illness; outside of testing window at this time       Return if symptoms worsen or fail to improve.     Leilani Merl, FNP, have reviewed all documentation for this visit. The documentation on 06/17/23 for the exam, diagnosis, procedures, and orders are all accurate and complete.  Jacky Kindle, FNP  [facility] [phone] (phone) [phone] (fax)  [facility]

## 2023-06-17 NOTE — Telephone Encounter (Signed)
Chief Complaint: Medication Question  Disposition: [] ED /[] Urgent Care (no appt availability in office) / [] Appointment(In office/virtual)/ []  [facility]/ [] Home Care/ [] Refused Recommended Disposition /[] [facility]/ [x]  Follow-up with PCP Additional Notes: Patient was prescribed Augmentin today by PCP but when she went to pick it up from the pharmacy, the pharmacist advised not taking it due to her anaphylaxis reaction to Cefzil. Cefzil and Amoxicillin are closely related and can have the same reaction. The patient is requesting a new Rx be sent to her pharmacy that she can take while breastfeeding. Patient would like Rx to go to [facility] in [place]. Forwarding to provider for recommendations.  Summary: Medication Advice   Pt is calling to report that she is advised by her pharmacists that medication amoxicillin-clavulanate (AUGMENTIN) 875-125 MG tablet [[id number]] could cause an allergic reaction. Please advise    [facility] in [place]  Reason for Disposition  [1] Caller has NON-URGENT medicine question about med that PCP prescribed AND [2] triager unable to answer question  Answer Assessment - Initial Assessment Questions 1. NAME of MEDICINE: "What medicine(s) are you calling about?"     Amoxicillin-clavulanate (AUGMENTIN) 875-125 MG tablet [[id number]] 2. QUESTION: "What is your question?" (e.g., double dose of medicine, side effect)     Pharmacist did not dispense the medication due to patient having an severe allergic reaction to Cefzil. Can I have a different antibiotic that is breastfeeding friendly.  3. PRESCRIBER: "Who prescribed the medicine?" Reason: if prescribed by specialist, call should be referred to that group.     Robynn Pane, NP 4. SYMPTOMS: "Do you have any symptoms?" If Yes, ask: "What symptoms are you having?"  "How bad are the symptoms (e.g., mild, moderate, severe)     No  Protocols used: Medication Question Call-A-AH

## 2023-06-17 NOTE — Assessment & Plan Note (Signed)
Irrigation provided with minimal relief Continue to monitor following start of flonase

## 2023-06-17 NOTE — Assessment & Plan Note (Signed)
Hx of bronchitis; lungs remain clear to auscultation Continue to monitor hydration and concern for PND in setting of acute allergen vs respiratory illness exposure

## 2023-06-17 NOTE — Assessment & Plan Note (Signed)
Acute x3 weeks with illness exposure to both son and spouse Pt also was traveling Denies c/f COVID related illness; outside of testing window at this time

## 2023-06-18 ENCOUNTER — Other Ambulatory Visit: Payer: Self-pay | Admitting: Family Medicine

## 2023-06-18 MED ORDER — AZITHROMYCIN 500 MG PO TABS: 500.0000 mg | ORAL_TABLET | Freq: Every day | ORAL | 0 refills | Status: DC

## 2024-01-20 ENCOUNTER — Telehealth: Payer: Self-pay

## 2024-01-21 ENCOUNTER — Encounter: Payer: Self-pay | Admitting: Licensed Practical Nurse

## 2024-02-21 NOTE — Telephone Encounter (Signed)
 Patient states she has enough norethindrone -ethinyl estradiol-FE (JUNEL FE 1/20) 1-20 MG-MCG tablet to last to her appointment. No refill needed at this time.

## 2024-03-12 ENCOUNTER — Other Ambulatory Visit (HOSPITAL_COMMUNITY)
Admission: RE | Admit: 2024-03-12 | Discharge: 2024-03-12 | Disposition: A | Discharge status: Home / Self Care | Source: Ambulatory Visit | Attending: Licensed Practical Nurse | Admitting: Licensed Practical Nurse

## 2024-03-12 ENCOUNTER — Encounter: Payer: Self-pay | Admitting: Licensed Practical Nurse

## 2024-03-12 ENCOUNTER — Ambulatory Visit (INDEPENDENT_AMBULATORY_CARE_PROVIDER_SITE_OTHER): Payer: BC Managed Care – PPO | Admitting: Licensed Practical Nurse

## 2024-03-12 VITALS — BP 117/78 | HR 70 | Ht 63.0 in | Wt 180.0 lb

## 2024-03-12 DIAGNOSIS — Z3169 Encounter for other general counseling and advice on procreation: Secondary | ICD-10-CM

## 2024-03-12 DIAGNOSIS — Z131 Encounter for screening for diabetes mellitus: Secondary | ICD-10-CM

## 2024-03-12 DIAGNOSIS — Z124 Encounter for screening for malignant neoplasm of cervix: Secondary | ICD-10-CM

## 2024-03-12 DIAGNOSIS — Z01419 Encounter for gynecological examination (general) (routine) without abnormal findings: Secondary | ICD-10-CM

## 2024-03-12 DIAGNOSIS — Z13220 Encounter for screening for lipoid disorders: Secondary | ICD-10-CM

## 2024-03-12 NOTE — Progress Notes (Signed)
 Gynecology Annual Exam   PCP: Normie Becton, FNP  Chief Complaint:  Chief Complaint  Patient presents with   Gynecologic Exam    History of Present Illness: Patient is a 28 y.o. G1P1001 presents for annual exam. The patient has no complaints today.   LMP: Patient's last menstrual period was 02/10/2024 (exact date). Average Interval: regular, 28 days Duration of flow: 6 days Heavy Menses: yes, changes pad every 1.5-2 hours  Clots: yes Intermenstrual Bleeding: no Postcoital Bleeding: no Dysmenorrhea: yes  The patient is sexually active with 1 female partner. She currently uses none for contraception. She denies dyspareunia.  The patient does perform self breast exams.  There is no notable family history of breast or ovarian cancer in her family.  The patient wears seatbelts: yes.   The patient has regular exercise: yes.  Is interested in losing weight, has a routine. [employer] aerobics   The patient reports current symptoms of depression.  Hx of PTSD, anxiety and depression-doing well now, no medications   Runs a catering and events center Lives with her husband and their 18.42 year old, feels safe PCP sees only when sick Dentist last seen 1 week ago Wears contacts, eye exam up to date   Review of Systems: ROSsee HPI   Past Medical History:  Patient Active Problem List   Diagnosis Date Noted Date Diagnosed   Non-recurrent acute suppurative otitis media of both ears without spontaneous rupture of tympanic membranes 06/17/2023    Post-nasal drip 06/17/2023    Excessive cerumen in both ear canals 06/17/2023    Persistent cough for 3 weeks or longer 06/17/2023    Dysmenorrhea 09/25/2017    Endometriosis 06/15/2015    Migraines 06/15/2015    Anemia 06/15/2015    Avitaminosis D 06/15/2015    Ruptured ovarian cyst 06/15/2015    Family history of colon cancer requiring screening colonoscopy 06/15/2015    Menorrhagia with regular cycle 06/15/2015    Chronic pelvic pain in  female 06/09/2015     Past Surgical History:  Past Surgical History:  Procedure Laterality Date   LAPAROSCOPY N/A 06/09/2015   Procedure: LAPAROSCOPY DIAGNOSTIC;  Surgeon: Kris Pester, MD;  Location: [facility] ORS;  Service: Gynecology;  Laterality: N/A;   WISDOM TOOTH EXTRACTION      Gynecologic History:  Patient's last menstrual period was 02/10/2024 (exact date). Contraception: none Last Pap: Results were: no abnormalities 2024  Obstetric History: G1P1001  Family History:  Family History  Problem Relation Age of Onset   Endometriosis Mother    Colon cancer Paternal Grandmother    Endometriosis Maternal Grandmother    Melanoma Maternal Grandmother    Diabetes Maternal Grandfather    Diabetes Paternal Grandfather    Heart disease Paternal Grandfather     Social History:  Social History   Socioeconomic History   Marital status: Married    Spouse name: Not on file   Number of children: Not on file   Years of education: Not on file   Highest education level: Not on file  Occupational History   Not on file  Tobacco Use   Smoking status: Never   Smokeless tobacco: Never  Vaping Use   Vaping status: Never Used  Substance and Sexual Activity   Alcohol use: Not Currently    Comment: occasional   Drug use: No   Sexual activity: Yes    Birth control/protection: None  Other Topics Concern   Not on file  Social History Narrative  Not on file   Social Drivers of Health   Financial Resource Strain: Not on file  Food Insecurity: Not on file  Transportation Needs: Not on file  Physical Activity: Not on file  Stress: Not on file  Social Connections: Not on file  Intimate Partner Violence: Not on file    Allergies:  Allergies  Allergen Reactions   Cefzil [Cefprozil] Anaphylaxis   Prednisone Nausea Only    Medications: Prior to Admission medications   Medication Sig Start Date End Date Taking? Authorizing Provider  Prenatal MV-Min-Fe Fum-FA-DHA (PRENATAL 1  PO) Take by mouth.   Yes [provider]   Co enzyme Q 10 Acetyl choline  R Lipoic  Vitamin C    Physical Exam Vitals: Blood pressure 117/78, pulse 70, height 5\' 3"  (1.6 m), weight 180 lb (81.6 kg), last menstrual period 02/10/2024, currently breastfeeding.  General: NAD HEENT: normocephalic, anicteric Thyroid : no enlargement, no palpable nodules Pulmonary: No increased work of breathing, CTAB Cardiovascular: RRR, distal pulses 2+ Breast: Breast symmetrical, no tenderness, no palpable nodules or masses, no skin or nipple retraction present, no nipple discharge.  No axillary or supraclavicular lymphadenopathy. Abdomen: NABS, soft, non-tender, non-distended.  Umbilicus without lesions.  No hepatomegaly, splenomegaly or masses palpable. No evidence of hernia  Genitourinary:  External: Normal external female genitalia.  Normal urethral meatus, normal Bartholin's and Skene's glands.    Vagina: Normal vaginal mucosa, no evidence of prolapse.  Some tone   Cervix: Grossly normal in appearance, no bleeding  Uterus: Non-enlarged, mobile, normal contour.  No CMT  Adnexa: ovaries non-enlarged, no adnexal masses  Rectal: deferred  Lymphatic: no evidence of inguinal lymphadenopathy Extremities: no edema, erythema, or tenderness Neurologic: Grossly intact Psychiatric: mood appropriate, affect full   Assessment: 28 y.o. G1P1001 routine annual exam  Plan: Problem List Items Addressed This Visit   None Visit Diagnoses       Well woman exam    -  Primary   Relevant Orders   Cytology - PAP   CBC w/Diff/Platelet   Rubella screen   Varicella zoster antibody, IgG   Hemoglobin A1c   Lipid panel     Cervical cancer screening       Relevant Orders   Cytology - PAP     Screening for diabetes mellitus       Relevant Orders   Hemoglobin A1c     Screening for cholesterol level       Relevant Orders   Lipid panel     Encounter for preconception consultation       Relevant Orders    Rubella screen   Varicella zoster antibody, IgG       2) STI screening  wasoffered and declined  2)  ASCCP guidelines and rational discussed.  Patient opts for yearly screening interval  3) Contraception - the patient is currently using  none.  She is attempting to conceive in the near future  4) Routine healthcare maintenance including cholesterol, diabetes screening discussed Ordered today  5) RTC once pregnant  Anice Kerbs, CNM  [facility] 03/12/2024, 1:59 PM

## 2024-03-13 LAB — CBC WITH DIFFERENTIAL/PLATELET
Basophils Absolute: 0 10*3/uL (ref 0.0–0.2)
Basos: 1 %
EOS (ABSOLUTE): 0.1 10*3/uL (ref 0.0–0.4)
Eos: 1 %
Hematocrit: 39.5 % (ref 34.0–46.6)
Hemoglobin: 13.1 g/dL (ref 11.1–15.9)
Immature Grans (Abs): 0 10*3/uL (ref 0.0–0.1)
Immature Granulocytes: 0 %
Lymphocytes Absolute: 2.5 10*3/uL (ref 0.7–3.1)
Lymphs: 34 %
MCH: 26.4 pg — ABNORMAL LOW (ref 26.6–33.0)
MCHC: 33.2 g/dL (ref 31.5–35.7)
MCV: 80 fL (ref 79–97)
Monocytes Absolute: 0.5 10*3/uL (ref 0.1–0.9)
Monocytes: 7 %
Neutrophils Absolute: 4.1 10*3/uL (ref 1.4–7.0)
Neutrophils: 57 %
Platelets: 316 10*3/uL (ref 150–450)
RBC: 4.96 x10E6/uL (ref 3.77–5.28)
RDW: 13.9 % (ref 11.7–15.4)
WBC: 7.3 10*3/uL (ref 3.4–10.8)

## 2024-03-13 LAB — LIPID PANEL
Chol/HDL Ratio: 4.5 ratio — ABNORMAL HIGH (ref 0.0–4.4)
Cholesterol, Total: 178 mg/dL (ref 100–199)
HDL: 40 mg/dL (ref >39)
LDL Chol Calc (NIH): 117 mg/dL — ABNORMAL HIGH (ref 0–99)
Triglycerides: 114 mg/dL (ref 0–149)
VLDL Cholesterol Cal: 21 mg/dL (ref 5–40)

## 2024-03-13 LAB — VARICELLA ZOSTER ANTIBODY, IGG: Varicella zoster IgG: REACTIVE

## 2024-03-13 LAB — HEMOGLOBIN A1C
Est. average glucose Bld gHb Est-mCnc: 97 mg/dL
Hgb A1c MFr Bld: 5 % (ref 4.8–5.6)

## 2024-03-13 LAB — RUBELLA SCREEN: Rubella Antibodies, IGG: 5.02 {index} (ref >0.99)

## 2024-03-18 LAB — CYTOLOGY - PAP
Adequacy: ABSENT
Diagnosis: NEGATIVE

## 2024-04-17 DIAGNOSIS — D2261 Melanocytic nevi of right upper limb, including shoulder: Secondary | ICD-10-CM | POA: Diagnosis not present

## 2024-04-17 DIAGNOSIS — D2272 Melanocytic nevi of left lower limb, including hip: Secondary | ICD-10-CM | POA: Diagnosis not present

## 2024-04-17 DIAGNOSIS — D2262 Melanocytic nevi of left upper limb, including shoulder: Secondary | ICD-10-CM | POA: Diagnosis not present

## 2024-04-17 DIAGNOSIS — D225 Melanocytic nevi of trunk: Secondary | ICD-10-CM | POA: Diagnosis not present

## 2024-06-19 ENCOUNTER — Encounter: Payer: Self-pay | Admitting: Licensed Practical Nurse

## 2024-06-22 ENCOUNTER — Other Ambulatory Visit: Payer: Self-pay | Admitting: Licensed Practical Nurse

## 2024-06-22 DIAGNOSIS — N979 Female infertility, unspecified: Secondary | ICD-10-CM

## 2024-06-22 NOTE — Progress Notes (Signed)
 Pt TTC x 6 months, requesting referral to [facility], referral placed Jinnie Cookey, CNM  [facility] 06/22/24  9:57 AM

## 2024-07-03 DIAGNOSIS — Z3169 Encounter for other general counseling and advice on procreation: Secondary | ICD-10-CM | POA: Diagnosis not present

## 2024-07-16 DIAGNOSIS — E559 Vitamin D deficiency, unspecified: Secondary | ICD-10-CM | POA: Diagnosis not present

## 2024-07-16 DIAGNOSIS — Z319 Encounter for procreative management, unspecified: Secondary | ICD-10-CM | POA: Diagnosis not present

## 2024-07-17 LAB — VITAMIN D 25 HYDROXY (VIT D DEFICIENCY, FRACTURES): Vit D, 25-Hydroxy: 32.52

## 2024-07-17 LAB — TSH: TSH: 2.49 (ref 0.41–5.90)

## 2024-07-24 DIAGNOSIS — Z3200 Encounter for pregnancy test, result unknown: Secondary | ICD-10-CM | POA: Diagnosis not present

## 2024-07-31 ENCOUNTER — Ambulatory Visit

## 2024-07-31 DIAGNOSIS — Z3200 Encounter for pregnancy test, result unknown: Secondary | ICD-10-CM | POA: Diagnosis not present

## 2024-08-10 ENCOUNTER — Telehealth

## 2024-08-17 ENCOUNTER — Telehealth

## 2024-08-17 DIAGNOSIS — Z3480 Encounter for supervision of other normal pregnancy, unspecified trimester: Secondary | ICD-10-CM

## 2024-08-17 DIAGNOSIS — Z3687 Encounter for antenatal screening for uncertain dates: Secondary | ICD-10-CM

## 2024-08-17 DIAGNOSIS — Z3689 Encounter for other specified antenatal screening: Secondary | ICD-10-CM

## 2024-08-17 NOTE — Patient Instructions (Signed)
 First Trimester of Pregnancy  The first trimester of pregnancy starts on the first day of your last monthly period until the end of week 13. This is months 1 through 3 of pregnancy. A week after a sperm fertilizes an egg, the egg will implant into the wall of the uterus and begin to develop into a baby. Body changes during your first trimester Your body goes through many changes during pregnancy. The changes usually return to normal after your baby is born. Physical changes Your breasts may grow larger and may hurt. The area around your nipples may get darker. Your periods will stop. Your hair and nails may grow faster. You may pee more often. Health changes You may tire easily. Your gums may bleed and may be sensitive when you brush and floss. You may not feel hungry. You may have heartburn. You may throw up or feel like you may throw up. You may want to eat some foods, but not others. You may have headaches. You may have trouble pooping (constipation). Other changes Your emotions may change from day to day. You may have more dreams. Follow these instructions at home: Medicines Talk to your health care provider if you're taking medicines. Ask if the medicines are safe to take during pregnancy. Your provider may change the medicines that you take. Do not take any medicines unless told to by your provider. Take a prenatal vitamin that has at least 600 micrograms (mcg) of folic acid. Do not use herbal medicines, illegal substances, or medicines that are not approved by your provider. Eating and drinking While you're pregnant your body needs extra food for your growing baby. Talk with your provider about what to eat while pregnant. Activity Most women are able to exercise during pregnancy. Exercises may need to change as your pregnancy goes on. Talk to your provider about your activities and exercise routines. Relieving pain and discomfort Wear a good, supportive bra if your breasts  hurt. Rest with your legs raised if you have leg cramps or low back pain. Safety Wear your seatbelt at all times when you're in a car. Talk to your provider if someone hits you, hurts you, or yells at you. Talk with your provider if you're feeling sad or have thoughts of hurting yourself. Lifestyle Certain things can be harmful while you're pregnant. Follow these rules: Do not use hot tubs, steam rooms, or saunas. Do not douche. Do not use tampons or scented pads. Do not drink alcohol,smoke, vape, or use products with nicotine or tobacco in them. If you need help quitting, talk with your provider. Avoid cat litter boxes and soil used by cats. These things carry germs that can cause harm to your pregnancy and your baby. General instructions Keep all follow-up visits. It helps you and your unborn baby stay as healthy as possible. Write down your questions. Take them to your visits. Your provider will: Talk with you about your overall health. Give you advice or refer you to specialists who can help with different needs, including: Prenatal education classes. Mental health and counseling. Foods and healthy eating. Ask for help if you need help with food. Call your dentist and ask to be seen. Brush your teeth with a soft toothbrush. Floss gently. Where to find more information American Pregnancy Association: americanpregnancy.org [employer]: acog.org Office on [employer] Health: [employer] Contact a health care provider if: You feel dizzy, faint, or have a fever. You vomit or have watery poop (diarrhea) for 2  days or more. You have abnormal discharge or bleeding from your vagina. You have pain when you pee or your pee smells bad. You have cramps, pain, or pressure in your belly area. Get help right away if: You have trouble breathing or chest pain. You have any kind of injury, such as from a fall or a car crash. These symptoms may be an  emergency. Get help right away. Call 911. Do not wait to see if the symptoms will go away. Do not drive yourself to the hospital. This information is not intended to replace advice given to you by your health care provider. Make sure you discuss any questions you have with your health care provider. Document Revised: 07/18/2023 Document Reviewed: 02/15/2023 Elsevier Patient Education  2024 Elsevier Inc.  Commonly Asked Questions During Pregnancy  Cats: A parasite can be excreted in cat feces.  To avoid exposure you need to have another person empty the little box.  If you must empty the litter box you will need to wear gloves.  Wash your hands after handling your cat.  This parasite can also be found in raw or undercooked meat so this should also be avoided.  Colds, Sore Throats, Flu: Please check your medication sheet to see what you can take for symptoms.  If your symptoms are unrelieved by these medications please call the office.  Dental Work: Most any dental work Agricultural consultant recommends is permitted.  X-rays should only be taken during the first trimester if absolutely necessary.  Your abdomen should be shielded with a lead apron during all x-rays.  Please notify your provider prior to receiving any x-rays.  Novocaine is fine; gas is not recommended.  If your dentist requires a note from us  prior to dental work please call the office and we will provide one for you.  Exercise: Exercise is an important part of staying healthy during your pregnancy.  You may continue most exercises you were accustomed to prior to pregnancy.  Later in your pregnancy you will most likely notice you have difficulty with activities requiring balance like riding a bicycle.  It is important that you listen to your body and avoid activities that put you at a higher risk of falling.  Adequate rest and staying well hydrated are a must!  If you have questions about the safety of specific activities ask your provider.     Exposure to Children with illness: Try to avoid obvious exposure; report any symptoms to us  when noted,  If you have chicken pos, red measles or mumps, you should be immune to these diseases.   Please do not take any vaccines while pregnant unless you have checked with your OB provider.  Fetal Movement: After 28 weeks we recommend you do kick counts twice daily.  Lie or sit down in a calm quiet environment and count your baby movements kicks.  You should feel your baby at least 10 times per hour.  If you have not felt 10 kicks within the first hour get up, walk around and have something sweet to eat or drink then repeat for an additional hour.  If count remains less than 10 per hour notify your provider.  Fumigating: Follow your pest control agent's advice as to how long to stay out of your home.  Ventilate the area well before re-entering.  Hemorrhoids:   Most over-the-counter preparations can be used during pregnancy.  Check your medication to see what is safe to use.  It is important to  use a stool softener or fiber in your diet and to drink lots of liquids.  If hemorrhoids seem to be getting worse please call the office.   Hot Tubs:  Hot tubs Jacuzzis and saunas are not recommended while pregnant.  These increase your internal body temperature and should be avoided.  Intercourse:  Sexual intercourse is safe during pregnancy as long as you are comfortable, unless otherwise advised by your provider.  Spotting may occur after intercourse; report any bright red bleeding that is heavier than spotting.  Labor:  If you know that you are in labor, please go to the hospital.  If you are unsure, please call the office and let us  help you decide what to do.  Lifting, straining, etc:  If your job requires heavy lifting or straining please check with your provider for any limitations.  Generally, you should not lift items heavier than that you can lift simply with your hands and arms (no back  muscles)  Painting:  Paint fumes do not harm your pregnancy, but may make you ill and should be avoided if possible.  Latex or water based paints have less odor than oils.  Use adequate ventilation while painting.  Permanents & Hair Color:  Chemicals in hair dyes are not recommended as they cause increase hair dryness which can increase hair loss during pregnancy.   Highlighting and permanents are allowed.  Dye may be absorbed differently and permanents may not hold as well during pregnancy.  Sunbathing:  Use a sunscreen, as skin burns easily during pregnancy.  Drink plenty of fluids; avoid over heating.  Tanning Beds:  Because their possible side effects are still unknown, tanning beds are not recommended.  Ultrasound Scans:  Routine ultrasounds are performed at approximately 20 weeks.  You will be able to see your baby's general anatomy an if you would like to know the gender this can usually be determined as well.  If it is questionable when you conceived you may also receive an ultrasound early in your pregnancy for dating purposes.  Otherwise ultrasound exams are not routinely performed unless there is a medical necessity.  Although you can request a scan we ask that you pay for it when conducted because insurance does not cover  patient request scans.  Work: If your pregnancy proceeds without complications you may work until your due date, unless your physician or employer advises otherwise.  Round Ligament Pain/Pelvic Discomfort:  Sharp, shooting pains not associated with bleeding are fairly common, usually occurring in the second trimester of pregnancy.  They tend to be worse when standing up or when you remain standing for long periods of time.  These are the result of pressure of certain pelvic ligaments called round ligaments.  Rest, Tylenol  and heat seem to be the most effective relief.  As the womb and fetus grow, they rise out of the pelvis and the discomfort improves.  Please  notify the office if your pain seems different than that described.  It may represent a more serious condition.  Tests and Screening During Pregnancy Tests and screenings during pregnancy are an important part of your prenatal care. These tests help your health care provider find any problems that might affect your pregnancy. Some tests need to be done for all pregnant people, and some are optional. Most of the tests and screenings do not pose any risks for you or your baby. You may need more testing if a test result shows there is a risk to your  health or your baby's health. Tests and screenings done early in pregnancy Some tests and screenings you may have in early pregnancy are: Blood tests, such as: Complete blood count (CBC). Blood typing. Tests to check for diseases that can cause birth defects or can be passed to your baby, such as: [place] measles (rubella( and chicken pox. Hepatitis B and C. Human Immunodeficiency Virus (HIV). Syphilis. Zika virus. Pee tests. Blood pressure. Testing for sexually transmitted infections (STIs), such as chlamydia or gonorrhea. Testing for tuberculosis. Ultrasound. Tests and screenings done later in pregnancy Some common tests you can expect to have later in pregnancy include: Rh antibody testing. Pee and blood tests. Glucose screening. This checks your blood sugar. It will show whether you are developing the type of diabetes that happens during pregnancy, called gestational diabetes. You may have this screening earlier if you have risk factors for diabetes. Ultrasound. This may be repeated at 16-20 weeks to check how your baby is growing. Screening for group B streptococcus (GBS). GBS is a type of bacteria that may live in your rectum or vagina. GBS can spread to your baby during birth. This test is done at 35-37 weeks of pregnancy. Non-stress test. This may be done more often if your pregnancy is high risk. Biophysical profile. This test includes  ultrasound imaging and a non-stress test to check to see if your baby is healthy. This test may help decide when your baby should be born. Screening for birth defects Early in your pregnancy, tests can be done to find out if your baby is at risk for a genetic disorder. This testing is optional. The type of testing recommended for you will depend on your family and medical history, your ethnicity, and your age. Testing may include: Screening tests such as ultrasound, blood tests, or a combination of both. Carrier screening. If genetic screening shows that your baby is at risk for a genetic defect, diagnostic testing may be recommended, such as: Amniocentesis. Chorionic villus sampling. Unlike other tests done during pregnancy, diagnostic testing does have some risk for your pregnancy. Talk to your provider about the risks and benefits of genetic testing. Questions to ask your health care provider What tests are recommended for me? When and how will these tests be done? When will I get the results of the tests? What do the results of these tests mean for me or my baby? Do you recommend any genetic screening tests? Which ones? Should I see a genetic counselor before having genetic screening? Where to find more information Go to americanpregnancy.org Click on search. Type 'prenatal tests in the search box. Go to TravelLesson.ca Click on search. Type 'prenatal tests in the search box. Go to acog.org Click on search. Type routine tests in the search box. This information is not intended to replace advice given to you by your health care provider. Make sure you discuss any questions you have with your health care provider. Document Revised: 08/13/2023 Document Reviewed: 08/13/2023 Elsevier Patient Education  2025 [employer].  Common Medications Safe in Pregnancy  Acne:      Constipation:  Benzoyl Peroxide     Colace  Clindamycin      Dulcolax Suppository  Topica  Erythromycin     Fibercon  Salicylic Acid      Metamucil         Miralax AVOID:        Senakot   Accutane    Cough:  Retin-A       Cough Drops  Tetracycline      Phenergan  w/ Codeine  if Rx  Minocycline      Robitussin (Plain & DM)  Antibiotics:     Crabs/Lice:  Ceclor       RID  Cephalosporins    AVOID:  E-Mycins      Kwell  Keflex   Macrobid/Macrodantin   Diarrhea:  Penicillin      Kao-Pectate  Zithromax       Imodium AD         PUSH FLUIDS AVOID:       Cipro     Fever:  Tetracycline      Tylenol  (Regular or Extra  Minocycline       Strength)  Levaquin      Extra Strength-Do not          Exceed 8 tabs/24 hrs Caffeine:        200mg /day (equiv. To 1 cup of coffee or  approx. 3 12 oz sodas)         Gas: Cold/Hayfever:       Gas-X  Benadryl       Mylicon  Claritin       Phazyme  **Claritin-D        Chlor-Trimeton    Headaches:  Dimetapp      ASA-Free Excedrin  Drixoral-Non-Drowsy     Cold Compress  Mucinex (Guaifenasin)     Tylenol  (Regular or Extra  Sudafed/Sudafed-12 Hour     Strength)  **Sudafed PE Pseudoephedrine   Tylenol  Cold & Sinus     Vicks Vapor Rub  Zyrtec  **AVOID if Problems With Blood Pressure         Heartburn: Avoid lying down for at least 1 hour after meals  Aciphex      Maalox     Rash:  Milk of Magnesia     Benadryl     Mylanta       1% Hydrocortisone Cream  Pepcid  Pepcid Complete   Sleep Aids:  Prevacid      Ambien   Prilosec       Benadryl   Rolaids       Chamomile Tea  Tums (Limit 4/day)     Unisom         Tylenol  PM         Warm milk-add vanilla or  Hemorrhoids:       Sugar for taste  Anusol/Anusol H.C.  (RX: Analapram 2.5%)  Sugar Substitutes:  Hydrocortisone OTC     Ok in moderation  Preparation H      Tucks        Vaseline lotion applied to tissue with wiping    Herpes:     Throat:  Acyclovir      Oragel  Famvir  Valtrex     Vaccines:         Flu Shot Leg Cramps:       *Gardasil  Benadryl       Hepatitis  A         Hepatitis B Nasal Spray:       Pneumovax  Saline Nasal Spray     Polio Booster         Tetanus Nausea:       Tuberculosis test or PPD  Vitamin B6 25 mg TID   AVOID:    Dramamine      *Gardasil  Emetrol       Live Poliovirus  Ginger Root 250 mg QID    MMR (measles, mumps &  High Complex Carbs @  Bedtime    rebella)  Sea Bands-Accupressure    Varicella (Chickenpox)  Unisom 1/2 tab TID     *No known complications           If received before Pain:         Known pregnancy;   Darvocet       Resume series after  Lortab        Delivery  Percocet    Yeast:   Tramadol       Femstat  Tylenol  3      Gyne-lotrimin   Ultram        Monistat  Vicodin           MISC:         All Sunscreens           Hair Coloring/highlights          Insect Repellant's          (Including DEET)         Mystic Tans

## 2024-08-17 NOTE — Progress Notes (Signed)
 New OB Intake  I connected with  Kristen Galloway on 08/17/24 at  8:15 AM EDT by MyChart Video Visit and verified that I am speaking with the correct person using two identifiers. Nurse is located at [facility] and pt is located at work.  I discussed the limitations, risks, security and privacy concerns of performing an evaluation and management service by telephone and the availability of in person appointments. I also discussed with the patient that there may be a patient responsible charge related to this service. The patient expressed understanding and agreed to proceed.  I explained I am completing New OB Intake today. We discussed her EDD of 03/26/25 that is based on LMP of 06/19/24. Pt is G2/P1. I reviewed her allergies, medications, Medical/Surgical/OB history, and appropriate screenings. There are cats in the home: no.  Based on history, this is a/an pregnancy uncomplicated . Her obstetrical history is significant for none.  Patient Active Problem List   Diagnosis Date Noted   Non-recurrent acute suppurative otitis media of both ears without spontaneous rupture of tympanic membranes 06/17/2023   Post-nasal drip 06/17/2023   Excessive cerumen in both ear canals 06/17/2023   Persistent cough for 3 weeks or longer 06/17/2023   Supervision of other normal pregnancy, antepartum 11/15/2021   Dysmenorrhea 09/25/2017   Endometriosis 06/15/2015   Migraines 06/15/2015   Anemia 06/15/2015   Avitaminosis D 06/15/2015   Ruptured ovarian cyst 06/15/2015   Family history of colon cancer requiring screening colonoscopy 06/15/2015   Menorrhagia with regular cycle 06/15/2015   Chronic pelvic pain in female 06/09/2015    Concerns addressed today: Discussed when is appropriate to receive flu vaccine.    Delivery Plans:  Plans to deliver at [facility].  Anatomy US  Explained first scheduled US  will be 10 weeks ( patient reports that she had a U/S at 8 weeks with  [facility]). Anatomy US  will be scheduled around [redacted] weeks gestational age.  Labs Discussed genetic screening with patient. Patient plans on getting genetic testing to be drawn at new OB visit. Discussed possible labs to be drawn at new OB appointment.  COVID Vaccine Patient has had COVID vaccine.   Social Determinants of Health Food Insecurity: denies food insecurity WIC Referral: Patient is not interested in referral to [facility].  Transportation: Patient denies transportation needs. Childcare: Discussed no children allowed at ultrasound appointments.   First visit review I reviewed new OB appt with pt. I explained she will have blood work and pap smear/pelvic exam if indicated. Explained pt will be seen by Eleanor Canny at first visit; encounter routed to appropriate provider.   Rollo JINNY Maxin, CMA 08/17/2024  8:59 AM

## 2024-08-31 ENCOUNTER — Other Ambulatory Visit

## 2024-09-07 NOTE — Progress Notes (Unsigned)
 NEW OB HISTORY AND PHYSICAL  SUBJECTIVE:       Kristen Galloway is a 28 y.o. G53P1001 female, Patient's last menstrual period was 06/19/2024., Estimated Date of Delivery: 03/26/25, [redacted]w[redacted]d, presents today for establishment of Prenatal Care. She reports morning sickness and cold intolerance.  She has a h/o cryo on her cervix. She had a rapid labor once the scar tissue was disrupted.  Social history Partner/Relationship: married Living situation: lives with husband, toddler, and 2 dogs Work: licensed conveyancer Exercise: prenatal  Substance use: denies   Gynecologic History Patient's last menstrual period was 06/19/2024. Normal Contraception: none Last Pap: 02/2024. 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 Does not want to know gender AFP neg TDaP vaccine   04/17/22 Hgb A1C or  GTT Early : Third trimester : 95 Covid    LAB RESULTS  Rhogam   Blood Type O/Positive/-- (02/16 1426)  Feeding Plan Breast Antibody Negative (02/16 1426) Contraception P    Past Surgical History:  Procedure Laterality Date   LAPAROSCOPY N/A 06/09/2015   Procedure: LAPAROSCOPY DIAGNOSTIC;  Surgeon: Garnette JONETTA Mace, MD;   Location: [facility] ORS;  Service: Gynecology;  Laterality: N/A;   WISDOM TOOTH EXTRACTION      Current Outpatient Medications on File Prior to Visit  Medication Sig Dispense Refill   Prenatal MV-Min-Fe Fum-FA-DHA (PRENATAL 1 PO) Take by mouth.     No current facility-administered medications on file prior to visit.    Allergies  Allergen Reactions   Cefprozil Anaphylaxis, Shortness Of Breath and Swelling   Prednisone Nausea Only    Social History   Socioeconomic History   Marital status: Married    Spouse name: Donnice   Number of children: 1   Years of education: Not on file   Highest education level: Master's degree (e.g., MA, MS, MEng, MEd, MSW, MBA)  Occupational History   Not on file  Tobacco Use   Smoking status: Never   Smokeless tobacco: Never  Vaping Use   Vaping status: Never Used  Substance and Sexual Activity   Alcohol use: Not Currently    Comment: occasional   Drug use: No   Sexual activity: Yes    Birth control/protection: None  Other Topics Concern   Not on file  Social History Narrative   Not on file   Social Drivers of Health   Financial Resource Strain: Low Risk  (08/17/2024)   Overall Financial Resource Strain (CARDIA)  Difficulty of Paying Living Expenses: Not very hard  Food Insecurity: No Food Insecurity (08/17/2024)   Hunger Vital Sign    Worried About Running Out of Food in the Last Year: Never true    Ran Out of Food in the Last Year: Never true  Transportation Needs: No Transportation Needs (08/17/2024)   PRAPARE - Administrator, Civil Service (Medical): No    Lack of Transportation (Non-Medical): No  Physical Activity: Insufficiently Active (08/17/2024)   Exercise Vital Sign    Days of Exercise per Week: 2 days    Minutes of Exercise per Session: 40 min  Stress: No Stress Concern Present (08/17/2024)   [employer] of Occupational Health - Occupational Stress Questionnaire    Feeling of Stress: Not at all   Social Connections: Moderately Integrated (08/17/2024)   Social Connection and Isolation Panel    Frequency of Communication with Friends and Family: More than three times a week    Frequency of Social Gatherings with Friends and Family: Once a week    Attends Religious Services: 1 to 4 times per year    Active Member of [employer] or Organizations: No    Attends Banker Meetings: Never    Marital Status: Married  Catering Manager Violence: Not At Risk (08/17/2024)   Humiliation, Afraid, Rape, and Kick questionnaire    Fear of Current or Ex-Partner: No    Emotionally Abused: No    Physically Abused: No    Sexually Abused: No    Family History  Problem Relation Age of Onset   Endometriosis Mother    Colon cancer Paternal Grandmother    Endometriosis Maternal Grandmother    Melanoma Maternal Grandmother    Diabetes Maternal Grandfather    Diabetes Paternal Grandfather    Heart disease Paternal Grandfather     The following portions of the patient's history were reviewed and updated as appropriate: allergies, current medications, past OB history, past medical history, past surgical history, past family history, past social history, and problem list.  Constitutional: Denied constitutional symptoms, night sweats, recent illness, fatigue, fever, insomnia and weight loss.  Eyes: Denied eye symptoms, eye pain, photophobia, vision change and visual disturbance.  Ears/Nose/Throat/Neck: Denied ear, nose, throat or neck symptoms, hearing loss, nasal discharge, sinus congestion and sore throat.  Cardiovascular: Denied cardiovascular symptoms, arrhythmia, chest pain/pressure, edema, exercise intolerance, orthopnea and palpitations.  Respiratory: Denied pulmonary symptoms, asthma, pleuritic pain, productive sputum, cough, dyspnea and wheezing.  Gastrointestinal: Denied gastro-esophageal reflux, melena, nausea and vomiting.  Genitourinary: Denied genitourinary symptoms including  symptomatic vaginal discharge, pelvic relaxation issues, and urinary complaints.  Musculoskeletal: Denied musculoskeletal symptoms, stiffness, swelling, muscle weakness and myalgia.  Dermatologic: Denied dermatology symptoms, rash and scar.  Neurologic: Denied neurology symptoms, dizziness, headache, neck pain and syncope.  Psychiatric: Denied psychiatric symptoms, anxiety and depression.  Endocrine: Denied endocrine symptoms including hot flashes and night sweats.    Indications for ASA therapy (per uptodate) One of the following: Previous pregnancy with preeclampsia, especially early onset and with an adverse outcome No Multifetal gestation No Chronic hypertension No Type 1 or 2 diabetes mellitus No Chronic kidney disease No Autoimmune disease (antiphospholipid syndrome, systemic lupus erythematosus) No  Two or more of the following: Nulliparity No Obesity (body mass index >30 kg/m2) Yes Family history of preeclampsia in mother or sister No Age >=35 years No Sociodemographic characteristics (African American race, low socioeconomic level) No Personal risk factors (eg, previous pregnancy with low birth weight or small for gestational  age infant, previous adverse pregnancy outcome [eg, stillbirth], interval >10 years between pregnancies) No   OBJECTIVE: Initial Physical Exam (New OB)  GENERAL APPEARANCE: alert, well appearing HEAD: normocephalic, atraumatic MOUTH: mucous membranes moist, pharynx normal without lesions THYROID : no thyromegaly or masses present BREASTS: no masses noted, no significant tenderness, no palpable axillary nodes, no skin changes LUNGS: clear to auscultation, no wheezes, rales or rhonchi, symmetric air entry HEART: regular rate and rhythm, no murmurs ABDOMEN: soft, nontender, nondistended, no abnormal masses, no epigastric pain and FHT present EXTREMITIES: no redness or tenderness in the calves or thighs SKIN: normal coloration and turgor, no  rashes LYMPH NODES: no adenopathy palpable NEUROLOGIC: alert, oriented, normal speech, no focal findings or movement disorder noted  PELVIC EXAM declined  ASSESSMENT: Normal pregnancy [redacted]w[redacted]d   PLAN: Routine prenatal care. We discussed an overview of prenatal care and when to call. Reviewed diet, exercise, and weight gain recommendations in pregnancy. Discussed benefits of breastfeeding and lactation resources at [facility]. I answered all questions. Labs and genetic screening today.  See orders  Eleanor Canny, CNM

## 2024-09-08 ENCOUNTER — Ambulatory Visit (INDEPENDENT_AMBULATORY_CARE_PROVIDER_SITE_OTHER): Admitting: Obstetrics & Gynecology

## 2024-09-08 DIAGNOSIS — Z3490 Encounter for supervision of normal pregnancy, unspecified, unspecified trimester: Secondary | ICD-10-CM
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
## 2024-09-11 ENCOUNTER — Ambulatory Visit (INDEPENDENT_AMBULATORY_CARE_PROVIDER_SITE_OTHER): Admitting: Obstetrics

## 2024-09-11 ENCOUNTER — Other Ambulatory Visit (HOSPITAL_COMMUNITY)
Admission: RE | Admit: 2024-09-11 | Discharge: 2024-09-11 | Disposition: A | Discharge status: Home / Self Care | Source: Ambulatory Visit | Attending: Obstetrics | Admitting: Obstetrics

## 2024-09-11 ENCOUNTER — Encounter: Payer: Self-pay | Admitting: Obstetrics

## 2024-09-11 VITALS — BP 117/75 | HR 76 | Wt 184.0 lb

## 2024-09-11 DIAGNOSIS — D649 Anemia, unspecified: Secondary | ICD-10-CM

## 2024-09-11 DIAGNOSIS — Z0283 Encounter for blood-alcohol and blood-drug test: Secondary | ICD-10-CM

## 2024-09-11 DIAGNOSIS — Z3A12 12 weeks gestation of pregnancy: Secondary | ICD-10-CM | POA: Diagnosis not present

## 2024-09-11 DIAGNOSIS — Z131 Encounter for screening for diabetes mellitus: Secondary | ICD-10-CM | POA: Diagnosis not present

## 2024-09-11 DIAGNOSIS — Z113 Encounter for screening for infections with a predominantly sexual mode of transmission: Secondary | ICD-10-CM | POA: Insufficient documentation

## 2024-09-11 DIAGNOSIS — Z3481 Encounter for supervision of other normal pregnancy, first trimester: Secondary | ICD-10-CM

## 2024-09-11 DIAGNOSIS — Z0184 Encounter for antibody response examination: Secondary | ICD-10-CM | POA: Diagnosis not present

## 2024-09-11 DIAGNOSIS — Z3480 Encounter for supervision of other normal pregnancy, unspecified trimester: Secondary | ICD-10-CM

## 2024-09-11 DIAGNOSIS — Z1379 Encounter for other screening for genetic and chromosomal anomalies: Secondary | ICD-10-CM

## 2024-09-11 DIAGNOSIS — Z13220 Encounter for screening for lipoid disorders: Secondary | ICD-10-CM

## 2024-09-12 LAB — URINALYSIS, ROUTINE W REFLEX MICROSCOPIC
Bilirubin, UA: NEGATIVE
Glucose, UA: NEGATIVE
Ketones, UA: NEGATIVE
Leukocytes,UA: NEGATIVE
Nitrite, UA: NEGATIVE
Protein,UA: NEGATIVE
RBC, UA: NEGATIVE
Specific Gravity, UA: 1.017 (ref 1.005–1.030)
Urobilinogen, Ur: 1 mg/dL (ref 0.2–1.0)
pH, UA: 7.5 (ref 5.0–7.5)

## 2024-09-12 LAB — CBC/D/PLT+RPR+RH+ABO+RUBIGG...
Antibody Screen: NEGATIVE
Basophils Absolute: 0 x10E3/uL (ref 0.0–0.2)
Basos: 0 %
EOS (ABSOLUTE): 0 x10E3/uL (ref 0.0–0.4)
Eos: 0 %
HCV Ab: NONREACTIVE
HIV Screen 4th Generation wRfx: NONREACTIVE
Hematocrit: 37.7 % (ref 34.0–46.6)
Hemoglobin: 12.6 g/dL (ref 11.1–15.9)
Hepatitis B Surface Ag: NEGATIVE
Immature Grans (Abs): 0 x10E3/uL (ref 0.0–0.1)
Immature Granulocytes: 0 %
Lymphocytes Absolute: 1.9 x10E3/uL (ref 0.7–3.1)
Lymphs: 23 %
MCH: 28 pg (ref 26.6–33.0)
MCHC: 33.4 g/dL (ref 31.5–35.7)
MCV: 84 fL (ref 79–97)
Monocytes Absolute: 0.6 x10E3/uL (ref 0.1–0.9)
Monocytes: 7 %
Neutrophils Absolute: 5.8 x10E3/uL (ref 1.4–7.0)
Neutrophils: 70 %
Platelets: 290 x10E3/uL (ref 150–450)
RBC: 4.5 x10E6/uL (ref 3.77–5.28)
RDW: 13.5 % (ref 11.7–15.4)
RPR Ser Ql: NONREACTIVE
Rh Factor: POSITIVE
Rubella Antibodies, IGG: 5.87 {index} (ref >0.99)
Varicella zoster IgG: REACTIVE
WBC: 8.4 x10E3/uL (ref 3.4–10.8)

## 2024-09-12 LAB — COMPREHENSIVE METABOLIC PANEL WITH GFR
ALT: 9 IU/L (ref 0–32)
AST: 13 IU/L (ref 0–40)
Albumin: 4.2 g/dL (ref 4.0–5.0)
Alkaline Phosphatase: 59 IU/L (ref 41–116)
BUN/Creatinine Ratio: 13 (ref 9–23)
BUN: 7 mg/dL (ref 6–20)
Bilirubin Total: 0.4 mg/dL (ref 0.0–1.2)
CO2: 21 mmol/L (ref 20–29)
Calcium: 9.6 mg/dL (ref 8.7–10.2)
Chloride: 99 mmol/L (ref 96–106)
Creatinine, Ser: 0.52 mg/dL — ABNORMAL LOW (ref 0.57–1.00)
Globulin, Total: 2.2 g/dL (ref 1.5–4.5)
Glucose: 77 mg/dL (ref 70–99)
Potassium: 4 mmol/L (ref 3.5–5.2)
Sodium: 135 mmol/L (ref 134–144)
Total Protein: 6.4 g/dL (ref 6.0–8.5)
eGFR: 128 mL/min/1.73 (ref >59)

## 2024-09-12 LAB — HEMOGLOBIN A1C
Est. average glucose Bld gHb Est-mCnc: 88 mg/dL
Hgb A1c MFr Bld: 4.7 % — ABNORMAL LOW (ref 4.8–5.6)

## 2024-09-12 LAB — HCV INTERPRETATION

## 2024-09-12 LAB — TSH PREGNANCY: TSH Pregnancy: 1.09 u[IU]/mL (ref 0.450–4.500)

## 2024-09-13 LAB — URINE CULTURE, OB REFLEX

## 2024-09-13 LAB — CULTURE, OB URINE

## 2024-09-14 LAB — CERVICOVAGINAL ANCILLARY ONLY
Chlamydia: NEGATIVE
Comment: NEGATIVE
Comment: NORMAL
Neisseria Gonorrhea: NEGATIVE

## 2024-09-17 ENCOUNTER — Ambulatory Visit: Payer: Self-pay | Admitting: Obstetrics

## 2024-09-18 LAB — MATERNIT21 PLUS CORE NO GENDER
Fetal Fraction: 23
Result (T21): NEGATIVE
Trisomy 13 (Patau syndrome): NEGATIVE
Trisomy 18 (Edwards syndrome): NEGATIVE
Trisomy 21 (Down syndrome): NEGATIVE

## 2024-09-25 LAB — MONITOR DRUG PROFILE 14(MW)

## 2024-09-25 LAB — NICOTINE SCREEN, URINE

## 2024-10-08 NOTE — Progress Notes (Unsigned)
° ° °  Return Prenatal Note   Subjective   28 y.o. G2P1001 at [redacted]w[redacted]d presents for this follow-up prenatal visit.  Patient Kristen Galloway  Patient reports: doing well, has some nasal congestion that is improving, has felt some movement.  Movement: Present Contractions: Not present  Objective   Flow sheet Vitals: Pulse Rate: 92 BP: 118/74 Fetal Heart Rate (bpm): 155 Total weight gain: 8 oz (0.227 kg)  General Appearance  No acute distress, well appearing, and well nourished Pulmonary   Normal work of breathing Neurologic   Alert and oriented to person, place, and time Psychiatric   Mood and affect within normal limits   Assessment/Plan   Plan  28 y.o. G2P1001 at [redacted]w[redacted]d presents for follow-up OB visit. Reviewed prenatal record including previous visit note.  Supervision of other normal pregnancy, antepartum -traveled to [place], not planning to travel any more  -Anatomy US  ordered  -AFP collected  -TWG 8lbs, WNL  -warning signs reviewed       Orders Placed This Encounter  Procedures   US  OB Comp + 14 Wk    Standing Status:   Future    Expected Date:   10/23/2024    Expiration Date:   10/09/2025    Reason for Exam (SYMPTOM  OR DIAGNOSIS REQUIRED):   Needs anatomy u/s    Preferred Imaging Location?:   Internal             at [facility]   AFP, Serum, Open Spina Bifida    Is patient insulin dependent?:   No    Patient weight (lb.):   183 lb 8 oz (83.2 kg)    Gestational Age (GA), weeks:   31    Date on which patient was at this GA:   10/09/2024    GA Calculation Method:   LMP    GA Date:   03/26/2025    Number of fetuses:   1    Reason for screen:   OTHER             screen    Donor egg?:   N   Return in about 4 weeks (around 11/06/2024) for ROB.   Future Appointments  Date Time Provider Department Center  11/04/2024  3:00 PM [facility] US  1 AOB-[facility]G None  11/06/2024  3:15 PM Swanson, Eleanor HERO, CNM [facility] None     For next visit:  continue with routine  prenatal care     JINNIE HERO [facility], CNM  12/12/20255:27 PM

## 2024-10-09 ENCOUNTER — Encounter: Payer: Self-pay | Admitting: Licensed Practical Nurse

## 2024-10-09 ENCOUNTER — Ambulatory Visit (INDEPENDENT_AMBULATORY_CARE_PROVIDER_SITE_OTHER): Admitting: Licensed Practical Nurse

## 2024-10-09 VITALS — BP 118/74 | HR 92 | Wt 183.5 lb

## 2024-10-09 DIAGNOSIS — Z369 Encounter for antenatal screening, unspecified: Secondary | ICD-10-CM

## 2024-10-09 DIAGNOSIS — Z3A16 16 weeks gestation of pregnancy: Secondary | ICD-10-CM | POA: Diagnosis not present

## 2024-10-09 DIAGNOSIS — Z1379 Encounter for other screening for genetic and chromosomal anomalies: Secondary | ICD-10-CM

## 2024-10-09 DIAGNOSIS — Z3482 Encounter for supervision of other normal pregnancy, second trimester: Secondary | ICD-10-CM | POA: Diagnosis not present

## 2024-10-09 DIAGNOSIS — Z3480 Encounter for supervision of other normal pregnancy, unspecified trimester: Secondary | ICD-10-CM

## 2024-10-09 NOTE — Patient Instructions (Signed)
 Second Trimester of Pregnancy  The second trimester of pregnancy is from week 14 through week 27. This is months 4 through 6 of pregnancy. During the second trimester: Morning sickness is less or has stopped. You may have more energy. You may feel hungry more often. At this time, your unborn baby is growing very fast. At the end of the sixth month, the unborn baby may be up to 12 inches long and weigh about 1 pounds. You will likely start to feel the baby move between 16 and 20 weeks of pregnancy. Body changes during your second trimester Your body continues to change during this time. The changes usually go away after your baby is born. Physical changes You will gain more weight. Your belly will get bigger. You may begin to get stretch marks on your hips, belly, and breasts. Your breasts will keep growing and may hurt. You may get dark spots or blotches on your face. A dark line from your belly button to the pubic area may appear. This line is called linea nigra. Your hair may grow faster and get thicker. Health changes You may have headaches. You may have heartburn. You may pee more often. You may have swollen, bulging veins (varicose veins). You may have trouble pooping (constipation), or swollen veins in the butt that can itch or get painful (hemorrhoids). You may have back pain. This is caused by: Weight gain. Pregnancy hormones that are relaxing the joints in your pelvis. Follow these instructions at home: Medicines Talk to your health care provider if you're taking medicines. Ask if the medicines are safe to take during pregnancy. Your provider may change the medicines that you take. Do not take any medicines unless told to by your provider. Take a prenatal vitamin that has at least 600 micrograms (mcg) of folic acid. Do not use herbal medicines, illegal drugs, or medicines that are not approved by your provider. Eating and drinking While you're pregnant your body needs  extra food for your growing baby. Talk with your provider about what to eat while pregnant. Activity Most women are able to exercise during pregnancy. Exercises may need to change as your pregnancy goes on. Talk to your provider about your activities and exercise routines. Relieving pain and discomfort Wear a good, supportive bra if your breasts hurt. Rest with your legs raised if you have leg cramps or low back pain. Take warm sitz baths to soothe pain from hemorrhoids. Use hemorrhoid cream if your provider says it's okay. Do not douche. Do not use tampons or scented pads. Do not use hot tubs, steam rooms, or saunas. Safety Wear your seatbelt at all times when you're in a car. Talk to your provider if someone hits you, hurts you, or yells at you. Talk with your provider if you're feeling sad or have thoughts of hurting yourself. Lifestyle Certain things can be harmful while you're pregnant. It's best to avoid the following: Do not drink alcohol,smoke, vape, or use products with nicotine  or tobacco in them. If you need help quitting, talk with your provider. Avoid cat litter boxes and soil used by cats. These things carry germs that can cause harm to your pregnancy and your baby. General instructions Keep all follow-up visits. It helps you and your unborn baby stay as healthy as possible. Write down your questions. Take them to your prenatal visits. Your provider will: Talk with you about your overall health. Give you advice or refer you to specialists who can help with different needs,  including: Prenatal education classes. Mental health and counseling. Foods and healthy eating. Ask for help if you need help with food. Where to find more information American Pregnancy Association: americanpregnancy.org [employer] of Obstetricians and Gynecologists: acog.org Office on [employer] Health: TravelLesson.ca Contact a health care provider if: You have a headache that does not go away  when you take medicine. You have any of these problems: You can't eat or drink. You throw up or feel like you may throw up. You have watery poop (diarrhea) for 2 days or more. You have pain when you pee or your pee smells bad. You have been sick for 2 days or more and are not getting better. Contact your provider right away if: You have any of these coming from your vagina: Abnormal discharge. Bad-smelling fluid. Bleeding. Your baby is moving less than usual. You have contractions, belly cramping, or have pain in your pelvis or lower back. You have symptoms of high blood pressure or preeclampsia. These include: A severe, throbbing headache that does not go away. Sudden or extreme swelling of your face, hands, legs, or feet. Vision problems: You see spots. You have blurry vision. Your eyes are sensitive to light. If you can't reach the provider, go to an urgent care or emergency room. Get help right away if: You faint, become confused, or can't think clearly. You have chest pain or trouble breathing. You have any kind of injury, such as from a fall or a car crash. These symptoms may be an emergency. Call 911 right away. Do not wait to see if the symptoms will go away. Do not drive yourself to the hospital. This information is not intended to replace advice given to you by your health care provider. Make sure you discuss any questions you have with your health care provider. Document Revised: 07/18/2023 Document Reviewed: 02/15/2023 Elsevier Patient Education  2024 Elsevier Inc.Alpha-Fetoprotein Test: What to Know Why am I having this test? The alpha-fetoprotein (AFP) test is a blood test used to show if a pregnant person is at risk of carrying a baby with a congenital condition. A congenital condition is a problem that a baby is born with. The AFP test can be combined with other tests to look for these problems in the unborn baby: Genetic problems. Problems with the brain or  spinal cord. Belly problems. This test is used to see if your baby is at risk for having a congenital condition. It doesn't show if your baby actually has the condition. More testing will be needed to know for sure. The AFP test may also be done for males or nonpregnant people to check for certain cancers. What is being tested? This test measures the amount of AFP in your blood. AFP is a protein that's normally found in your blood starting in the 10th week of pregnancy. The level of AFP is highest at 16-18 weeks of pregnancy. AFP testing can be done between 15 and 20 weeks of pregnancy, but 16 to 18 weeks of pregnancy is the best time to do it. Certain cancers can cause a high level of AFP in both males and females. What kind of sample is taken?  A blood sample is needed for this test. It's usually taken by putting a needle into a blood vessel. How are the results reported? Your results will be compared with normal results for this test. Each lab has its own range for what's normal. Most lab reports include the normal ranges for each test and a  note telling you if yours is high or low. Normal results for the AFP test tend to be: Adult: Less than 40 ng/mL or less than 40 mcg/L (SI units). Child younger than 1 year: Less than 30 ng/mL. If you're pregnant, the values will change based on how far along you are. What do the results mean? If you're pregnant and your results are lower than expected, it can mean that your due date isn't right or that your baby may have trisomy 61, also known as Down syndrome. If you're pregnant and your results are higher than expected, it may mean: You're pregnant with more than one baby. Your due date is not right. Your baby may have: A problem with the wall of the belly. A problem with the spinal cord or brain. Your baby isn't getting enough oxygen, or the heart rate is too fast or too slow. Your baby has died before being born. Results that are higher than  expected in males or nonpregnant females may indicate: Cancer. Liver cell death. Talk with your health care provider about what your results mean. AFP testing is not used alone. Other testing will be needed. Questions to ask your health care provider Ask your provider, or the department that's doing the test: When will my results be ready? How will I get my results? What other tests do I need? What are my next steps? This information is not intended to replace advice given to you by your health care provider. Make sure you discuss any questions you have with your health care provider. Document Revised: 08/28/2023 Document Reviewed: 08/28/2023 Elsevier Patient Education  2025 [employer].

## 2024-10-09 NOTE — Assessment & Plan Note (Addendum)
-  traveled to [place], not planning to travel any more  -Anatomy US  ordered  -AFP collected  -TWG 8lbs, WNL  -warning signs reviewed

## 2024-10-14 LAB — AFP, SERUM, OPEN SPINA BIFIDA
AFP MoM: 1.2
AFP Value: 34.6 ng/mL
Gest. Age on Collection Date: 16 wk
Maternal Age At EDD: 30.6 a
OSBR Risk 1 IN: 6499
Test Results:: NEGATIVE
Weight: 183 [lb_av]

## 2024-11-04 ENCOUNTER — Ambulatory Visit

## 2024-11-04 DIAGNOSIS — Z369 Encounter for antenatal screening, unspecified: Secondary | ICD-10-CM | POA: Diagnosis not present

## 2024-11-04 DIAGNOSIS — Z3482 Encounter for supervision of other normal pregnancy, second trimester: Secondary | ICD-10-CM

## 2024-11-04 DIAGNOSIS — Z3480 Encounter for supervision of other normal pregnancy, unspecified trimester: Secondary | ICD-10-CM

## 2024-11-04 DIAGNOSIS — Z3A19 19 weeks gestation of pregnancy: Secondary | ICD-10-CM | POA: Diagnosis not present

## 2024-11-06 ENCOUNTER — Ambulatory Visit (INDEPENDENT_AMBULATORY_CARE_PROVIDER_SITE_OTHER): Admitting: Obstetrics

## 2024-11-06 ENCOUNTER — Encounter: Payer: Self-pay | Admitting: Obstetrics

## 2024-11-06 VITALS — BP 108/68 | HR 74 | Wt 188.0 lb

## 2024-11-06 DIAGNOSIS — N83201 Unspecified ovarian cyst, right side: Secondary | ICD-10-CM | POA: Insufficient documentation

## 2024-11-06 DIAGNOSIS — Z3480 Encounter for supervision of other normal pregnancy, unspecified trimester: Secondary | ICD-10-CM

## 2024-11-06 DIAGNOSIS — Z3689 Encounter for other specified antenatal screening: Secondary | ICD-10-CM | POA: Diagnosis not present

## 2024-11-06 NOTE — Assessment & Plan Note (Signed)
-  Discussed that ovarian cysts often do not cause problems in pregnancy. However, there is the risk of torsion or severe pain. Instructed to present to ED with severe pain. -Growth US  for ~28 weeks

## 2024-11-06 NOTE — Assessment & Plan Note (Signed)
-  Anatomy US  normal per preliminary report except for ovarian cyst -Reviewed danger signs and when to seek medical attention

## 2024-11-06 NOTE — Progress Notes (Signed)
" ° ° °  Return Prenatal Note   Assessment/Plan   Plan  29 y.o. G2P1001 at [redacted]w[redacted]d presents for follow-up OB visit. Reviewed prenatal record including previous visit note.  Right ovarian cyst -Discussed that ovarian cysts often do not cause problems in pregnancy. However, there is the risk of torsion or severe pain. Instructed to present to ED with severe pain. -Growth US  for ~28 weeks  Supervision of other normal pregnancy, antepartum -Anatomy US  normal per preliminary report except for ovarian cyst -Reviewed danger signs and when to seek medical attention   Orders Placed This Encounter  Procedures   US  OB Follow Up    Standing Status:   Future    Expected Date:   01/04/2025    Expiration Date:   11/06/2025    Reason for Exam (SYMPTOM  OR DIAGNOSIS REQUIRED):   growth, ovarian cyst    Preferred Imaging Location?:   Internal    Call Results- Best Contact Number?:   growth, assess right ovarian cyst   Return in about 4 weeks (around 12/04/2024).   Future Appointments  Date Time Provider Department Center  12/04/2024  8:15 AM Justino Eleanor HERO, CNM [facility] None    For next visit:  Routine prenatal care    Subjective   Nona's appetite is improving but is not consistent. She has questions about the cyst seen on her US . She is not having pain from it but definitely feels discomfort. She has started leaking colostrum.  Movement: Present Contractions: Not present  Objective   Flow sheet Vitals: Pulse Rate: 74 BP: 108/68 Fundal Height: 21 cm Fetal Heart Rate (bpm): 148 Total weight gain: 5 lb (2.268 kg)  General Appearance  No acute distress, well appearing, and well nourished Pulmonary   Normal work of breathing Neurologic   Alert and oriented to person, place, and time Psychiatric   Mood and affect within normal limits  Eleanor Justino, CNM 11/06/2024 4:00 PM  "

## 2024-11-12 ENCOUNTER — Observation Stay: Admission: EM | Admit: 2024-11-12 | Discharge: 2024-11-12 | Disposition: A | Discharge status: Home / Self Care

## 2024-11-12 ENCOUNTER — Telehealth: Payer: Self-pay

## 2024-11-12 ENCOUNTER — Other Ambulatory Visit: Payer: Self-pay

## 2024-11-12 ENCOUNTER — Telehealth: Payer: Self-pay | Admitting: Licensed Practical Nurse

## 2024-11-12 DIAGNOSIS — Z3A20 20 weeks gestation of pregnancy: Secondary | ICD-10-CM | POA: Diagnosis not present

## 2024-11-12 DIAGNOSIS — R1031 Right lower quadrant pain: Secondary | ICD-10-CM

## 2024-11-12 DIAGNOSIS — M545 Low back pain, unspecified: Secondary | ICD-10-CM | POA: Diagnosis not present

## 2024-11-12 DIAGNOSIS — O99891 Other specified diseases and conditions complicating pregnancy: Secondary | ICD-10-CM | POA: Diagnosis not present

## 2024-11-12 DIAGNOSIS — R109 Unspecified abdominal pain: Principal | ICD-10-CM | POA: Diagnosis present

## 2024-11-12 DIAGNOSIS — O26892 Other specified pregnancy related conditions, second trimester: Principal | ICD-10-CM | POA: Insufficient documentation

## 2024-11-12 LAB — URINALYSIS, ROUTINE W REFLEX MICROSCOPIC
Bilirubin Urine: NEGATIVE
Glucose, UA: NEGATIVE mg/dL
Hgb urine dipstick: NEGATIVE
Ketones, ur: NEGATIVE mg/dL
Leukocytes,Ua: NEGATIVE
Nitrite: NEGATIVE
Protein, ur: NEGATIVE mg/dL
Specific Gravity, Urine: 1.012 (ref 1.005–1.030)
pH: 6 (ref 5.0–8.0)

## 2024-11-12 NOTE — Telephone Encounter (Signed)
 Called [facility] Calling in regards to your call earlier, please go to the ED if the pain is severe, causes you to vomit, you are unable to get out of one position, or it wakes you up at night Jinnie Cookey, CNM  [facility] 11/13/23 5:26 PM

## 2024-11-12 NOTE — Final Progress Note (Signed)
 "    OB/Triage Note  Patient ID: Kristen Galloway MRN: [id number] DOB/AGE: 06-Jun-1996 28 y.o.  Subjective  History of Present Illness: The patient is a 29 y.o. female G2P1001 at [redacted]w[redacted]d who presents for lower right sided abdominal pain and low back pain. She also notes muscle pain when using the bathroom but denies burning. She admits nausea but denies vomiting. She reports good fetal movement and denies vaginal bleeding, ctx's, and lof.   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 Does not want to know gender AFP neg TDaP vaccine   04/17/22 Hgb A1C or  GTT Early : Third trimester : 95 Covid    LAB RESULTS  Rhogam   Blood Type O/Positive/-- (02/16 1426)  Feeding Plan Breast Antibody Negative (02/16 1426) Contraception P    Past Surgical History:  Procedure Laterality Date   LAPAROSCOPY N/A 06/09/2015   Procedure: LAPAROSCOPY DIAGNOSTIC;  Surgeon: Garnette JONETTA Mace, MD;  Location: [facility] ORS;  Service: Gynecology;  Laterality: N/A;   WISDOM TOOTH EXTRACTION      Medications Ordered Prior to Encounter[1]  Allergies[2]  Social History   Socioeconomic History   Marital status: Married    Spouse name: Donnice   Number of children: 1   Years of education: Not on file   Highest education level: Master's degree (e.g., MA, MS, MEng, MEd, MSW, MBA)  Occupational History   Not on file  Tobacco Use   Smoking status: Never   Smokeless tobacco: Never  Vaping Use   Vaping status: Never  Used  Substance and Sexual Activity   Alcohol use: Not Currently    Comment: occasional   Drug use: No   Sexual activity: Yes    Birth control/protection: None  Other Topics Concern   Not on file  Social History Narrative   Not on file   Social Drivers of Health   Tobacco Use: Low Risk (11/12/2024)   Patient History    Smoking Tobacco Use: Never    Smokeless Tobacco Use: Never    Passive Exposure: Not on file  Financial Resource Strain: Low Risk (08/17/2024)   Overall Financial Resource Strain (CARDIA)    Difficulty of Paying Living Expenses: Not very hard  Food Insecurity: No Food Insecurity (08/17/2024)   Epic    Worried About Programme Researcher, Broadcasting/film/video in the Last Year: Never true    Ran Out of Food in the Last Year: Never true  Transportation Needs: No Transportation Needs (08/17/2024)   Epic    Lack of Transportation (Medical): No    Lack of Transportation (Non-Medical): No  Physical Activity: Insufficiently Active (08/17/2024)   Exercise Vital Sign    Days of Exercise per Week: 2 days    Minutes of Exercise per Session: 40 min  Stress: No Stress Concern Present (08/17/2024)   [employer] of Occupational Health - Occupational Stress Questionnaire    Feeling of Stress: Not at  all  Social Connections: Moderately Integrated (08/17/2024)   Social Connection and Isolation Panel    Frequency of Communication with Friends and Family: More than three times a week    Frequency of Social Gatherings with Friends and Family: Once a week    Attends Religious Services: 1 to 4 times per year    Active Member of [employer] or Organizations: No    Attends Banker Meetings: Never    Marital Status: Married  Catering Manager Violence: Not At Risk (08/17/2024)   Epic    Fear of Current or Ex-Partner: No    Emotionally Abused: No    Physically Abused: No    Sexually Abused: No  Depression (PHQ2-9): Low Risk (08/17/2024)   Depression (PHQ2-9)    PHQ-2 Score: 0  Alcohol  Screen: Low Risk (08/17/2024)   Alcohol Screen    Last Alcohol Screening Score (AUDIT): 0  Housing: Low Risk (08/17/2024)   Epic    Unable to Pay for Housing in the Last Year: No    Number of Times Moved in the Last Year: 0    Homeless in the Last Year: No  Utilities: Not At Risk (08/17/2024)   Epic    Threatened with loss of utilities: No  Health Literacy: Adequate Health Literacy (08/17/2024)   B1300 Health Literacy    Frequency of need for help with medical instructions: Never    Family History  Problem Relation Age of Onset   Endometriosis Mother    Colon cancer Paternal Grandmother    Endometriosis Maternal Grandmother    Melanoma Maternal Grandmother    Diabetes Maternal Grandfather    Diabetes Paternal Grandfather    Heart disease Paternal Grandfather      ROS    Objective  Physical Exam: BP 115/62 (BP Location: Left Arm)   Pulse 84   Temp 98.4 F (36.9 C) (Oral)   Resp 16   LMP 06/19/2024   OBGyn Exam  FHT 142 via doppler Toco: no contractions detected  Significant Findings/ Diagnostic Studies: UA negative   Hospital Course: The patient was admitted to [facility] Triage for observation. Patient was most worried about ovarian torsion. She had her anatomy scan last week and had been told she had a cyst on her right ovary and to watch for right sided pain. Pain is not severe. No contractions detected, no s/sx of UTI with neg UA. Unsure of cause of pain, possible round ligament pain or referred constipation pain. Pain not significant enough for torsion, s/sx of torsion discussed. Patient and partner felt reassured and comfortable going home.  Assessment: 29 y.o. female G2P1001 at [redacted]w[redacted]d  Right lower abd pain- suspect right ligament pain  Plan: Discharge home: take tylenol , warm baths Follow up at next ROB or sooner as needed  Discharge Instructions     Discharge activity:  No Restrictions   Complete by: As directed    Discharge diet:  No restrictions   Complete  by: As directed    Discharge instructions   Complete by: As directed    Follow up with your OB provider at your next ROB   No sexual activity restrictions   Complete by: As directed    Notify physician for a general feeling that something is not right   Complete by: As directed    Notify physician for increase or change in vaginal discharge   Complete by: As directed    Notify physician for intestinal cramps, with or without diarrhea, sometimes described as gas  pain   Complete by: As directed    Notify physician for leaking of fluid   Complete by: As directed    Notify physician for low, dull backache, unrelieved by heat or Tylenol    Complete by: As directed    Notify physician for menstrual like cramps   Complete by: As directed    Notify physician for pelvic pressure   Complete by: As directed    Notify physician for uterine contractions.  These may be painless and feel like the uterus is tightening or the baby is  balling up   Complete by: As directed    Notify physician for vaginal bleeding   Complete by: As directed    PRETERM LABOR:  Includes any of the follwing symptoms that occur between 20 - [redacted] weeks gestation.  If these symptoms are not stopped, preterm labor can result in preterm delivery, placing your baby at risk   Complete by: As directed       Allergies as of 11/12/2024       Reactions   Cefprozil Anaphylaxis, Shortness Of Breath, Swelling   Prednisone Nausea Only        Medication List     TAKE these medications    PRENATAL 1 PO Take by mouth.         Total time spent taking care of this patient: 30 minutes  Signed: Lolita Loots CNM, FNP 11/12/2024, 7:31 PM     [1]  No current facility-administered medications on file prior to encounter.   Current Outpatient Medications on File Prior to Encounter  Medication Sig Dispense Refill   Prenatal MV-Min-Fe Fum-FA-DHA (PRENATAL 1 PO) Take by mouth.    [2]  Allergies Allergen Reactions    Cefprozil Anaphylaxis, Shortness Of Breath and Swelling   Prednisone Nausea Only   "

## 2024-11-12 NOTE — Telephone Encounter (Signed)
 Patient contacted office with concerns of results of recent ultrasound done on 11/04/24. Patient reports that ultrasound showed a cyst, patient states that she was given precautionary signs to look out for when to go to hospital but is wanting to know if she should go to ER today since pain has been persistent for the past 2 hours RLQ. Patient denies difficulty walking , nausea, vomiting, cramping, bleeding or back pain. When I asked patient has she tried anything for pain relief such as Tylenol  to alleviate symptoms she said no.Advised patient to try tylenol  to help with symptom relief and resting in bed with legs elevated and decreasing strenuous activity for the day , advised patient that if any symptoms that I mentioned above occurred or if pain persisted after use of otc tylenol  then to go to ED for evaluation. KW

## 2024-11-12 NOTE — OB Triage Note (Signed)
 Pt being discharged home by M. Duwayne, CNM. Pt understands plan. Pt knows to return for LOF, increased pain and vaginal bleeding. Discharge instructions handed to pt and pt verbalized understanding. Leaving ambulatory with SO.

## 2024-11-12 NOTE — OB Triage Note (Signed)
 29 y.o G2P1 presents to L&D triage at [redacted]w[redacted]d c/o lower right sided abdominal pain and low back pain. She also notes muscle pain when using the bathroom but denies burning. She admits nausea but denies vomiting. She reports good fetal movement and denies vaginal bleeding, ctx's, and lof. Duwayne, CNM aware of pt arrival. Initial vitals wnl, toco applied, UA sent down. Initial fetal heart tone was 142.

## 2024-12-03 NOTE — Progress Notes (Unsigned)
" ° ° °  Return Prenatal Note   Assessment/Plan   Plan  29 y.o. G2P1001 at [redacted]w[redacted]d presents for follow-up OB visit. Reviewed prenatal record including previous visit note.  Right ovarian cyst -[facility] declines US  today. Prefers to f/u with growth US  ~28 weeks -Reviewed danger signs and when to seek emergency care  Supervision of other normal pregnancy, antepartum -Discussed 28-week labs at next visit -Reviewed kick counts and preterm labor warning signs. Instructed to call office or come to hospital with persistent headache, vision changes, regular contractions, leaking of fluid, decreased fetal movement or vaginal bleeding.      Orders Placed This Encounter  Procedures   28 Week RH+Panel    Standing Status:   Future    Expected Date:   12/31/2024    Expiration Date:   12/03/2025   No follow-ups on file.   Future Appointments  Date Time Provider Department Center  12/30/2024  9:00 AM AOB-OBGYN LAB [facility] None  12/30/2024  9:15 AM [facility]OB-A[facility] US  1 AOB-IMG None  12/30/2024 10:15 AM Sebastian Sham, CNM AOB-AOB None     For next visit:  ROB with 28-week labs and TDaP    Subjective   Kristen Galloway is having continued pain on her right side but does not feel it is different from her usual pain. She did have an episode of severe pain on 11/12/24 and was seen in triage. She is frustrated with her experience there and felt that her pain was dismissed. The pain eventually resolved over several days of rest. She denies bleeding and LOF.   Movement: Present Contractions: Irritability  Objective   Flow sheet Vitals: Pulse Rate: 86 BP: 115/75 Fundal Height: 25 cm Fetal Heart Rate (bpm): 152 Total weight gain: 9 lb (4.082 kg)  General Appearance  No acute distress, well appearing, and well nourished Pulmonary   Normal work of breathing Neurologic   Alert and oriented to person, place, and time Psychiatric   Mood and affect within normal limits  Eleanor Canny, CNM 12/04/24 10:54 AM  "

## 2024-12-04 ENCOUNTER — Ambulatory Visit: Admitting: Obstetrics

## 2024-12-04 ENCOUNTER — Encounter: Admitting: Obstetrics

## 2024-12-04 VITALS — BP 115/75 | HR 86 | Wt 192.0 lb

## 2024-12-04 DIAGNOSIS — Z113 Encounter for screening for infections with a predominantly sexual mode of transmission: Secondary | ICD-10-CM

## 2024-12-04 DIAGNOSIS — Z131 Encounter for screening for diabetes mellitus: Secondary | ICD-10-CM

## 2024-12-04 DIAGNOSIS — Z3480 Encounter for supervision of other normal pregnancy, unspecified trimester: Secondary | ICD-10-CM

## 2024-12-04 DIAGNOSIS — Z130 Encounter for screening for diseases of the blood and blood-forming organs and certain disorders involving the immune mechanism: Secondary | ICD-10-CM

## 2024-12-04 DIAGNOSIS — N83201 Unspecified ovarian cyst, right side: Secondary | ICD-10-CM

## 2024-12-04 NOTE — Assessment & Plan Note (Addendum)
-  Kristen Galloway declines US  today. Prefers to f/u with growth US  ~28 weeks -Reviewed danger signs and when to seek emergency care

## 2024-12-04 NOTE — Assessment & Plan Note (Signed)
-  Discussed 28-week labs at next visit -Reviewed kick counts and preterm labor warning signs. Instructed to call office or come to hospital with persistent headache, vision changes, regular contractions, leaking of fluid, decreased fetal movement or vaginal bleeding.

## 2024-12-30 ENCOUNTER — Encounter: Admitting: Certified Nurse Midwife

## 2024-12-30 ENCOUNTER — Other Ambulatory Visit
